# Patient Record
Sex: Male | Born: 1955 | Race: White | Hispanic: No | Marital: Married | Smoking: Never smoker
Health system: Southern US, Community
[De-identification: ages and names within clinical notes are randomized; demographics above are authoritative.]

## PROBLEM LIST (undated history)

## (undated) DIAGNOSIS — R7303 Prediabetes: Secondary | ICD-10-CM

## (undated) DIAGNOSIS — R0600 Dyspnea, unspecified: Secondary | ICD-10-CM

## (undated) DIAGNOSIS — N4340 Spermatocele of epididymis, unspecified: Secondary | ICD-10-CM

## (undated) DIAGNOSIS — N400 Enlarged prostate without lower urinary tract symptoms: Secondary | ICD-10-CM

## (undated) DIAGNOSIS — E785 Hyperlipidemia, unspecified: Secondary | ICD-10-CM

## (undated) DIAGNOSIS — N329 Bladder disorder, unspecified: Secondary | ICD-10-CM

## (undated) DIAGNOSIS — I10 Essential (primary) hypertension: Secondary | ICD-10-CM

## (undated) DIAGNOSIS — D649 Anemia, unspecified: Secondary | ICD-10-CM

## (undated) DIAGNOSIS — R918 Other nonspecific abnormal finding of lung field: Secondary | ICD-10-CM

## (undated) DIAGNOSIS — K219 Gastro-esophageal reflux disease without esophagitis: Secondary | ICD-10-CM

## (undated) DIAGNOSIS — E039 Hypothyroidism, unspecified: Secondary | ICD-10-CM

## (undated) DIAGNOSIS — M1990 Unspecified osteoarthritis, unspecified site: Secondary | ICD-10-CM

## (undated) HISTORY — DX: Hypothyroidism, unspecified: E03.9

## (undated) HISTORY — DX: Spermatocele of epididymis, unspecified: N43.40

## (undated) HISTORY — DX: Benign prostatic hyperplasia without lower urinary tract symptoms: N40.0

## (undated) HISTORY — DX: Hyperlipidemia, unspecified: E78.5

## (undated) HISTORY — DX: Gastro-esophageal reflux disease without esophagitis: K21.9

## (undated) HISTORY — PX: CATARACT EXTRACTION: SUR2

---

## 1981-09-01 DIAGNOSIS — E039 Hypothyroidism, unspecified: Secondary | ICD-10-CM

## 1981-09-01 HISTORY — DX: Hypothyroidism, unspecified: E03.9

## 1987-08-13 ENCOUNTER — Encounter: Payer: Self-pay | Admitting: Family Medicine

## 1987-08-13 LAB — CONVERTED CEMR LAB: Blood Glucose, Fasting: 97 mg/dL

## 1988-06-24 ENCOUNTER — Encounter: Payer: Self-pay | Admitting: Family Medicine

## 1988-06-24 LAB — CONVERTED CEMR LAB
Blood Glucose, Fasting: 105 mg/dL
RBC count: 5.57 10*6/uL
WBC, blood: 6.2 10*3/uL

## 1989-05-14 ENCOUNTER — Encounter: Payer: Self-pay | Admitting: Family Medicine

## 1989-05-14 LAB — CONVERTED CEMR LAB
Blood Glucose, Fasting: 102 mg/dL
RBC count: 5.42 10*6/uL
TSH: 1.42 microintl units/mL
WBC, blood: 5.3 10*3/uL

## 1989-08-01 DIAGNOSIS — N400 Enlarged prostate without lower urinary tract symptoms: Secondary | ICD-10-CM

## 1989-08-01 HISTORY — DX: Benign prostatic hyperplasia without lower urinary tract symptoms: N40.0

## 1989-08-01 HISTORY — PX: CYSTOSCOPY: SUR368

## 1990-08-30 ENCOUNTER — Encounter: Payer: Self-pay | Admitting: Family Medicine

## 1990-08-30 LAB — CONVERTED CEMR LAB
RBC count: 5.56 10*6/uL
WBC, blood: 5.1 10*3/uL

## 1990-08-31 ENCOUNTER — Encounter: Payer: Self-pay | Admitting: Family Medicine

## 1990-08-31 LAB — CONVERTED CEMR LAB
Blood Glucose, Fasting: 88 mg/dL
TSH: 0.68 microintl units/mL

## 1991-10-31 LAB — HM SIGMOIDOSCOPY: HM Sigmoidoscopy: NORMAL

## 1993-08-08 ENCOUNTER — Encounter: Payer: Self-pay | Admitting: Family Medicine

## 1993-08-08 LAB — CONVERTED CEMR LAB
Blood Glucose, Fasting: 101 mg/dL
RBC count: 5.67 10*6/uL
TSH: 0.1 microintl units/mL
WBC, blood: 5 10*3/uL

## 1995-04-20 ENCOUNTER — Encounter: Payer: Self-pay | Admitting: Family Medicine

## 1995-04-20 LAB — CONVERTED CEMR LAB: Blood Glucose, Fasting: 105 mg/dL

## 1995-04-22 ENCOUNTER — Encounter: Payer: Self-pay | Admitting: Family Medicine

## 1995-04-22 LAB — CONVERTED CEMR LAB: PSA: 1 ng/mL

## 1997-08-01 DIAGNOSIS — E785 Hyperlipidemia, unspecified: Secondary | ICD-10-CM

## 1997-08-01 HISTORY — DX: Hyperlipidemia, unspecified: E78.5

## 1997-08-31 ENCOUNTER — Encounter: Payer: Self-pay | Admitting: Family Medicine

## 1997-08-31 LAB — CONVERTED CEMR LAB: TSH: 2.79 microintl units/mL

## 1998-11-27 ENCOUNTER — Encounter: Payer: Self-pay | Admitting: Family Medicine

## 1998-11-27 LAB — CONVERTED CEMR LAB
Blood Glucose, Fasting: 98 mg/dL
TSH: 1.15 microintl units/mL

## 1998-11-29 DIAGNOSIS — K219 Gastro-esophageal reflux disease without esophagitis: Secondary | ICD-10-CM

## 1998-11-29 HISTORY — DX: Gastro-esophageal reflux disease without esophagitis: K21.9

## 1999-10-31 ENCOUNTER — Other Ambulatory Visit: Admission: RE | Admit: 1999-10-31 | Discharge: 1999-10-31 | Payer: Self-pay | Admitting: Gastroenterology

## 1999-10-31 ENCOUNTER — Encounter (INDEPENDENT_AMBULATORY_CARE_PROVIDER_SITE_OTHER): Payer: Self-pay | Admitting: Specialist

## 1999-10-31 HISTORY — PX: ESOPHAGOGASTRODUODENOSCOPY: SHX1529

## 2000-01-08 ENCOUNTER — Encounter: Payer: Self-pay | Admitting: Family Medicine

## 2000-01-08 LAB — CONVERTED CEMR LAB
Blood Glucose, Fasting: 101 mg/dL
TSH: 0.19 microintl units/mL

## 2000-06-04 ENCOUNTER — Encounter: Payer: Self-pay | Admitting: Family Medicine

## 2000-06-04 LAB — CONVERTED CEMR LAB: TSH: 0.77 microintl units/mL

## 2000-08-01 HISTORY — PX: OTHER SURGICAL HISTORY: SHX169

## 2000-08-20 ENCOUNTER — Encounter: Payer: Self-pay | Admitting: Family Medicine

## 2000-08-20 LAB — CONVERTED CEMR LAB
RBC count: 5.36 10*6/uL
WBC, blood: 6.8 10*3/uL

## 2000-08-21 ENCOUNTER — Encounter: Payer: Self-pay | Admitting: Neurosurgery

## 2000-08-21 ENCOUNTER — Ambulatory Visit (HOSPITAL_COMMUNITY): Admission: RE | Admit: 2000-08-21 | Discharge: 2000-08-21 | Payer: Self-pay | Admitting: Neurosurgery

## 2001-05-06 ENCOUNTER — Encounter: Payer: Self-pay | Admitting: Family Medicine

## 2001-05-06 LAB — CONVERTED CEMR LAB
Blood Glucose, Fasting: 98 mg/dL
TSH: 0.7 microintl units/mL

## 2001-09-01 HISTORY — PX: OTHER SURGICAL HISTORY: SHX169

## 2002-05-17 ENCOUNTER — Encounter: Payer: Self-pay | Admitting: Family Medicine

## 2002-05-17 LAB — CONVERTED CEMR LAB
Blood Glucose, Fasting: 99 mg/dL
TSH: 1.18 microintl units/mL

## 2003-03-02 HISTORY — PX: CATARACT EXTRACTION: SUR2

## 2003-04-12 HISTORY — PX: LASIK: SHX215

## 2003-07-04 ENCOUNTER — Encounter: Payer: Self-pay | Admitting: Family Medicine

## 2003-07-04 LAB — CONVERTED CEMR LAB
Blood Glucose, Fasting: 116 mg/dL
TSH: 1.16 microintl units/mL

## 2004-07-02 ENCOUNTER — Ambulatory Visit: Payer: Self-pay | Admitting: Family Medicine

## 2004-07-22 ENCOUNTER — Ambulatory Visit: Payer: Self-pay | Admitting: Family Medicine

## 2004-07-22 LAB — CONVERTED CEMR LAB
Blood Glucose, Fasting: 110 mg/dL
PSA: 0.94 ng/mL
RBC count: 5.58 10*6/uL
TSH: 3.28 microintl units/mL
WBC, blood: 5.4 10*3/uL

## 2004-08-05 ENCOUNTER — Ambulatory Visit: Payer: Self-pay | Admitting: Family Medicine

## 2004-08-14 ENCOUNTER — Ambulatory Visit: Payer: Self-pay | Admitting: Family Medicine

## 2004-10-30 ENCOUNTER — Ambulatory Visit: Payer: Self-pay | Admitting: Family Medicine

## 2005-03-05 ENCOUNTER — Ambulatory Visit: Payer: Self-pay | Admitting: Family Medicine

## 2005-03-07 ENCOUNTER — Ambulatory Visit: Payer: Self-pay | Admitting: Family Medicine

## 2005-07-04 ENCOUNTER — Ambulatory Visit: Payer: Self-pay | Admitting: Family Medicine

## 2005-11-07 ENCOUNTER — Ambulatory Visit: Payer: Self-pay | Admitting: Family Medicine

## 2005-11-18 ENCOUNTER — Ambulatory Visit: Payer: Self-pay | Admitting: Family Medicine

## 2005-11-21 ENCOUNTER — Ambulatory Visit: Payer: Self-pay | Admitting: Family Medicine

## 2006-07-21 ENCOUNTER — Ambulatory Visit: Payer: Self-pay | Admitting: Family Medicine

## 2006-07-21 LAB — CONVERTED CEMR LAB
Blood Glucose, Fasting: 110 mg/dL
PSA: 1.01 ng/mL
TSH: 1.47 microintl units/mL

## 2006-07-24 ENCOUNTER — Ambulatory Visit: Payer: Self-pay | Admitting: Family Medicine

## 2006-11-24 ENCOUNTER — Ambulatory Visit: Payer: Self-pay | Admitting: Family Medicine

## 2007-01-15 ENCOUNTER — Ambulatory Visit: Payer: Self-pay | Admitting: Family Medicine

## 2007-06-02 ENCOUNTER — Encounter: Payer: Self-pay | Admitting: Family Medicine

## 2007-06-24 ENCOUNTER — Ambulatory Visit: Payer: Self-pay | Admitting: Family Medicine

## 2007-09-08 ENCOUNTER — Encounter: Payer: Self-pay | Admitting: Family Medicine

## 2007-09-22 ENCOUNTER — Encounter: Payer: Self-pay | Admitting: Family Medicine

## 2007-09-22 DIAGNOSIS — R7309 Other abnormal glucose: Secondary | ICD-10-CM | POA: Insufficient documentation

## 2007-09-22 DIAGNOSIS — L408 Other psoriasis: Secondary | ICD-10-CM | POA: Insufficient documentation

## 2007-09-23 DIAGNOSIS — K219 Gastro-esophageal reflux disease without esophagitis: Secondary | ICD-10-CM | POA: Insufficient documentation

## 2007-09-23 DIAGNOSIS — E785 Hyperlipidemia, unspecified: Secondary | ICD-10-CM | POA: Insufficient documentation

## 2007-09-23 DIAGNOSIS — E039 Hypothyroidism, unspecified: Secondary | ICD-10-CM | POA: Insufficient documentation

## 2007-09-23 DIAGNOSIS — N400 Enlarged prostate without lower urinary tract symptoms: Secondary | ICD-10-CM | POA: Insufficient documentation

## 2007-12-08 ENCOUNTER — Ambulatory Visit: Payer: Self-pay | Admitting: Family Medicine

## 2007-12-16 ENCOUNTER — Ambulatory Visit: Payer: Self-pay | Admitting: Family Medicine

## 2007-12-22 ENCOUNTER — Ambulatory Visit: Payer: Self-pay | Admitting: Family Medicine

## 2007-12-29 ENCOUNTER — Ambulatory Visit: Payer: Self-pay | Admitting: Family Medicine

## 2008-01-03 ENCOUNTER — Ambulatory Visit: Payer: Self-pay | Admitting: Family Medicine

## 2008-01-03 LAB — CONVERTED CEMR LAB
ALT: 32 units/L (ref 0–53)
AST: 30 units/L (ref 0–37)
Albumin: 3.8 g/dL (ref 3.5–5.2)
Alkaline Phosphatase: 92 units/L (ref 39–117)
BUN: 15 mg/dL (ref 6–23)
Bilirubin, Direct: 0.1 mg/dL (ref 0.0–0.3)
CO2: 30 meq/L (ref 19–32)
Calcium: 9.6 mg/dL (ref 8.4–10.5)
Chloride: 106 meq/L (ref 96–112)
Cholesterol: 124 mg/dL (ref 0–200)
Creatinine, Ser: 1 mg/dL (ref 0.4–1.5)
Creatinine,U: 154.7 mg/dL
GFR calc Af Amer: 100 mL/min
GFR calc non Af Amer: 83 mL/min
Glucose, Bld: 112 mg/dL — ABNORMAL HIGH (ref 70–99)
HDL: 30.5 mg/dL — ABNORMAL LOW (ref >39.0)
LDL Cholesterol: 66 mg/dL (ref 0–99)
Microalb Creat Ratio: 1.3 mg/g (ref 0.0–30.0)
Microalb, Ur: 0.2 mg/dL (ref 0.0–1.9)
PSA: 1.63 ng/mL (ref 0.10–4.00)
Potassium: 4.1 meq/L (ref 3.5–5.1)
Sodium: 141 meq/L (ref 135–145)
T4, Total: 6 ug/dL (ref 5.0–12.5)
TSH: 1.89 microintl units/mL (ref 0.35–5.50)
Total Bilirubin: 0.8 mg/dL (ref 0.3–1.2)
Total CHOL/HDL Ratio: 4.1
Total Protein: 6.9 g/dL (ref 6.0–8.3)
Triglycerides: 136 mg/dL (ref 0–149)
VLDL: 27 mg/dL (ref 0–40)

## 2008-01-05 ENCOUNTER — Ambulatory Visit: Payer: Self-pay | Admitting: Family Medicine

## 2008-01-12 ENCOUNTER — Ambulatory Visit: Payer: Self-pay | Admitting: Family Medicine

## 2008-01-19 ENCOUNTER — Ambulatory Visit: Payer: Self-pay | Admitting: Family Medicine

## 2008-01-26 ENCOUNTER — Ambulatory Visit: Payer: Self-pay | Admitting: Family Medicine

## 2008-01-26 LAB — CONVERTED CEMR LAB
OCCULT 1: NEGATIVE
OCCULT 2: NEGATIVE
OCCULT 3: NEGATIVE

## 2008-01-26 LAB — FECAL OCCULT BLOOD, GUAIAC: Fecal Occult Blood: NEGATIVE

## 2008-02-02 ENCOUNTER — Ambulatory Visit: Payer: Self-pay | Admitting: Family Medicine

## 2008-02-16 ENCOUNTER — Ambulatory Visit: Payer: Self-pay | Admitting: Family Medicine

## 2008-02-22 ENCOUNTER — Telehealth: Payer: Self-pay | Admitting: Family Medicine

## 2008-02-23 ENCOUNTER — Ambulatory Visit: Payer: Self-pay | Admitting: Family Medicine

## 2008-03-01 ENCOUNTER — Ambulatory Visit: Payer: Self-pay | Admitting: Family Medicine

## 2008-03-09 ENCOUNTER — Ambulatory Visit: Payer: Self-pay | Admitting: Family Medicine

## 2008-03-22 ENCOUNTER — Ambulatory Visit: Payer: Self-pay | Admitting: Family Medicine

## 2008-03-29 ENCOUNTER — Ambulatory Visit: Payer: Self-pay | Admitting: Family Medicine

## 2008-04-20 ENCOUNTER — Ambulatory Visit: Payer: Self-pay | Admitting: Family Medicine

## 2008-05-03 ENCOUNTER — Ambulatory Visit: Payer: Self-pay | Admitting: Family Medicine

## 2008-05-11 ENCOUNTER — Ambulatory Visit: Payer: Self-pay | Admitting: Family Medicine

## 2008-05-17 ENCOUNTER — Ambulatory Visit: Payer: Self-pay | Admitting: Family Medicine

## 2008-05-24 ENCOUNTER — Ambulatory Visit: Payer: Self-pay | Admitting: Family Medicine

## 2008-05-31 ENCOUNTER — Ambulatory Visit: Payer: Self-pay | Admitting: Family Medicine

## 2008-06-08 ENCOUNTER — Ambulatory Visit: Payer: Self-pay | Admitting: Family Medicine

## 2008-06-14 ENCOUNTER — Ambulatory Visit: Payer: Self-pay | Admitting: Family Medicine

## 2008-06-22 ENCOUNTER — Ambulatory Visit: Payer: Self-pay | Admitting: Family Medicine

## 2008-06-28 ENCOUNTER — Ambulatory Visit: Payer: Self-pay | Admitting: Family Medicine

## 2008-07-06 ENCOUNTER — Ambulatory Visit: Payer: Self-pay | Admitting: Family Medicine

## 2008-07-12 ENCOUNTER — Ambulatory Visit: Payer: Self-pay | Admitting: Family Medicine

## 2008-08-02 ENCOUNTER — Ambulatory Visit: Payer: Self-pay | Admitting: Family Medicine

## 2008-08-09 ENCOUNTER — Ambulatory Visit: Payer: Self-pay | Admitting: Family Medicine

## 2008-08-16 ENCOUNTER — Ambulatory Visit: Payer: Self-pay | Admitting: Family Medicine

## 2008-08-23 ENCOUNTER — Ambulatory Visit: Payer: Self-pay | Admitting: Family Medicine

## 2008-09-13 ENCOUNTER — Ambulatory Visit: Payer: Self-pay | Admitting: Family Medicine

## 2008-09-20 ENCOUNTER — Ambulatory Visit: Payer: Self-pay | Admitting: Family Medicine

## 2008-09-27 ENCOUNTER — Ambulatory Visit: Payer: Self-pay | Admitting: Family Medicine

## 2008-10-04 ENCOUNTER — Ambulatory Visit: Payer: Self-pay | Admitting: Family Medicine

## 2008-10-06 ENCOUNTER — Ambulatory Visit: Payer: Self-pay | Admitting: Family Medicine

## 2008-10-11 ENCOUNTER — Ambulatory Visit: Payer: Self-pay | Admitting: Family Medicine

## 2008-10-18 ENCOUNTER — Ambulatory Visit: Payer: Self-pay | Admitting: Family Medicine

## 2008-10-25 ENCOUNTER — Ambulatory Visit: Payer: Self-pay | Admitting: Family Medicine

## 2008-11-08 ENCOUNTER — Ambulatory Visit: Payer: Self-pay | Admitting: Family Medicine

## 2008-11-15 ENCOUNTER — Ambulatory Visit: Payer: Self-pay | Admitting: Family Medicine

## 2008-11-22 ENCOUNTER — Ambulatory Visit: Payer: Self-pay | Admitting: Family Medicine

## 2008-11-30 ENCOUNTER — Ambulatory Visit: Payer: Self-pay | Admitting: Family Medicine

## 2008-12-06 ENCOUNTER — Ambulatory Visit: Payer: Self-pay | Admitting: Family Medicine

## 2008-12-13 ENCOUNTER — Ambulatory Visit: Payer: Self-pay | Admitting: Family Medicine

## 2008-12-20 ENCOUNTER — Ambulatory Visit: Payer: Self-pay | Admitting: Family Medicine

## 2008-12-27 ENCOUNTER — Ambulatory Visit: Payer: Self-pay | Admitting: Family Medicine

## 2009-01-10 ENCOUNTER — Ambulatory Visit: Payer: Self-pay | Admitting: Family Medicine

## 2009-01-11 ENCOUNTER — Encounter: Payer: Self-pay | Admitting: Family Medicine

## 2009-01-17 ENCOUNTER — Ambulatory Visit: Payer: Self-pay | Admitting: Family Medicine

## 2009-01-24 ENCOUNTER — Ambulatory Visit: Payer: Self-pay | Admitting: Family Medicine

## 2009-01-31 ENCOUNTER — Ambulatory Visit: Payer: Self-pay | Admitting: Family Medicine

## 2009-02-07 ENCOUNTER — Ambulatory Visit: Payer: Self-pay | Admitting: Family Medicine

## 2009-02-28 ENCOUNTER — Ambulatory Visit: Payer: Self-pay | Admitting: Family Medicine

## 2009-03-07 ENCOUNTER — Ambulatory Visit: Payer: Self-pay | Admitting: Family Medicine

## 2009-03-14 ENCOUNTER — Ambulatory Visit: Payer: Self-pay | Admitting: Family Medicine

## 2009-03-22 ENCOUNTER — Ambulatory Visit: Payer: Self-pay | Admitting: Family Medicine

## 2009-03-26 ENCOUNTER — Ambulatory Visit: Payer: Self-pay | Admitting: Family Medicine

## 2009-03-26 LAB — CONVERTED CEMR LAB
ALT: 24 units/L (ref 0–53)
AST: 28 units/L (ref 0–37)
Albumin: 3.9 g/dL (ref 3.5–5.2)
Alkaline Phosphatase: 84 units/L (ref 39–117)
BUN: 18 mg/dL (ref 6–23)
Basophils Absolute: 0 10*3/uL (ref 0.0–0.1)
Basophils Relative: 0.3 % (ref 0.0–3.0)
Bilirubin, Direct: 0.1 mg/dL (ref 0.0–0.3)
CO2: 29 meq/L (ref 19–32)
Calcium: 9.2 mg/dL (ref 8.4–10.5)
Chloride: 104 meq/L (ref 96–112)
Cholesterol: 130 mg/dL (ref 0–200)
Creatinine, Ser: 1.1 mg/dL (ref 0.4–1.5)
Eosinophils Absolute: 0.3 10*3/uL (ref 0.0–0.7)
Eosinophils Relative: 4.4 % (ref 0.0–5.0)
Free T4: 1 ng/dL (ref 0.6–1.6)
GFR calc non Af Amer: 73.75 mL/min (ref >60)
Glucose, Bld: 107 mg/dL — ABNORMAL HIGH (ref 70–99)
HCT: 45.3 % (ref 39.0–52.0)
HDL: 37.2 mg/dL — ABNORMAL LOW (ref >39.00)
Hemoglobin: 15.5 g/dL (ref 13.0–17.0)
LDL Cholesterol: 73 mg/dL (ref 0–99)
Lymphocytes Relative: 24.5 % (ref 12.0–46.0)
Lymphs Abs: 1.4 10*3/uL (ref 0.7–4.0)
MCHC: 34.2 g/dL (ref 30.0–36.0)
MCV: 88.7 fL (ref 78.0–100.0)
Monocytes Absolute: 0.4 10*3/uL (ref 0.1–1.0)
Monocytes Relative: 6.8 % (ref 3.0–12.0)
Neutro Abs: 3.7 10*3/uL (ref 1.4–7.7)
Neutrophils Relative %: 64 % (ref 43.0–77.0)
PSA: 1.03 ng/mL (ref 0.10–4.00)
Platelets: 163 10*3/uL (ref 150.0–400.0)
Potassium: 3.9 meq/L (ref 3.5–5.1)
RBC: 5.11 M/uL (ref 4.22–5.81)
RDW: 12.8 % (ref 11.5–14.6)
Sodium: 139 meq/L (ref 135–145)
TSH: 0.92 microintl units/mL (ref 0.35–5.50)
Total Bilirubin: 1.3 mg/dL — ABNORMAL HIGH (ref 0.3–1.2)
Total CHOL/HDL Ratio: 3
Total Protein: 7.1 g/dL (ref 6.0–8.3)
Triglycerides: 101 mg/dL (ref 0.0–149.0)
VLDL: 20.2 mg/dL (ref 0.0–40.0)
Vitamin B-12: 178 pg/mL — ABNORMAL LOW (ref 211–911)
WBC: 5.8 10*3/uL (ref 4.5–10.5)

## 2009-03-27 ENCOUNTER — Ambulatory Visit: Payer: Self-pay | Admitting: Family Medicine

## 2009-03-27 DIAGNOSIS — M545 Low back pain, unspecified: Secondary | ICD-10-CM | POA: Insufficient documentation

## 2009-03-27 LAB — CONVERTED CEMR LAB
Creatinine,U: 37.2 mg/dL
Microalb Creat Ratio: 2.7 mg/g (ref 0.0–30.0)
Microalb, Ur: 0.1 mg/dL (ref 0.0–1.9)
Vit D, 25-Hydroxy: 28 ng/mL — ABNORMAL LOW (ref 30–89)

## 2009-04-05 ENCOUNTER — Ambulatory Visit: Payer: Self-pay | Admitting: Family Medicine

## 2009-04-18 ENCOUNTER — Ambulatory Visit: Payer: Self-pay | Admitting: Family Medicine

## 2009-04-25 ENCOUNTER — Ambulatory Visit: Payer: Self-pay | Admitting: Family Medicine

## 2009-08-10 ENCOUNTER — Encounter: Payer: Self-pay | Admitting: Family Medicine

## 2009-08-10 HISTORY — PX: COLONOSCOPY: SHX174

## 2009-08-10 LAB — HM COLONOSCOPY

## 2009-09-19 ENCOUNTER — Encounter: Payer: Self-pay | Admitting: Family Medicine

## 2010-01-03 ENCOUNTER — Encounter: Payer: Self-pay | Admitting: Family Medicine

## 2010-01-14 ENCOUNTER — Ambulatory Visit: Payer: Self-pay | Admitting: Family Medicine

## 2010-02-05 ENCOUNTER — Telehealth: Payer: Self-pay | Admitting: Family Medicine

## 2010-02-07 ENCOUNTER — Encounter: Payer: Self-pay | Admitting: Family Medicine

## 2010-03-08 ENCOUNTER — Encounter: Payer: Self-pay | Admitting: Family Medicine

## 2010-04-04 ENCOUNTER — Encounter (INDEPENDENT_AMBULATORY_CARE_PROVIDER_SITE_OTHER): Payer: Self-pay | Admitting: *Deleted

## 2010-05-29 ENCOUNTER — Ambulatory Visit: Payer: Self-pay | Admitting: Internal Medicine

## 2010-06-14 ENCOUNTER — Ambulatory Visit: Payer: Self-pay | Admitting: Internal Medicine

## 2010-06-20 ENCOUNTER — Encounter: Payer: Self-pay | Admitting: Family Medicine

## 2010-06-21 ENCOUNTER — Encounter: Payer: Self-pay | Admitting: Family Medicine

## 2010-07-07 ENCOUNTER — Encounter: Payer: Self-pay | Admitting: Family Medicine

## 2010-07-21 DIAGNOSIS — R002 Palpitations: Secondary | ICD-10-CM | POA: Insufficient documentation

## 2010-07-22 ENCOUNTER — Ambulatory Visit: Payer: Self-pay | Admitting: Family Medicine

## 2010-07-23 LAB — CONVERTED CEMR LAB
ALT: 32 units/L (ref 0–53)
AST: 32 units/L (ref 0–37)
Albumin: 4.4 g/dL (ref 3.5–5.2)
Alkaline Phosphatase: 88 units/L (ref 39–117)
BUN: 17 mg/dL (ref 6–23)
Basophils Absolute: 0 10*3/uL (ref 0.0–0.1)
Basophils Relative: 0.6 % (ref 0.0–3.0)
Bilirubin, Direct: 0.2 mg/dL (ref 0.0–0.3)
CO2: 26 meq/L (ref 19–32)
Calcium: 9.6 mg/dL (ref 8.4–10.5)
Chloride: 104 meq/L (ref 96–112)
Cholesterol: 155 mg/dL (ref 0–200)
Creatinine, Ser: 1.2 mg/dL (ref 0.4–1.5)
Creatinine,U: 140.4 mg/dL
Eosinophils Absolute: 0.3 10*3/uL (ref 0.0–0.7)
Eosinophils Relative: 5.2 % — ABNORMAL HIGH (ref 0.0–5.0)
Free T4: 1.04 ng/dL (ref 0.60–1.60)
GFR calc non Af Amer: 69.73 mL/min (ref >60)
Glucose, Bld: 134 mg/dL — ABNORMAL HIGH (ref 70–99)
HCT: 47.9 % (ref 39.0–52.0)
HDL: 40.2 mg/dL (ref >39.00)
Hemoglobin: 16.9 g/dL (ref 13.0–17.0)
Hgb A1c MFr Bld: 5.7 % (ref 4.6–6.5)
LDL Cholesterol: 100 mg/dL — ABNORMAL HIGH (ref 0–99)
Lymphocytes Relative: 33.9 % (ref 12.0–46.0)
Lymphs Abs: 2.1 10*3/uL (ref 0.7–4.0)
MCHC: 35.2 g/dL (ref 30.0–36.0)
MCV: 88.4 fL (ref 78.0–100.0)
Magnesium: 1.9 mg/dL (ref 1.5–2.5)
Microalb Creat Ratio: 0.4 mg/g (ref 0.0–30.0)
Microalb, Ur: 0.6 mg/dL (ref 0.0–1.9)
Monocytes Absolute: 0.4 10*3/uL (ref 0.1–1.0)
Monocytes Relative: 6.7 % (ref 3.0–12.0)
Neutro Abs: 3.3 10*3/uL (ref 1.4–7.7)
Neutrophils Relative %: 53.6 % (ref 43.0–77.0)
PSA: 0.85 ng/mL (ref 0.10–4.00)
Platelets: 176 10*3/uL (ref 150.0–400.0)
Potassium: 4.4 meq/L (ref 3.5–5.1)
RBC: 5.42 M/uL (ref 4.22–5.81)
RDW: 13.3 % (ref 11.5–14.6)
Sodium: 141 meq/L (ref 135–145)
TSH: 0.63 microintl units/mL (ref 0.35–5.50)
Total Bilirubin: 1.4 mg/dL — ABNORMAL HIGH (ref 0.3–1.2)
Total CHOL/HDL Ratio: 4
Total Protein: 7.2 g/dL (ref 6.0–8.3)
Triglycerides: 74 mg/dL (ref 0.0–149.0)
VLDL: 14.8 mg/dL (ref 0.0–40.0)
Vit D, 25-Hydroxy: 33 ng/mL (ref 30–89)
WBC: 6.2 10*3/uL (ref 4.5–10.5)

## 2010-07-24 ENCOUNTER — Ambulatory Visit: Payer: Self-pay | Admitting: Family Medicine

## 2010-07-24 ENCOUNTER — Encounter: Payer: Self-pay | Admitting: Family Medicine

## 2010-09-23 ENCOUNTER — Encounter: Payer: Self-pay | Admitting: Family Medicine

## 2010-10-01 NOTE — Assessment & Plan Note (Signed)
Summary: ALLERGY SHOT/[facility]   Nurse Visit    Prior Medications: PROTONIX 40 MG TBEC (PANTOPRAZOLE SODIUM) 1 DAILY BY MOUTH LEVOXYL 88 MCG TABS (LEVOTHYROXINE SODIUM) 1 TABLET DAILY BY MOUTH VYTORIN 10-20 MG TABS (EZETIMIBE-SIMVASTATIN) 1 TABLET AT BEDTIME BY MOUTH ASTELIN 137 MCG/SPRAY  SOLN (AZELASTINE HCL)  OMNARIS 50 MCG/ACT  SUSP (CICLESONIDE)  SINGULAIR 10 MG  TABS (MONTELUKAST SODIUM) 1 DAILY BY MOUTH CLARINEX 5 MG  TABS (DESLORATADINE) 1 DAILY BY MOUTH LYRICA 75 MG  CAPS (PREGABALIN) one tab by mouth tid Current Allergies: ! PENICILLIN ! SULFA ! KEFLEX    Medication Administration  Injection # 1:    Medication: Allergy Injection (1)    Diagnosis: ALLERGIC RHINITIS, CHRONIC SEASONAL AND PERENNIAL (ICD-477.9)    Route: SQ    Site: R deltoid    Exp Date: 02/27/2009    Lot #: T-G-W//MOLD    Mfr: [name]    Patient tolerated injection without complications    Given by: Burna Mortimer Duby (August 02, 2008 12:51 PM)  Orders Added: 1)  Admin of Therapeutic Inj  intramuscular or subcutaneous [96372] 2)  Admin of patients own med IM/SQ Pansy.Snowball    ]  Medication Administration  Injection # 1:    Medication: Allergy Injection (1)    Diagnosis: ALLERGIC RHINITIS, CHRONIC SEASONAL AND PERENNIAL (ICD-477.9)    Route: SQ    Site: R deltoid    Exp Date: 02/27/2009    Lot #: T-G-W//MOLD    Mfr: [facility]    Patient tolerated injection without complications    Given by: Burna Mortimer Hubbard (August 02, 2008 12:51 PM)  Orders Added: 1)  Admin of Therapeutic Inj  intramuscular or subcutaneous [[id number]] 2)  Admin of patients own med IM/SQ [[id number]]

## 2010-10-01 NOTE — Assessment & Plan Note (Signed)
Summary: allergy shots/[facility]   Nurse Visit   Allergies: 1)  ! Penicillin 2)  ! Sulfa 3)  ! Keflex  Medication Administration  Injection # 1:    Medication: Allergy Injection (1)    Diagnosis: ALLERGIC RHINITIS, CHRONIC SEASONAL AND PERENNIAL (ICD-477.9)    Route: SQ    Site: R deltoid    Exp Date: 03/09/2010    Lot #: tgw//mold    Mfr: [place] allergy    Patient tolerated injection without complications    Given by: Mannie Ohlin LPN (April 05, 2009 3:02 PM)  Orders Added: 1)  Admin of Therapeutic Inj  intramuscular or subcutaneous [[id number]]   Medication Administration  Injection # 1:    Medication: Allergy Injection (1)    Diagnosis: ALLERGIC RHINITIS, CHRONIC SEASONAL AND PERENNIAL (ICD-477.9)    Route: SQ    Site: R deltoid    Exp Date: 03/09/2010    Lot #: tgw//mold    Mfr: [place] allergy    Patient tolerated injection without complications    Given by: Woodard Perrell LPN (April 05, 2009 3:02 PM)  Orders Added: 1)  Admin of Therapeutic Inj  intramuscular or subcutaneous [[id number]]

## 2010-10-01 NOTE — Assessment & Plan Note (Signed)
Summary: allergy shots/[facility]   Nurse Visit    Prior Medications: PROTONIX 40 MG TBEC (PANTOPRAZOLE SODIUM) 1 DAILY BY MOUTH LEVOXYL 88 MCG TABS (LEVOTHYROXINE SODIUM) 1 TABLET DAILY BY MOUTH VYTORIN 10-20 MG TABS (EZETIMIBE-SIMVASTATIN) 1 TABLET AT BEDTIME BY MOUTH ASTELIN 137 MCG/SPRAY  SOLN (AZELASTINE HCL)  OMNARIS 50 MCG/ACT  SUSP (CICLESONIDE)  SINGULAIR 10 MG  TABS (MONTELUKAST SODIUM) 1 DAILY BY MOUTH CLARINEX 5 MG  TABS (DESLORATADINE) 1 DAILY BY MOUTH LYRICA 75 MG  CAPS (PREGABALIN) one tab by mouth tid Current Allergies: ! PENICILLIN ! SULFA ! KEFLEX    Medication Administration  Injection # 1:    Medication: Allergy Injection (1)    Diagnosis: ALLERGIC RHINITIS, CHRONIC SEASONAL AND PERENNIAL (ICD-477.9)    Route: SQ    Site: R deltoid    Exp Date: 02/27/2009    Lot #: T-G-W/MOLD    Mfr: [place] ALLERGY    Comments: 0.3ML SUBQU RIGHT DELTOID MOLD AND O.3 ML OF T-G-W    Patient tolerated injection without complications    Given by: Burna Mortimer Righetti (June 28, 2008 11:02 AM)  Orders Added: 1)  Admin of Therapeutic Inj  intramuscular or subcutaneous [[id number]] 2)  Admin of patients own med IM/SQ Pansy.Snowball    ]  Medication Administration  Injection # 1:    Medication: Allergy Injection (1)    Diagnosis: ALLERGIC RHINITIS, CHRONIC SEASONAL AND PERENNIAL (ICD-477.9)    Route: SQ    Site: R deltoid    Exp Date: 02/27/2009    Lot #: T-G-W/MOLD    Mfr: [place] ALLERGY    Comments: 0.3ML SUBQU RIGHT DELTOID MOLD AND O.3 ML OF T-G-W    Patient tolerated injection without complications    Given by: Burna Mortimer Soliman (June 28, 2008 11:02 AM)  Orders Added: 1)  Admin of Therapeutic Inj  intramuscular or subcutaneous [96372] 2)  Admin of patients own med IM/SQ [[id number]]

## 2010-10-01 NOTE — Assessment & Plan Note (Signed)
Summary: NECK PAIN/[facility]   Vital Signs:  Patient profile:   55 year old male Weight:      185.25 pounds Temp:     98.4 degrees F oral Pulse rate:   86 / minute Pulse rhythm:   regular BP sitting:   118 / 80  (left arm) Cuff size:   large  Vitals Entered By: Selena Batten Dance CMA Duncan Dull) (May 29, 2010 9:11 AM) CC: Neck Pain x1 month   History of Present Illness: CC: neck pain  DOI: August 26th.  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  Worse when looking left and at night.  Looking right ok.  Pain starts in middle and runs down to left shoulder.  No shooting pain down arm, no paresthesias in arms/fingers.  Has tried alleve (2 tab in AM, 2 in PM for 1 month, pretty consistently).  If holding head straight, feels better.  h/o back surgery - had discectomy 10 years ago.  Still with residual numbness in R foot.  No smoking.  Work knows about injury, pt brings sheet in today.  Current Medications (verified): 1)  Protonix 40 Mg Tbec (Pantoprazole Sodium) .[name].. 1 Daily By Mouth 2)  Levoxyl 88 Mcg Tabs (Levothyroxine Sodium) .[name].. 1 Tablet Daily By Mouth 3)  Vytorin 10-20 Mg Tabs (Ezetimibe-Simvastatin) .[name].. 1 Tablet At Bedtime By Mouth 4)  Omnaris 50 Mcg/act  Susp (Ciclesonide) .[name].. 1 Spray Each Nostril As Needed 5)  Clarinex 5 Mg  Tabs (Desloratadine) .[name].. 1 Daily By Mouth As Needed 6)  B Complex-C  Caps (B Complex-C) .... One A Day 7)  Vitamin C 500 Mg Tabs (Ascorbic Acid) .... One A Day  Allergies: 1)  ! Penicillin 2)  ! Sulfa 3)  ! Keflex  Past History:  Past Medical History: Last updated: 03/14/2010 GERD:(11/29/1998) Hyperlipidemia:(08/1997) Hypothyroidism:(1983) Benign prostatic hypertrophy:(08/1989) Spermatocele per Dr. Annabell Howells with [facility]  Past Surgical History: Last updated: 08/10/2009 gallbladder ULTRASOUND NORMAL:(04/1987) CYSTOSCOPY ; NORMAL :(08/1989) FLEX SIG; NORMAL:(10/1991) EGD; DISTAL ESOPH.  STRICTURE ; SLIDING H.H.:(10/1999) LUMBAR MICRODISECTOMY :(DR. 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 Msk:  + max tenderness midline along C6-7, + L>R cervical paraspinous tenderness, no evident spasm/tightening of trapezius.  Negative spurling.  Negative empty can sign.  decreased ROM adduction at neck on L compared to right otherwise FROM at neck.  FROM at shoulders.  SITS intact strength Pulses:  2+ rad pulses Extremities:  no edema Neurologic:  sensory, motor intact BUE.  2+ DTRs biceps and brachioradialis bilaterally, 1+ triceps bilaterally Skin:  turgor normal, color normal, and no rashes.     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  Baseline xrays today.  treat conservatively for now with relaxant and anti inflammatory for next few weeks.  If no improvement, consider physical therapy.  red flags to return sooner discussed.  Orders: T-Cervicle Spine 2-3 Views [phone])  His updated medication list for this problem includes:    Naprosyn 500 Mg Tabs (Naproxen) .[name]... Take one  by mouth two times a day with food x 10 days then as needed    Flexeril 10 Mg Tabs (Cyclobenzaprine hcl) .[name]... Take one by mouth two times a day as needed muscle spasm sedation precautions  Complete Medication List: 1)  Protonix 40 Mg Tbec (Pantoprazole sodium) .[name].. 1 daily by mouth 2)  Levoxyl 88 Mcg Tabs (Levothyroxine sodium) .[name].. 1 tablet daily by mouth 3)  Vytorin  10-20 Mg Tabs (Ezetimibe-simvastatin) .[name].. 1 tablet at bedtime by mouth 4)  Omnaris 50 Mcg/act Susp (Ciclesonide) .[name].. 1 spray each nostril as needed 5)  Clarinex 5 Mg Tabs (Desloratadine) .[name].. 1 daily by mouth as needed 6)  B Complex-c Caps (B complex-c) .... One a day 7)  Vitamin C 500 Mg Tabs (Ascorbic acid) .... One a day 8)  Naprosyn 500 Mg Tabs (Naproxen) .... Take one by mouth two times a day with food x 10 days then as needed 9)  Flexeril 10 Mg Tabs (Cyclobenzaprine hcl) .... Take one by mouth two times a day as needed muscle spasm sedation precautions  Patient Instructions: 1)  Please return in 2 weeks for follow up. 2)  You either have a cervical strain or possibly disc herniation.   3)  treat conservatively for now with relaxant and anti inflammatory (sent to pharmacy) for next few weeks.   4)  If no improvement, we will send you for physical therapy.   5)  Please return sooner if any numbness in arms/hands or shooting pain down arms or worsening instead of improving. 6)  Good to meet you today, call clinic with questions. Prescriptions: FLEXERIL 10 MG TABS (CYCLOBENZAPRINE HCL) take one by mouth two times a day as needed muscle spasm sedation precautions  #30 x 0   Entered and Authorized by:   Eustaquio Boyden  MD   Signed by:   Eustaquio Boyden  MD on 05/29/2010   Method used:   Electronically to        [facility] (retail)       [address]       [place], [place]  [address]       Ph: [phone]       Fax: [phone]   RxID:   [id number] NAPROSYN 500 MG TABS (NAPROXEN) take one by mouth two times a day with food x 10 days then as needed  #30 x 0   Entered and Authorized by:   Eustaquio Boyden  MD   Signed by:   Eustaquio Boyden  MD on 05/29/2010   Method used:   Electronically to        [facility] (retail)       [address]       [place], [place]  [address]       Ph: [phone]        Fax: [phone]   RxID:   [id number]   Current Allergies (reviewed today): ! PENICILLIN ! SULFA ! KEFLEX

## 2010-10-01 NOTE — Consult Note (Signed)
Summary: Dr.Sharma,Allergy,Note  Dr.Sharma,Allergy,Note   Imported By: Beau Fanny 09/23/2007 08:24:31  _____________________________________________________________________  External Attachment:    Type:   Image     Comment:   External Document

## 2010-10-01 NOTE — Assessment & Plan Note (Signed)
Summary: allergy shots/[facility]   Nurse Visit     Allergies: 1)  ! Penicillin 2)  ! Sulfa 3)  ! Keflex     Medication Administration  Injection # 1:    Medication: Allergy Injection (1)    Diagnosis: ALLERGIC RHINITIS, CHRONIC SEASONAL AND PERENNIAL (ICD-477.9)    Route: SQ    Site: L deltoid    Exp Date: 08/08/2009    Lot #: tgw/mold    Mfr: [place]    Patient tolerated injection without complications    Given by: Burna Mortimer Kaiser (January 31, 2009 2:41 PM)  Orders Added: 1)  Admin of Therapeutic Inj  intramuscular or subcutaneous [[id number]]      Medication Administration  Injection # 1:    Medication: Allergy Injection (1)    Diagnosis: ALLERGIC RHINITIS, CHRONIC SEASONAL AND PERENNIAL (ICD-477.9)    Route: SQ    Site: L deltoid    Exp Date: 08/08/2009    Lot #: tgw/mold    Mfr: [place]    Patient tolerated injection without complications    Given by: Burna Mortimer Pilot (January 31, 2009 2:41 PM)  Orders Added: 1)  Admin of Therapeutic Inj  intramuscular or subcutaneous [[id number]]

## 2010-10-01 NOTE — Assessment & Plan Note (Signed)
Summary: allergy shot/[facility]   Nurse Visit     Allergies: 1)  ! Penicillin 2)  ! Sulfa 3)  ! Keflex     Medication Administration  Injection # 1:    Medication: Allergy Injection (1)    Diagnosis: ALLERGIC RHINITIS, CHRONIC SEASONAL AND PERENNIAL (ICD-477.9)    Route: SQ    Site: L deltoid    Exp Date: 08/08/2009    Lot #: t-g-w/mold    Mfr: [place] allergy    Patient tolerated injection without complications    Given by: Burna Mortimer Redler (February 07, 2009 4:50 PM)  Orders Added: 1)  Admin of Therapeutic Inj  intramuscular or subcutaneous [[id number]]      Medication Administration  Injection # 1:    Medication: Allergy Injection (1)    Diagnosis: ALLERGIC RHINITIS, CHRONIC SEASONAL AND PERENNIAL (ICD-477.9)    Route: SQ    Site: L deltoid    Exp Date: 08/08/2009    Lot #: t-g-w/mold    Mfr: [place] allergy    Patient tolerated injection without complications    Given by: Burna Mortimer Kalter (February 07, 2009 4:50 PM)  Orders Added: 1)  Admin of Therapeutic Inj  intramuscular or subcutaneous [[id number]]

## 2010-10-01 NOTE — Progress Notes (Signed)
Summary: PHONE NOTE LYRICA  Phone Note Call from Patient   Caller: Patient Call For: dr. Hetty Ely Summary of Call: states you said something about increasing his lyrica  IT IS HELPING LOWER BACK PAIN // BUT NOT HIS LEG //SORRY TO BOTHER YOU USES  [facility] IN [phone]/  ITS WHAT EVER YOU THINK DR. Khalie Wince . Initial call taken by: Providence Crosby,  February 22, 2008 12:43 PM  Follow-up for Phone Call        Have him increase by going 75 AM, 50 next two doses for a few days, then 75 AM and midday, 50 PM, then 75  three times a day . Follow-up by: Shaune Leeks MD,  February 23, 2008 9:41 AM  Additional Follow-up for Phone Call Additional follow up Details #1::        med phoned to pharmacy. Additional Follow-up by: Cooper Render,  February 24, 2008 9:37 AM    New/Updated Medications: LYRICA 75 MG  CAPS (PREGABALIN) one tab by mouth tid   Prescriptions: LYRICA 75 MG  CAPS (PREGABALIN) one tab by mouth tid  #90 x 12   Entered and Authorized by:   Shaune Leeks MD   Signed by:   Shaune Leeks MD on 02/23/2008   Method used:   Electronically sent to ...       [facility]       [address]       [place], [place]  [address]       Ph: [phone]       Fax: [phone]   RxID:   [id number]

## 2010-10-01 NOTE — Assessment & Plan Note (Signed)
Summary: allergy shots/[facility]   Nurse Visit    Prior Medications: PROTONIX 40 MG TBEC (PANTOPRAZOLE SODIUM) 1 DAILY BY MOUTH LEVOXYL 88 MCG TABS (LEVOTHYROXINE SODIUM) 1 TABLET DAILY BY MOUTH VYTORIN 10-20 MG TABS (EZETIMIBE-SIMVASTATIN) 1 TABLET AT BEDTIME BY MOUTH ASTELIN 137 MCG/SPRAY  SOLN (AZELASTINE HCL)  OMNARIS 50 MCG/ACT  SUSP (CICLESONIDE)  SINGULAIR 10 MG  TABS (MONTELUKAST SODIUM) 1 DAILY BY MOUTH CLARINEX 5 MG  TABS (DESLORATADINE) 1 DAILY BY MOUTH LYRICA 50 MG  CAPS (PREGABALIN) one cap by mouth three times a day Current Allergies: ! PENICILLIN ! SULFA ! KEFLEX    Medication Administration  Injection # 1:    Medication: Allergy Injection (1)    Diagnosis: ALLERGIC RHINITIS, CHRONIC SEASONAL AND PERENNIAL (ICD-477.9)    Route: SQ    Site: L deltoid    Exp Date: 09/09/2008    Lot #: 359    Mfr: [place]    Comments: 0.74mlleft deltoid and mold 0.77ml right deltoid    Patient tolerated injection without complications    Given by: Burna Mortimer Popson (Jan 19, 2008 2:45 PM)  Orders Added: 1)  Admin of Therapeutic Inj  intramuscular or subcutaneous Lepidus.Putnam    ]  Medication Administration  Injection # 1:    Medication: Allergy Injection (1)    Diagnosis: ALLERGIC RHINITIS, CHRONIC SEASONAL AND PERENNIAL (ICD-477.9)    Route: SQ    Site: L deltoid    Exp Date: 09/09/2008    Lot #: 359    Mfr: [place]    Comments: 0.55mlleft deltoid and mold 0.71ml right deltoid    Patient tolerated injection without complications    Given by: Burna Mortimer Kittrell (Jan 19, 2008 2:45 PM)  Orders Added: 1)  Admin of Therapeutic Inj  intramuscular or subcutaneous [[id number]]

## 2010-10-01 NOTE — Assessment & Plan Note (Signed)
Summary: ALLERGY SHOTS/[facility]   Nurse Visit    Prior Medications: PROTONIX 40 MG TBEC (PANTOPRAZOLE SODIUM) 1 DAILY BY MOUTH LEVOXYL 88 MCG TABS (LEVOTHYROXINE SODIUM) 1 TABLET DAILY BY MOUTH VYTORIN 10-20 MG TABS (EZETIMIBE-SIMVASTATIN) 1 TABLET AT BEDTIME BY MOUTH ASTELIN 137 MCG/SPRAY  SOLN (AZELASTINE HCL)  OMNARIS 50 MCG/ACT  SUSP (CICLESONIDE)  SINGULAIR 10 MG  TABS (MONTELUKAST SODIUM) 1 DAILY BY MOUTH CLARINEX 5 MG  TABS (DESLORATADINE) 1 DAILY BY MOUTH LYRICA 75 MG  CAPS (PREGABALIN) one tab by mouth tid Current Allergies: ! PENICILLIN ! SULFA ! KEFLEX    Medication Administration  Injection # 1:    Medication: Allergy Injection (1)    Diagnosis: ALLERGIC RHINITIS, CHRONIC SEASONAL AND PERENNIAL (ICD-477.9)    Route: SQ    Site: R deltoid    Exp Date: 02/27/2009    Lot #: MOLD/ TGW    Mfr: [place] ALLERGY    Comments: 0.3ML OF MOLD IN RIGHT DELTOID// 0.3ML OFT-G-W IN RIGHT DELTOID    Patient tolerated injection without complications    Given by: Burna Mortimer Terrance (March 01, 2008 10:36 AM)  Orders Added: 1)  Admin of Therapeutic Inj  intramuscular or subcutaneous Lepidus.Putnam    ]  Medication Administration  Injection # 1:    Medication: Allergy Injection (1)    Diagnosis: ALLERGIC RHINITIS, CHRONIC SEASONAL AND PERENNIAL (ICD-477.9)    Route: SQ    Site: R deltoid    Exp Date: 02/27/2009    Lot #: MOLD/ TGW    Mfr: [place] ALLERGY    Comments: 0.3ML OF MOLD IN RIGHT DELTOID// 0.3ML OFT-G-W IN RIGHT DELTOID    Patient tolerated injection without complications    Given by: Burna Mortimer Lovins (March 01, 2008 10:36 AM)  Orders Added: 1)  Admin of Therapeutic Inj  intramuscular or subcutaneous [[id number]]

## 2010-10-01 NOTE — Assessment & Plan Note (Signed)
Summary: ALLERGY SHOT/[facility]   Nurse Visit    Prior Medications: PROTONIX 40 MG TBEC (PANTOPRAZOLE SODIUM) 1 DAILY BY MOUTH LEVOXYL 88 MCG TABS (LEVOTHYROXINE SODIUM) 1 TABLET DAILY BY MOUTH VYTORIN 10-20 MG TABS (EZETIMIBE-SIMVASTATIN) 1 TABLET AT BEDTIME BY MOUTH ASTELIN 137 MCG/SPRAY  SOLN (AZELASTINE HCL)  OMNARIS 50 MCG/ACT  SUSP (CICLESONIDE)  SINGULAIR 10 MG  TABS (MONTELUKAST SODIUM) 1 DAILY BY MOUTH CLARINEX 5 MG  TABS (DESLORATADINE) 1 DAILY BY MOUTH LYRICA 75 MG  CAPS (PREGABALIN) one tab by mouth tid Current Allergies: ! PENICILLIN ! SULFA ! KEFLEX    Medication Administration  Injection # 1:    Medication: Allergy Injection (1)    Diagnosis: ALLERGIC RHINITIS, CHRONIC SEASONAL AND PERENNIAL (ICD-477.9)    Route: SQ    Site: R deltoid    Exp Date: 02/27/2009    Lot #: T-G-W// MOLD    Mfr: [place] ALLERGY    Comments: 0.3ML MOLD IN RIGHT ARM// NO REACTION 0.3MLS T-G-W- IN LEFT ARM WITH NO REACTION    Patient tolerated injection without complications    Given by: Burna Mortimer Baldini (March 22, 2008 12:06 PM)  Orders Added: 1)  Admin of Therapeutic Inj  intramuscular or subcutaneous Lepidus.Putnam    ]  Medication Administration  Injection # 1:    Medication: Allergy Injection (1)    Diagnosis: ALLERGIC RHINITIS, CHRONIC SEASONAL AND PERENNIAL (ICD-477.9)    Route: SQ    Site: R deltoid    Exp Date: 02/27/2009    Lot #: T-G-W// MOLD    Mfr: [place] ALLERGY    Comments: 0.3ML MOLD IN RIGHT ARM// NO REACTION 0.3MLS T-G-W- IN LEFT ARM WITH NO REACTION    Patient tolerated injection without complications    Given by: Burna Mortimer Mackowiak (March 22, 2008 12:06 PM)  Orders Added: 1)  Admin of Therapeutic Inj  intramuscular or subcutaneous [[id number]]

## 2010-10-01 NOTE — Assessment & Plan Note (Signed)
Summary: allergie shots/[facility]   Nurse Visit     Allergies: 1)  ! Penicillin 2)  ! Sulfa 3)  ! Keflex     Medication Administration  Injection # 1:    Medication: Allergy Injection (1)    Diagnosis: ALLERGIC RHINITIS, CHRONIC SEASONAL AND PERENNIAL (ICD-477.9)    Route: SQ    Site: R deltoid    Exp Date: 08/18/2009    Lot #: TGW/MOLD    Mfr: [place]    Patient tolerated injection without complications    Given by: Burna Mortimer Schnorr (Jan 18, 2009 12:56 PM)  Orders Added: 1)  Admin of Therapeutic Inj  intramuscular or subcutaneous [[id number]]      Medication Administration  Injection # 1:    Medication: Allergy Injection (1)    Diagnosis: ALLERGIC RHINITIS, CHRONIC SEASONAL AND PERENNIAL (ICD-477.9)    Route: SQ    Site: R deltoid    Exp Date: 08/18/2009    Lot #: TGW/MOLD    Mfr: [place]    Patient tolerated injection without complications    Given by: Burna Mortimer Trabucco (Jan 18, 2009 12:56 PM)  Orders Added: 1)  Admin of Therapeutic Inj  intramuscular or subcutaneous [[id number]]

## 2010-10-01 NOTE — Assessment & Plan Note (Signed)
Summary: ALLERGIE SHOTS/[facility]  Nurse Visit    Prior Medications: PROTONIX 40 MG TBEC (PANTOPRAZOLE SODIUM) 1 qd LEVOXYL 88 MCG TABS (LEVOTHYROXINE SODIUM) 1 qd VYTORIN 10-20 MG TABS (EZETIMIBE-SIMVASTATIN) 1 q hs ASTELIN 137 MCG/SPRAY  SOLN (AZELASTINE HCL)  OMNARIS 50 MCG/ACT  SUSP (CICLESONIDE)  SINGULAIR 10 MG  TABS (MONTELUKAST SODIUM)  CLARINEX 5 MG  TABS (DESLORATADINE)  Current Allergies: ! PENICILLIN ! SULFA ! KEFLEX    Medication Administration  Injection # 1:    Medication: Allergy Injection (1)    Diagnosis: ALLERGIC RHINITIS, CHRONIC SEASONAL AND PERENNIAL (ICD-477.9)    Route: SQ    Site: R deltoid    Exp Date: 09/09/2008    Lot #: MOLD/ T-G-W    Mfr: [place]    Comments: 0.3ML SUBQU IN EACH ARM    Patient tolerated injection without complications    Given by: Burna Mortimer Petillo (December 30, 2007 4:36 PM)  Orders Added: 1)  Allergy Injection (1) [[id number]] 2)  Admin of Therapeutic Inj  intramuscular or subcutaneous Lepidus.Putnam    ]  Medication Administration  Injection # 1:    Medication: Allergy Injection (1)    Diagnosis: ALLERGIC RHINITIS, CHRONIC SEASONAL AND PERENNIAL (ICD-477.9)    Route: SQ    Site: R deltoid    Exp Date: 09/09/2008    Lot #: MOLD/ T-G-W    Mfr: [place]    Comments: 0.3ML SUBQU IN EACH ARM    Patient tolerated injection without complications    Given by: Burna Mortimer Symes (December 30, 2007 4:36 PM)  Orders Added: 1)  Allergy Injection (1) [95115] 2)  Admin of Therapeutic Inj  intramuscular or subcutaneous [[id number]]

## 2010-10-01 NOTE — Assessment & Plan Note (Signed)
Summary: ALLERGY SHOTS/[facility]   Nurse Visit    Prior Medications: PROTONIX 40 MG TBEC (PANTOPRAZOLE SODIUM) 1 DAILY BY MOUTH LEVOXYL 88 MCG TABS (LEVOTHYROXINE SODIUM) 1 TABLET DAILY BY MOUTH VYTORIN 10-20 MG TABS (EZETIMIBE-SIMVASTATIN) 1 TABLET AT BEDTIME BY MOUTH ASTELIN 137 MCG/SPRAY  SOLN (AZELASTINE HCL)  OMNARIS 50 MCG/ACT  SUSP (CICLESONIDE)  SINGULAIR 10 MG  TABS (MONTELUKAST SODIUM) 1 DAILY BY MOUTH CLARINEX 5 MG  TABS (DESLORATADINE) 1 DAILY BY MOUTH LYRICA 50 MG  CAPS (PREGABALIN) one cap by mouth three times a day Current Allergies: ! PENICILLIN ! SULFA ! KEFLEX    Medication Administration  Injection # 1:    Medication: Allergy Injection (1)    Diagnosis: ALLERGIC RHINITIS, CHRONIC SEASONAL AND PERENNIAL (ICD-477.9)    Route: SQ    Site: R deltoid    Exp Date: 01/18/2009    Lot #: mold/tgw    Mfr: [place] allergy    Comments: 0.3 ml of each vial subqu    Patient tolerated injection without complications    Given by: Burna Mortimer Creswell (February 22, 2008 3:59 PM)  Orders Added: 1)  Admin of Therapeutic Inj  intramuscular or subcutaneous Lepidus.Putnam    ]  Medication Administration  Injection # 1:    Medication: Allergy Injection (1)    Diagnosis: ALLERGIC RHINITIS, CHRONIC SEASONAL AND PERENNIAL (ICD-477.9)    Route: SQ    Site: R deltoid    Exp Date: 01/18/2009    Lot #: mold/tgw    Mfr: [place] allergy    Comments: 0.3 ml of each vial subqu    Patient tolerated injection without complications    Given by: Burna Mortimer Sun (February 22, 2008 3:59 PM)  Orders Added: 1)  Admin of Therapeutic Inj  intramuscular or subcutaneous [[id number]]

## 2010-10-01 NOTE — Assessment & Plan Note (Signed)
Summary: allergy shot/[facility]   Nurse Visit   Allergies: 1)  ! Penicillin 2)  ! Sulfa 3)  ! Keflex  Medication Administration  Injection # 1:    Medication: Allergy Injection (1)    Diagnosis: ALLERGIC RHINITIS, CHRONIC SEASONAL AND PERENNIAL (ICD-477.9)    Route: SQ    Site: L deltoid    Exp Date: 08/08/2009    Lot #: t-g-w// mold    Mfr: [place]    Patient tolerated injection without complications    Given by: Jerryl Holzhauer LPN (March 22, 2009 2:15 PM)  Orders Added: 1)  Admin of Therapeutic Inj  intramuscular or subcutaneous [[id number]]   Medication Administration  Injection # 1:    Medication: Allergy Injection (1)    Diagnosis: ALLERGIC RHINITIS, CHRONIC SEASONAL AND PERENNIAL (ICD-477.9)    Route: SQ    Site: L deltoid    Exp Date: 08/08/2009    Lot #: t-g-w// mold    Mfr: [place]    Patient tolerated injection without complications    Given by: Daryel Kenneth LPN (March 22, 2009 2:15 PM)  Orders Added: 1)  Admin of Therapeutic Inj  intramuscular or subcutaneous [[id number]]

## 2010-10-01 NOTE — Progress Notes (Signed)
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 Initial call taken by: Lowella Petties CMA,  February 05, 2010 11:49 AM

## 2010-10-01 NOTE — Consult Note (Signed)
Summary: Dr. Mcarthur Rossetti Office Note  Dr. Mcarthur Rossetti Office Note   Imported By: Beau Fanny 06/15/2007 11:11:29  _____________________________________________________________________  External Attachment:    Type:   Image     Comment:   External Document

## 2010-10-01 NOTE — Assessment & Plan Note (Signed)
Summary: ALLERGY SHOTS/[facility]   Nurse Visit    Prior Medications: PROTONIX 40 MG TBEC (PANTOPRAZOLE SODIUM) 1 DAILY BY MOUTH LEVOXYL 88 MCG TABS (LEVOTHYROXINE SODIUM) 1 TABLET DAILY BY MOUTH VYTORIN 10-20 MG TABS (EZETIMIBE-SIMVASTATIN) 1 TABLET AT BEDTIME BY MOUTH OMNARIS 50 MCG/ACT  SUSP (CICLESONIDE)  CLARINEX 5 MG  TABS (DESLORATADINE) 1 DAILY BY MOUTH HYCODAN () 1 tsp at bedtime and repeat in 4-6hrs if needed for cough Current Allergies: ! PENICILLIN ! SULFA ! KEFLEX    Medication Administration  Injection # 1:    Medication: Allergy Injection (1)    Diagnosis: ALLERGIC RHINITIS, CHRONIC SEASONAL AND PERENNIAL (ICD-477.9)    Route: SQ    Site: R deltoid    Exp Date: 08/08/2009    Lot #: mold/tgw    Mfr: [place] allergy    Patient tolerated injection without complications    Given by: Burna Mortimer Johnsey (October 12, 2008 9:19 AM)  Orders Added: 1)  Admin of Therapeutic Inj  intramuscular or subcutaneous Lepidus.Putnam    ]  Medication Administration  Injection # 1:    Medication: Allergy Injection (1)    Diagnosis: ALLERGIC RHINITIS, CHRONIC SEASONAL AND PERENNIAL (ICD-477.9)    Route: SQ    Site: R deltoid    Exp Date: 08/08/2009    Lot #: mold/tgw    Mfr: [place] allergy    Patient tolerated injection without complications    Given by: Burna Mortimer Runkle (October 12, 2008 9:19 AM)  Orders Added: 1)  Admin of Therapeutic Inj  intramuscular or subcutaneous [[id number]]

## 2010-10-01 NOTE — Assessment & Plan Note (Signed)
Summary: allergy shot/[facility]   Nurse Visit     Allergies: 1)  ! Penicillin 2)  ! Sulfa 3)  ! Keflex     Medication Administration  Injection # 1:    Medication: Allergy Injection (1)    Diagnosis: ALLERGIC RHINITIS, CHRONIC SEASONAL AND PERENNIAL (ICD-477.9)    Route: SQ    Site: L deltoid    Exp Date: 08/08/2009    Lot #: MOLD/WOOD    Mfr: [place]    Patient tolerated injection without complications    Given by: Burna Mortimer Hanke (November 30, 2008 9:45 AM)  Orders Added: 1)  Admin of Therapeutic Inj  intramuscular or subcutaneous Lepidus.Putnam    ]  Medication Administration  Injection # 1:    Medication: Allergy Injection (1)    Diagnosis: ALLERGIC RHINITIS, CHRONIC SEASONAL AND PERENNIAL (ICD-477.9)    Route: SQ    Site: L deltoid    Exp Date: 08/08/2009    Lot #: MOLD/WOOD    Mfr: [place]    Patient tolerated injection without complications    Given by: Burna Mortimer Oesterling (November 30, 2008 9:45 AM)  Orders Added: 1)  Admin of Therapeutic Inj  intramuscular or subcutaneous [[id number]]

## 2010-10-01 NOTE — Assessment & Plan Note (Signed)
Summary: ALLERGY INJECTIONS/[facility]   Nurse Visit    Prior Medications: PROTONIX 40 MG TBEC (PANTOPRAZOLE SODIUM) 1 DAILY BY MOUTH LEVOXYL 88 MCG TABS (LEVOTHYROXINE SODIUM) 1 TABLET DAILY BY MOUTH VYTORIN 10-20 MG TABS (EZETIMIBE-SIMVASTATIN) 1 TABLET AT BEDTIME BY MOUTH ASTELIN 137 MCG/SPRAY  SOLN (AZELASTINE HCL)  OMNARIS 50 MCG/ACT  SUSP (CICLESONIDE)  SINGULAIR 10 MG  TABS (MONTELUKAST SODIUM) 1 DAILY BY MOUTH CLARINEX 5 MG  TABS (DESLORATADINE) 1 DAILY BY MOUTH LYRICA 50 MG  CAPS (PREGABALIN) one cap by mouth three times a day Current Allergies: ! PENICILLIN ! SULFA ! KEFLEX    Medication Administration  Injection # 1:    Medication: Allergy Injection (1)    Diagnosis: ALLERGIC RHINITIS, CHRONIC SEASONAL AND PERENNIAL (ICD-477.9)    Route: SQ    Site: R deltoid    Exp Date: 09/09/2008    Lot #: mold/tgw    Mfr: [place]    Comments: mold in right arm 0.14ml // 0.60mls of t/g/w/ in left arm    Patient tolerated injection without complications    Given by: Burna Mortimer Mccubbins (Jan 12, 2008 4:07 PM)  Orders Added: 1)  Allergy Injection (1) [95115] 2)  Admin of Therapeutic Inj  intramuscular or subcutaneous Lepidus.Putnam    ]  Medication Administration  Injection # 1:    Medication: Allergy Injection (1)    Diagnosis: ALLERGIC RHINITIS, CHRONIC SEASONAL AND PERENNIAL (ICD-477.9)    Route: SQ    Site: R deltoid    Exp Date: 09/09/2008    Lot #: mold/tgw    Mfr: [place]    Comments: mold in right arm 0.77ml // 0.72mls of t/g/w/ in left arm    Patient tolerated injection without complications    Given by: Burna Mortimer Legler (Jan 12, 2008 4:07 PM)  Orders Added: 1)  Allergy Injection (1) [95115] 2)  Admin of Therapeutic Inj  intramuscular or subcutaneous [[id number]]

## 2010-10-01 NOTE — Assessment & Plan Note (Signed)
Summary: allergy shot/[facility]   Nurse Visit    Prior Medications: PROTONIX 40 MG TBEC (PANTOPRAZOLE SODIUM) 1 DAILY BY MOUTH LEVOXYL 88 MCG TABS (LEVOTHYROXINE SODIUM) 1 TABLET DAILY BY MOUTH VYTORIN 10-20 MG TABS (EZETIMIBE-SIMVASTATIN) 1 TABLET AT BEDTIME BY MOUTH ASTELIN 137 MCG/SPRAY  SOLN (AZELASTINE HCL)  OMNARIS 50 MCG/ACT  SUSP (CICLESONIDE)  SINGULAIR 10 MG  TABS (MONTELUKAST SODIUM) 1 DAILY BY MOUTH CLARINEX 5 MG  TABS (DESLORATADINE) 1 DAILY BY MOUTH LYRICA 75 MG  CAPS (PREGABALIN) one tab by mouth tid Current Allergies: ! PENICILLIN ! SULFA ! KEFLEX    Medication Administration  Injection # 1:    Medication: Allergy Injection (1)    Diagnosis: ALLERGIC RHINITIS, CHRONIC SEASONAL AND PERENNIAL (ICD-477.9)    Route: SQ    Site: L deltoid    Exp Date: 08/08/2009    Lot #: t/g/w//mold    Mfr: [place] allergy    Patient tolerated injection without complications    Given by: Burna Mortimer Huether (September 21, 2008 7:51 AM)  Orders Added: 1)  Admin of Therapeutic Inj  intramuscular or subcutaneous Lepidus.Putnam    ]  Medication Administration  Injection # 1:    Medication: Allergy Injection (1)    Diagnosis: ALLERGIC RHINITIS, CHRONIC SEASONAL AND PERENNIAL (ICD-477.9)    Route: SQ    Site: L deltoid    Exp Date: 08/08/2009    Lot #: t/g/w//mold    Mfr: [place] allergy    Patient tolerated injection without complications    Given by: Burna Mortimer Ohlson (September 21, 2008 7:51 AM)  Orders Added: 1)  Admin of Therapeutic Inj  intramuscular or subcutaneous [[id number]]

## 2010-10-01 NOTE — Assessment & Plan Note (Signed)
Summary: allergy shots/[facility]   Nurse Visit     Allergies: 1)  ! Penicillin 2)  ! Sulfa 3)  ! Keflex     Medication Administration  Injection # 1:    Medication: Allergy Injection (1)    Diagnosis: ALLERGIC RHINITIS, CHRONIC SEASONAL AND PERENNIAL (ICD-477.9)    Route: SQ    Site: L deltoid    Exp Date: 08/08/2009    Lot #: tgw/mold    Mfr: [place]    Patient tolerated injection without complications    Given by: Burna Mortimer Bogert (Jan 24, 2009 11:50 AM)  Orders Added: 1)  Admin of Therapeutic Inj  intramuscular or subcutaneous [[id number]]      Medication Administration  Injection # 1:    Medication: Allergy Injection (1)    Diagnosis: ALLERGIC RHINITIS, CHRONIC SEASONAL AND PERENNIAL (ICD-477.9)    Route: SQ    Site: L deltoid    Exp Date: 08/08/2009    Lot #: tgw/mold    Mfr:     Patient tolerated injection without complications    Given by: Burna Mortimer Lopez (Jan 24, 2009 11:50 AM)  Orders Added: 1)  Admin of Therapeutic Inj  intramuscular or subcutaneous [[id number]]

## 2010-10-01 NOTE — Assessment & Plan Note (Signed)
Summary: allergy injections/[facility]   Nurse Visit    Prior Medications: PROTONIX 40 MG TBEC (PANTOPRAZOLE SODIUM) 1 DAILY BY MOUTH LEVOXYL 88 MCG TABS (LEVOTHYROXINE SODIUM) 1 TABLET DAILY BY MOUTH VYTORIN 10-20 MG TABS (EZETIMIBE-SIMVASTATIN) 1 TABLET AT BEDTIME BY MOUTH ASTELIN 137 MCG/SPRAY  SOLN (AZELASTINE HCL)  OMNARIS 50 MCG/ACT  SUSP (CICLESONIDE)  SINGULAIR 10 MG  TABS (MONTELUKAST SODIUM) 1 DAILY BY MOUTH CLARINEX 5 MG  TABS (DESLORATADINE) 1 DAILY BY MOUTH LYRICA 50 MG  CAPS (PREGABALIN) one cap by mouth three times a day Current Allergies: ! PENICILLIN ! SULFA ! KEFLEX    Medication Administration  Injection # 1:    Medication: Allergy Injection (1)    Diagnosis: ALLERGIC RHINITIS, CHRONIC SEASONAL AND PERENNIAL (ICD-477.9)    Route: SQ    Site: L deltoid    Exp Date: 09/09/2008    Lot #: mold/t-g-w-    Mfr: [place] allergy    Comments: 0.81mold right arm// 0.3 t-g-w left armno redness are whelps    Patient tolerated injection without complications    Given by: Burna Mortimer Wassink (February 03, 2008 8:32 AM)  Orders Added: 1)  Admin of Therapeutic Inj  intramuscular or subcutaneous Lepidus.Putnam    ]  Medication Administration  Injection # 1:    Medication: Allergy Injection (1)    Diagnosis: ALLERGIC RHINITIS, CHRONIC SEASONAL AND PERENNIAL (ICD-477.9)    Route: SQ    Site: L deltoid    Exp Date: 09/09/2008    Lot #: mold/t-g-w-    Mfr: [place] allergy    Comments: 0.33mold right arm// 0.3 t-g-w left armno redness are whelps    Patient tolerated injection without complications    Given by: Burna Mortimer Rigsbee (February 03, 2008 8:32 AM)  Orders Added: 1)  Admin of Therapeutic Inj  intramuscular or subcutaneous [[id number]]

## 2010-10-01 NOTE — Assessment & Plan Note (Signed)
Summary: uri symptoms/[facility]   Vital Signs:  Patient Profile:   55 Years Old Male Height:     69 inches (175.26 cm) Weight:      187 pounds Temp:     99.6 degrees F oral Pulse rate:   76 / minute Pulse rhythm:   regular BP sitting:   110 / 70  (left arm) Cuff size:   regular  Vitals Entered By: Providence Crosby (October 06, 2008 11:41 AM)                 Chief Complaint:  URI SYMPTOMS.  History of Present Illness: Here for URI--onset x 1wk with nasal congestion, developed with more congestion during the night--ST, low  grade temp today. some cough, N?V/D --taking nothing --no work today    Prior Medications Reviewed Using: Patient Recall  Current Allergies (reviewed today): ! PENICILLIN ! SULFA ! KEFLEX     Review of Systems      See HPI   Physical Exam  General:     alert, well-developed, well-nourished, and well-hydrated.  NAD Ears:     R ear normal and L ear normal.   Nose:     no airflow obstruction, mucosal erythema, and mucosal edema.  frontal sinuses tender only Mouth:     no exudates and pharyngeal erythema.   Lungs:     moist cough, no crackles and no wheezes.   Skin:     turgor normal, color normal, and no rashes.   Cervical Nodes:     no anterior cervical adenopathy and no posterior cervical adenopathy.   Psych:     normally interactive and good eye contact.      Impression & Recommendations:  Problem # 1:  URI (ICD-465.9) Assessment: New continue comfort care measures: increase po fluids, rest, tylenol or IBP as needed will start clarinex once daily to every other day as needed congestion will use omnaris daily for 4-5d to reduce nasal congestion Rx for hycodone given --will fill if needs for cough  see back if worsens  His updated medication list for this problem includes:    Clarinex 5 Mg Tabs (Desloratadine) .[name]... 1 daily by mouth   Complete Medication List: 1)  Protonix 40 Mg Tbec (Pantoprazole sodium) .[name].. 1 daily by mouth 2)   Levoxyl 88 Mcg Tabs (Levothyroxine sodium) .[name].. 1 tablet daily by mouth 3)  Vytorin 10-20 Mg Tabs (Ezetimibe-simvastatin) .[name].. 1 tablet at bedtime by mouth 4)  Omnaris 50 Mcg/act Susp (Ciclesonide) 5)  Clarinex 5 Mg Tabs (Desloratadine) .[name].. 1 daily by mouth 6)  Hycodan  .[name].. 1 tsp at bedtime and repeat in 4-6hrs if needed for cough    Prescriptions: HYCODAN 1 tsp at bedtime and repeat in 4-6hrs if needed for cough  #166ml x 0   Entered and Authorized by:   Gildardo Griffes FNP   Signed by:   Gildardo Griffes FNP on 10/06/2008   Method used:   Print then Give to Patient   RxID:   [id number]

## 2010-10-01 NOTE — Assessment & Plan Note (Signed)
Summary: allergy injections/[facility]   Nurse Visit   Allergies: 1)  ! Penicillin 2)  ! Sulfa 3)  ! Keflex  Medication Administration  Injection # 1:    Medication: Allergy Injection (1)    Diagnosis: ALLERGIC RHINITIS, CHRONIC SEASONAL AND PERENNIAL (ICD-477.9)    Route: SQ    Site: R deltoid    Exp Date: 08/08/2009    Lot #: tgw/mold    Mfr: [place]    Comments: slight reddness to right arm //mold    Patient tolerated injection without complications    Given by: Orvill Coulthard LPN (March 14, 2009 3:38 PM)  Orders Added: 1)  Admin of Therapeutic Inj  intramuscular or subcutaneous [[id number]]   Medication Administration  Injection # 1:    Medication: Allergy Injection (1)    Diagnosis: ALLERGIC RHINITIS, CHRONIC SEASONAL AND PERENNIAL (ICD-477.9)    Route: SQ    Site: R deltoid    Exp Date: 08/08/2009    Lot #: tgw/mold    Mfr: [place]    Comments: slight reddness to right arm //mold    Patient tolerated injection without complications    Given by: Braydin Aloi LPN (March 14, 2009 3:38 PM)  Orders Added: 1)  Admin of Therapeutic Inj  intramuscular or subcutaneous [[id number]]

## 2010-10-01 NOTE — Assessment & Plan Note (Signed)
Summary: allergy injection/[facility]   Nurse Visit    Prior Medications: PROTONIX 40 MG TBEC (PANTOPRAZOLE SODIUM) 1 DAILY BY MOUTH LEVOXYL 88 MCG TABS (LEVOTHYROXINE SODIUM) 1 TABLET DAILY BY MOUTH VYTORIN 10-20 MG TABS (EZETIMIBE-SIMVASTATIN) 1 TABLET AT BEDTIME BY MOUTH ASTELIN 137 MCG/SPRAY  SOLN (AZELASTINE HCL)  OMNARIS 50 MCG/ACT  SUSP (CICLESONIDE)  SINGULAIR 10 MG  TABS (MONTELUKAST SODIUM) 1 DAILY BY MOUTH CLARINEX 5 MG  TABS (DESLORATADINE) 1 DAILY BY MOUTH LYRICA 75 MG  CAPS (PREGABALIN) one tab by mouth tid Current Allergies: ! PENICILLIN ! SULFA ! KEFLEX    Medication Administration  Injection # 1:    Medication: Allergy Injection (1)    Diagnosis: ALLERGIC RHINITIS, CHRONIC SEASONAL AND PERENNIAL (ICD-477.9)    Route: SQ    Site: L deltoid    Exp Date: 08/08/2009    Lot #: t-g-w//mold    Mfr: [place] allergy    Patient tolerated injection without complications    Given by: Burna Mortimer Wemhoff (September 13, 2008 9:24 AM)  Orders Added: 1)  Admin of Therapeutic Inj  intramuscular or subcutaneous Lepidus.Putnam    ]  Medication Administration  Injection # 1:    Medication: Allergy Injection (1)    Diagnosis: ALLERGIC RHINITIS, CHRONIC SEASONAL AND PERENNIAL (ICD-477.9)    Route: SQ    Site: L deltoid    Exp Date: 08/08/2009    Lot #: t-g-w//mold    Mfr: [place] allergy    Patient tolerated injection without complications    Given by: Burna Mortimer Mallek (September 13, 2008 9:24 AM)  Orders Added: 1)  Admin of Therapeutic Inj  intramuscular or subcutaneous [[id number]]

## 2010-10-01 NOTE — Assessment & Plan Note (Signed)
Summary: allergy shot/[facility]   Nurse Visit    Prior Medications: PROTONIX 40 MG TBEC (PANTOPRAZOLE SODIUM) 1 DAILY BY MOUTH LEVOXYL 88 MCG TABS (LEVOTHYROXINE SODIUM) 1 TABLET DAILY BY MOUTH VYTORIN 10-20 MG TABS (EZETIMIBE-SIMVASTATIN) 1 TABLET AT BEDTIME BY MOUTH OMNARIS 50 MCG/ACT  SUSP (CICLESONIDE)  CLARINEX 5 MG  TABS (DESLORATADINE) 1 DAILY BY MOUTH HYCODAN () 1 tsp at bedtime and repeat in 4-6hrs if needed for cough Current Allergies: ! PENICILLIN ! SULFA ! KEFLEX    Medication Administration  Injection # 1:    Medication: Allergy Injection (1)    Diagnosis: ALLERGIC RHINITIS, CHRONIC SEASONAL AND PERENNIAL (ICD-477.9)    Route: SQ    Site: R deltoid    Exp Date: 06/28/2009    Lot #: mold and tgw    Mfr: [place] allergy    Patient tolerated injection without complications    Given by: Burna Mortimer Malachowski (October 26, 2008 9:44 AM)  Orders Added: 1)  Admin of Therapeutic Inj  intramuscular or subcutaneous Lepidus.Putnam    ]  Medication Administration  Injection # 1:    Medication: Allergy Injection (1)    Diagnosis: ALLERGIC RHINITIS, CHRONIC SEASONAL AND PERENNIAL (ICD-477.9)    Route: SQ    Site: R deltoid    Exp Date: 06/28/2009    Lot #: mold and tgw    Mfr: [place] allergy    Patient tolerated injection without complications    Given by: Burna Mortimer Degroote (October 26, 2008 9:44 AM)  Orders Added: 1)  Admin of Therapeutic Inj  intramuscular or subcutaneous [[id number]]

## 2010-10-01 NOTE — Assessment & Plan Note (Signed)
Summary: flu shot/[facility]   Nurse Visit    Prior Medications: PROTONIX 40 MG TBEC (PANTOPRAZOLE SODIUM) 1 qd LEVOXYL 88 MCG TABS (LEVOTHYROXINE SODIUM) 1 qd VYTORIN 10-20 MG TABS (EZETIMIBE-SIMVASTATIN) 1 q hs ASTELIN 137 MCG/SPRAY  SOLN (AZELASTINE HCL)  OMNARIS 50 MCG/ACT  SUSP (CICLESONIDE)  SINGULAIR 10 MG  TABS (MONTELUKAST SODIUM)  CLARINEX 5 MG  TABS (DESLORATADINE)  Current Allergies: ! PENICILLIN ! SULFA ! KEFLEX    Orders Added: 1)  Flu Vaccine 45yrs + [[id number]] 2)  Admin 1st Vaccine Mishka.Peer    ]  Influenza Vaccine    Vaccine Type: Fluvax 3+    Site: left deltoid    Mfr: [employer] Pasteur    Dose: 0.5 ml    Route: IM    Given by: Providence Crosby    Exp. Date: 02/29/2008    Lot #: [id number]    VIS given: 02/28/05 version given June 24, 2007.  Flu Vaccine Consent Questions    Do you have a history of severe allergic reactions to this vaccine? no    Any prior history of allergic reactions to egg and/or gelatin? no    Do you have a sensitivity to the preservative Thimersol? no    Do you have a past history of Guillan-Barre Syndrome? no    Do you currently have an acute febrile illness? no    Have you ever had a severe reaction to latex? no    Vaccine information given and explained to patient? yes

## 2010-10-01 NOTE — Assessment & Plan Note (Signed)
Summary: allergy shot/[facility]  Nurse Visit    Prior Medications: PROTONIX 40 MG TBEC (PANTOPRAZOLE SODIUM) 1 qd LEVOXYL 88 MCG TABS (LEVOTHYROXINE SODIUM) 1 qd VYTORIN 10-20 MG TABS (EZETIMIBE-SIMVASTATIN) 1 q hs ASTELIN 137 MCG/SPRAY  SOLN (AZELASTINE HCL)  OMNARIS 50 MCG/ACT  SUSP (CICLESONIDE)  SINGULAIR 10 MG  TABS (MONTELUKAST SODIUM)  CLARINEX 5 MG  TABS (DESLORATADINE)  Current Allergies: ! PENICILLIN ! SULFA ! KEFLEX    Medication Administration  Injection # 1:    Medication: Allergy Injection (1)    Diagnosis: ALLERGIC RHINITIS, CHRONIC SEASONAL AND PERENNIAL (ICD-477.9)    Route: SQ    Site: L deltoid    Exp Date: 09/09/2008    Lot #: mold/t-g-w    Mfr: [place]    Comments: 0.3 ml each arm with no reactions    Patient tolerated injection without complications    Given by: Burna Mortimer Furnish (December 16, 2007 3:51 PM)  Orders Added: 1)  Allergy Injection (1) [95115] 2)  Admin of Therapeutic Inj  intramuscular or subcutaneous Lepidus.Putnam    ]    Medication Administration  Injection # 1:    Medication: Allergy Injection (1)    Diagnosis: ALLERGIC RHINITIS, CHRONIC SEASONAL AND PERENNIAL (ICD-477.9)    Route: SQ    Site: L deltoid    Exp Date: 09/09/2008    Lot #: mold/t-g-w    Mfr: [place]    Comments: 0.3 ml each arm with no reactions    Patient tolerated injection without complications    Given by: Burna Mortimer Pyeatt (December 16, 2007 3:51 PM)  Orders Added: 1)  Allergy Injection (1) [95115] 2)  Admin of Therapeutic Inj  intramuscular or subcutaneous [[id number]]

## 2010-10-01 NOTE — Consult Note (Signed)
Summary: [facility]/Consultation Report/Dr. Jerral [facility]/Consultation Report/Dr. [place] Callas   Imported By: Mickle Asper 06/11/2007 15:50:06  _____________________________________________________________________  External Attachment:    Type:   Image     Comment:   External Document  Appended Document: [facility]/Consultation Report/Dr. Samule Dry Medications Added ASTELIN 137 MCG/SPRAY  SOLN (AZELASTINE HCL)  OMNARIS 50 MCG/ACT  SUSP (CICLESONIDE)  SINGULAIR 10 MG  TABS (MONTELUKAST SODIUM)  CLARINEX 5 MG  TABS (DESLORATADINE)             Current Allergies: ! PENICILLIN ! SULFA ! KEFLEX        Complete Medication List: 1)  Protonix 40 Mg Tbec (Pantoprazole sodium) .[name].. 1 qd 2)  Levoxyl 88 Mcg Tabs (Levothyroxine sodium) .[name].. 1 qd 3)  Vytorin 10-20 Mg Tabs (Ezetimibe-simvastatin) .[name].. 1 q hs 4)  Astelin 137 Mcg/spray Soln (Azelastine hcl) 5)  Omnaris 50 Mcg/act Susp (Ciclesonide) 6)  Singulair 10 Mg Tabs (Montelukast sodium) 7)  Clarinex 5 Mg Tabs (Desloratadine)     ]

## 2010-10-01 NOTE — Assessment & Plan Note (Signed)
Summary: allergy shot/[facility]   Nurse Visit    Prior Medications: PROTONIX 40 MG TBEC (PANTOPRAZOLE SODIUM) 1 DAILY BY MOUTH LEVOXYL 88 MCG TABS (LEVOTHYROXINE SODIUM) 1 TABLET DAILY BY MOUTH VYTORIN 10-20 MG TABS (EZETIMIBE-SIMVASTATIN) 1 TABLET AT BEDTIME BY MOUTH ASTELIN 137 MCG/SPRAY  SOLN (AZELASTINE HCL)  OMNARIS 50 MCG/ACT  SUSP (CICLESONIDE)  SINGULAIR 10 MG  TABS (MONTELUKAST SODIUM) 1 DAILY BY MOUTH CLARINEX 5 MG  TABS (DESLORATADINE) 1 DAILY BY MOUTH LYRICA 75 MG  CAPS (PREGABALIN) one tab by mouth tid Current Allergies: ! PENICILLIN ! SULFA ! KEFLEX    Medication Administration  Injection # 1:    Medication: Allergy Injection (1)    Diagnosis: ALLERGIC RHINITIS, CHRONIC SEASONAL AND PERENNIAL (ICD-477.9)    Route: SQ    Site: L deltoid    Exp Date: 02/27/2009    Lot #: t-g-w//mold    Mfr: [place] allergy    Comments: 0.42ml of mold and 0.3 of t-g-w given without andy sign of reaction    Patient tolerated injection without complications    Given by: Burna Mortimer Hegstrom (June 22, 2008 10:36 AM)  Orders Added: 1)  Admin of patients own med IM/SQ [place]    ]  Medication Administration  Injection # 1:    Medication: Allergy Injection (1)    Diagnosis: ALLERGIC RHINITIS, CHRONIC SEASONAL AND PERENNIAL (ICD-477.9)    Route: SQ    Site: L deltoid    Exp Date: 02/27/2009    Lot #: t-g-w//mold    Mfr: [place] allergy    Comments: 0.4ml of mold and 0.3 of t-g-w given without andy sign of reaction    Patient tolerated injection without complications    Given by: Burna Mortimer Kulak (June 22, 2008 10:36 AM)  Orders Added: 1)  Admin of patients own med IM/SQ [[id number]]

## 2010-10-01 NOTE — Procedures (Signed)
Summary: Colonoscopy by Dr. Mariana Kaufman  Colonoscopy by Dr. Mariana Kaufman   Imported By: Beau Fanny 08/10/2009 13:59:12  _____________________________________________________________________  External Attachment:    Type:   Image     Comment:   External Document  Appended Document: Colonoscopy by Dr. Mariana Kaufman     Clinical Lists Changes  Observations: Added new observation of COLONNXTDUE: 08/2014 (08/10/2009 14:54) Added new observation of COLONOSCOPY: Adenomatous Polyp (08/10/2009 14:54) Added new observation of CHIEF CMPLNT: Preventive Care (08/10/2009 14:54) Added new observation of PAST SURG HX: gallbladder ULTRASOUND NORMAL:(04/1987) CYSTOSCOPY ; NORMAL :(08/1989) FLEX SIG; NORMAL:(10/1991) EGD; DISTAL ESOPH. STRICTURE ; SLIDING H.H.:(10/1999) LUMBAR MICRODISECTOMY :(DR. KRITZER):(08/2000) CATARACT LEFT :(03/2003) LASIK RIGHT EYE:(04/12/2003) Colonoscopy Polyp B9 (Dr Jason Fila) 08/10/2009     5 years (08/10/2009 14:54)       Past Surgical History:    gallbladder ULTRASOUND NORMAL:(04/1987)    CYSTOSCOPY ; NORMAL :(08/1989)    FLEX SIG; NORMAL:(10/1991)    EGD; DISTAL ESOPH. STRICTURE ; SLIDING H.H.:(10/1999)    LUMBAR MICRODISECTOMY :(DR. KRITZER):(08/2000)    CATARACT LEFT :(03/2003) LASIK RIGHT EYE:(04/12/2003)    Colonoscopy Polyp B9 (Dr Jason Fila) 08/10/2009     5 years    Colorectal Screening:  Colonoscopy Results:    Date of Exam: 08/10/2009    Results: Adenomatous Polyp  Next Colonoscopy Due:    08/10/2014  PSA Screening:    PSA: 1.03  (03/26/2009)  Immunization & Chemoprophylaxis:    Tetanus vaccine: Td  (07/24/2006)    Influenza vaccine: Fluvax 3+  (05/31/2008)

## 2010-10-01 NOTE — Letter (Signed)
Summary: [facility]  [facility]   Imported By: Lanelle Bal 03/14/2010 11:51:33  _____________________________________________________________________  External Attachment:    Type:   Image     Comment:   External Document  Appended Document: [facility]     Clinical Lists Changes  Observations: Added new observation of PAST MED HX: GERD:(11/29/1998) Hyperlipidemia:(08/1997) Hypothyroidism:(1983) Benign prostatic hypertrophy:(08/1989) Spermatocele per Dr. Annabell Howells with [facility] (03/14/2010 14:50)        Past History:  Past Medical History: GERD:(11/29/1998) Hyperlipidemia:(08/1997) Hypothyroidism:(1983) Benign prostatic hypertrophy:(08/1989) Spermatocele per Dr. Annabell Howells with [facility]

## 2010-10-01 NOTE — Assessment & Plan Note (Signed)
Summary: allergy shot/whc   Nurse Visit    Prior Medications: PROTONIX 40 MG TBEC (PANTOPRAZOLE SODIUM) 1 DAILY BY MOUTH LEVOXYL 88 MCG TABS (LEVOTHYROXINE SODIUM) 1 TABLET DAILY BY MOUTH VYTORIN 10-20 MG TABS (EZETIMIBE-SIMVASTATIN) 1 TABLET AT BEDTIME BY MOUTH ASTELIN 137 MCG/SPRAY  SOLN (AZELASTINE HCL)  OMNARIS 50 MCG/ACT  SUSP (CICLESONIDE)  SINGULAIR 10 MG  TABS (MONTELUKAST SODIUM) 1 DAILY BY MOUTH CLARINEX 5 MG  TABS (DESLORATADINE) 1 DAILY BY MOUTH LYRICA 75 MG  CAPS (PREGABALIN) one tab by mouth tid Current Allergies: ! PENICILLIN ! SULFA ! KEFLEX    Medication Administration  Injection # 1:    Medication: Allergy Injection (1)    Diagnosis: ALLERGIC RHINITIS, CHRONIC SEASONAL AND PERENNIAL (ICD-477.9)    Route: SQ    Site: R deltoid    Exp Date: 02/27/2009    Lot #: t-g-w//mold    Mfr: [place]    Patient tolerated injection without complications    Given by: Burna Mortimer Joslyn (August 23, 2008 12:16 PM)  Orders Added: 1)  Admin of Therapeutic Inj  intramuscular or subcutaneous Lepidus.Putnam    ]  Medication Administration  Injection # 1:    Medication: Allergy Injection (1)    Diagnosis: ALLERGIC RHINITIS, CHRONIC SEASONAL AND PERENNIAL (ICD-477.9)    Route: SQ    Site: R deltoid    Exp Date: 02/27/2009    Lot #: t-g-w//mold    Mfr: [place]    Patient tolerated injection without complications    Given by: Burna Mortimer Grandmaison (August 23, 2008 12:16 PM)  Orders Added: 1)  Admin of Therapeutic Inj  intramuscular or subcutaneous [[id number]]

## 2010-10-01 NOTE — Assessment & Plan Note (Signed)
Summary: allergy shot/whc   Nurse Visit    Prior Medications: PROTONIX 40 MG TBEC (PANTOPRAZOLE SODIUM) 1 DAILY BY MOUTH LEVOXYL 88 MCG TABS (LEVOTHYROXINE SODIUM) 1 TABLET DAILY BY MOUTH VYTORIN 10-20 MG TABS (EZETIMIBE-SIMVASTATIN) 1 TABLET AT BEDTIME BY MOUTH ASTELIN 137 MCG/SPRAY  SOLN (AZELASTINE HCL)  OMNARIS 50 MCG/ACT  SUSP (CICLESONIDE)  SINGULAIR 10 MG  TABS (MONTELUKAST SODIUM) 1 DAILY BY MOUTH CLARINEX 5 MG  TABS (DESLORATADINE) 1 DAILY BY MOUTH LYRICA 75 MG  CAPS (PREGABALIN) one tab by mouth tid Current Allergies: ! PENICILLIN ! SULFA ! KEFLEX    Medication Administration  Injection # 1:    Medication: Allergy Injection (1)    Diagnosis: ALLERGIC RHINITIS, CHRONIC SEASONAL AND PERENNIAL (ICD-477.9)    Route: SQ    Site: L deltoid    Exp Date: 02/27/2009    Lot #: T-G-W//MOLD    Mfr: [place] ALLERGY    Patient tolerated injection without complications    Given by: Burna Mortimer Benecke (July 13, 2008 8:34 AM)  Orders Added: 1)  Admin of Therapeutic Inj  intramuscular or subcutaneous [[id number]] 2)  Admin of patients own med IM/SQ Pansy.[email or web address]    ]  Medication Administration  Injection # 1:    Medication: Allergy Injection (1)    Diagnosis: ALLERGIC RHINITIS, CHRONIC SEASONAL AND PERENNIAL (ICD-477.9)    Route: SQ    Site: L deltoid    Exp Date: 02/27/2009    Lot #: T-G-W//MOLD    Mfr: [place] ALLERGY    Patient tolerated injection without complications    Given by: Burna Mortimer Jeune (July 13, 2008 8:34 AM)  Orders Added: 1)  Admin of Therapeutic Inj  intramuscular or subcutaneous [[id number]] 2)  Admin of patients own med IM/SQ [phone]

## 2010-10-01 NOTE — Miscellaneous (Signed)
Summary: Flu vaccine   Clinical Lists Changes  Observations: Added new observation of FLU VAX: Historical (06/06/2010 11:19)      Influenza Immunization History:    Influenza # 1:  Historical (06/06/2010) Received form from [place], [place], [place]

## 2010-10-01 NOTE — Letter (Signed)
Summary: Nadara Eaton letter  [facility]  [address] [place], [place] [address]   Phone: [phone]  Fax: [phone]       04/04/2010 MRN: [id number]  TERYN GUST [address] [place], [place]  [address]  Dear Mr. KIRWAN,  [facility] [place], and [facility] announce the retirement of Arta Silence, M.D., from full-time practice at the [facility] office effective February 28, 2010 and his plans of returning part-time.  It is important to Dr. Hetty Ely and to our practice that you understand that [facility] has seven physicians in our office for your health care needs.  We will continue to offer the same exceptional care that you have today.    Dr. Hetty Ely has spoken to many of you about his plans for retirement and returning part-time in the fall.   We will continue to work with you through the transition to schedule appointments for you in the office and meet the high standards that [place] is committed to.   Again, it is with great pleasure that we share the news that Dr. Hetty Ely will return to [facility] at [facility] in October of 2011 with a reduced schedule.    If you have any questions, or would like to request an appointment with one of our physicians, please call us at [phone] and press the option for Scheduling an appointment.  We take pleasure in providing you with excellent patient care and look forward to seeing you at your next office visit.  Our [facility] Physicians are:  Tillman Abide, M.D. Laurita Quint, M.D. Roxy Manns, M.D. Kerby Nora, M.D. Hannah Beat, M.D. Ruthe Mannan, M.D. We proudly welcomed Raechel Ache, M.D. and Eustaquio Boyden, M.D. to the practice in July/August 2011.  Sincerely,  [facility]

## 2010-10-01 NOTE — Assessment & Plan Note (Signed)
Summary: NODULE LEFT TESTICLE/[facility]   Vital Signs:  Patient profile:   55 year old male Weight:      180.75 pounds BMI:     26.79 Temp:     98.6 degrees F oral Pulse rate:   84 / minute Pulse rhythm:   regular BP sitting:   140 / 90  (left arm) Cuff size:   regular  Vitals Entered By: Sydell Axon LPN (Jan 14, 2010 11:21 AM) CC: Nodule left testicle   History of Present Illness: Pt here for lump on scrotum found last Wed nite. It was sore which got him looking. He has had no fever, no probs with urination, no probs with ejaculation. Has not had ejaculation since finding it. He has done nothing to it and has never had this before.  Problems Prior to Update: 1)  Special Screening Malig Neoplasms Other Sites  (ICD-V76.49) 2)  Peripheral Neuropathy, Right Lat Foot  (ICD-355.8) 3)  Dry Skin, Face  (ICD-757.39) 4)  Health Maintenance Exam  (ICD-V70.0) 5)  Low Back Pain, Chronic  (ICD-724.2) 6)  Hyperglycemia  (ICD-790.29) 7)  Psoriasis  (ICD-696.1) 8)  Benign Prostatic Hypertrophy  (ICD-600.00) 9)  Hypothyroidism  (ICD-244.9) 10)  Hyperlipidemia  (ICD-272.4) 11)  Gerd  (ICD-530.81) 12)  Allergic Rhinitis, Chronic Seasonal and Perennial  (ICD-477.9)  Medications Prior to Update: 1)  Protonix 40 Mg Tbec (Pantoprazole Sodium) .[name].. 1 Daily By Mouth 2)  Levoxyl 88 Mcg Tabs (Levothyroxine Sodium) .[name].. 1 Tablet Daily By Mouth 3)  Vytorin 10-20 Mg Tabs (Ezetimibe-Simvastatin) .[name].. 1 Tablet At Bedtime By Mouth 4)  Omnaris 50 Mcg/act  Susp (Ciclesonide) .[name].. 1 Spray Each Nostril Prn 5)  Clarinex 5 Mg  Tabs (Desloratadine) .[name].. 1 Daily By Mouth As Needed 6)  Noni Juice 3000 Mg/22ml Liqd (Noni (Morinda Citrifolia)) .... 30 Mls Daily By Mouth  Allergies: 1)  ! Penicillin 2)  ! Sulfa 3)  ! Keflex  Physical Exam  General:  Well-developed,well-nourished,in no acute distress; alert,appropriate and cooperative throughout examination Genitalia:  Testes bilaterally descended without nodularity.  R w/o  tenderness or mass.  Left with tenderness of the eipdidymus, tasticle mobile no other masses felt. No scrotal masses or lesions felt . No penis lesions or urethral discharge.   Impression & Recommendations:  Problem # 1:  EPIDIDYMITIS, LEFT (ICD-604.90) Assessment New Trial of Cipro 500 two times a day for three weeks.  Complete Medication List: 1)  Protonix 40 Mg Tbec (Pantoprazole sodium) .[name].. 1 daily by mouth 2)  Levoxyl 88 Mcg Tabs (Levothyroxine sodium) .[name].. 1 tablet daily by mouth 3)  Vytorin 10-20 Mg Tabs (Ezetimibe-simvastatin) .[name].. 1 tablet at bedtime by mouth 4)  Omnaris 50 Mcg/act Susp (Ciclesonide) .[name].. 1 spray each nostril as needed 5)  Clarinex 5 Mg Tabs (Desloratadine) .[name].. 1 daily by mouth as needed 6)  Noni Juice 3000 Mg/62ml Liqd (Noni (morinda citrifolia)) .... 30 mls daily by mouth 7)  Ciprofloxacin Hcl 500 Mg Tabs (Ciprofloxacin hcl) .... One tab by mouth two times a day  Patient Instructions: 1)  Call if sxs worsen or don't resolve by the end of the Abs. Prescriptions: CIPROFLOXACIN HCL 500 MG TABS (CIPROFLOXACIN HCL) one tab by mouth two times a day  #42 x 0   Entered and Authorized by:   Shaune Leeks MD   Signed by:   Shaune Leeks MD on 01/14/2010   Method used:   Electronically to        [facility] (retail)  [address]       [place], [place]  [address]       Ph: [phone]       Fax: [phone]   RxID:   [id number]   Current Allergies (reviewed today): ! PENICILLIN ! SULFA ! KEFLEX

## 2010-10-01 NOTE — Assessment & Plan Note (Signed)
Summary: allergy shots// whc   Nurse Visit    Prior Medications: PROTONIX 40 MG TBEC (PANTOPRAZOLE SODIUM) 1 DAILY BY MOUTH LEVOXYL 88 MCG TABS (LEVOTHYROXINE SODIUM) 1 TABLET DAILY BY MOUTH VYTORIN 10-20 MG TABS (EZETIMIBE-SIMVASTATIN) 1 TABLET AT BEDTIME BY MOUTH ASTELIN 137 MCG/SPRAY  SOLN (AZELASTINE HCL)  OMNARIS 50 MCG/ACT  SUSP (CICLESONIDE)  SINGULAIR 10 MG  TABS (MONTELUKAST SODIUM) 1 DAILY BY MOUTH CLARINEX 5 MG  TABS (DESLORATADINE) 1 DAILY BY MOUTH LYRICA 75 MG  CAPS (PREGABALIN) one tab by mouth tid Current Allergies: ! PENICILLIN ! SULFA ! KEFLEX    Medication Administration  Injection # 1:    Medication: Allergy Injection (1)    Diagnosis: ALLERGIC RHINITIS, CHRONIC SEASONAL AND PERENNIAL (ICD-477.9)    Route: SQ    Site: R deltoid    Exp Date: 01/30/2009    Lot #: T-G-W/MOLD    Mfr: [place] ALLERGY    Comments: 0.3 MLS SUBQU IN RIGHT DELTOID MOLD 0.3 MLS SUBQU IN LEFT DELTOID T-G-W    Patient tolerated injection without complications    Given by: Burna Mortimer Teters (April 04, 2008 4:54 PM)  Orders Added: 1)  Admin of Therapeutic Inj  intramuscular or subcutaneous Lepidus.Putnam    ]  Medication Administration  Injection # 1:    Medication: Allergy Injection (1)    Diagnosis: ALLERGIC RHINITIS, CHRONIC SEASONAL AND PERENNIAL (ICD-477.9)    Route: SQ    Site: R deltoid    Exp Date: 01/30/2009    Lot #: T-G-W/MOLD    Mfr:  ALLERGY    Comments: 0.3 MLS SUBQU IN RIGHT DELTOID MOLD 0.3 MLS SUBQU IN LEFT DELTOID T-G-W    Patient tolerated injection without complications    Given by: Burna Mortimer Devore (April 04, 2008 4:54 PM)  Orders Added: 1)  Admin of Therapeutic Inj  intramuscular or subcutaneous [[id number]]

## 2010-10-01 NOTE — Assessment & Plan Note (Signed)
Summary: ALLERGY SHOT/[facility]   Nurse Visit     Allergies: 1)  ! Penicillin 2)  ! Sulfa 3)  ! Keflex     Medication Administration  Injection # 1:    Medication: Allergy Injection (1)    Diagnosis: ALLERGIC RHINITIS, CHRONIC SEASONAL AND PERENNIAL (ICD-477.9)    Route: SQ    Site: R deltoid    Exp Date: 08/08/2009    Lot #: t-g-w//mold    Mfr: [place]    Patient tolerated injection without complications    Given by: Burna Mortimer Vantil (December 13, 2008 11:12 AM)  Orders Added: 1)  Admin of Therapeutic Inj  intramuscular or subcutaneous Lepidus.Putnam    ]  Medication Administration  Injection # 1:    Medication: Allergy Injection (1)    Diagnosis: ALLERGIC RHINITIS, CHRONIC SEASONAL AND PERENNIAL (ICD-477.9)    Route: SQ    Site: R deltoid    Exp Date: 08/08/2009    Lot #: t-g-w//mold    Mfr: [place]    Patient tolerated injection without complications    Given by: Burna Mortimer Vert (December 13, 2008 11:12 AM)  Orders Added: 1)  Admin of Therapeutic Inj  intramuscular or subcutaneous [[id number]]

## 2010-10-01 NOTE — Assessment & Plan Note (Signed)
Summary: allergy shot      Current Allergies: ! PENICILLIN ! SULFA ! KEFLEX        Complete Medication List: 1)  Protonix 40 Mg Tbec (Pantoprazole sodium) .[name].. 1 daily by mouth 2)  Levoxyl 88 Mcg Tabs (Levothyroxine sodium) .[name].. 1 tablet daily by mouth 3)  Vytorin 10-20 Mg Tabs (Ezetimibe-simvastatin) .[name].. 1 tablet at bedtime by mouth 4)  Omnaris 50 Mcg/act Susp (Ciclesonide) 5)  Clarinex 5 Mg Tabs (Desloratadine) .[name].. 1 daily by mouth 6)  Hycodan  .[name].. 1 tsp at bedtime and repeat in 4-6hrs if needed for cough      Medication Administration  Injection # 1:    Medication: Allergy Injection (1)    Diagnosis: ALLERGIC RHINITIS, CHRONIC SEASONAL AND PERENNIAL (ICD-477.9)    Route: SQ    Site: R deltoid    Exp Date: 08/08/2009    Lot #: mold/tgw    Mfr: [place] [facility]    Patient tolerated injection without complications    Given by: Burna Mortimer Savell (October 18, 2008 3:52 PM)  Orders Added: 1)  Admin of Therapeutic Inj  intramuscular or subcutaneous [[id number]]

## 2010-10-01 NOTE — Letter (Signed)
Summary: [facility]ERGY / FOLLOW UP / DR. RANJAN SHARMA  [facility] / FOLLOW UP / DR. RANJAN SHARMA   Imported By: Carin Primrose 02/09/2009 15:27:44  _____________________________________________________________________  External Attachment:    Type:   Image     Comment:   External Document

## 2010-10-01 NOTE — Assessment & Plan Note (Signed)
Summary: allergy shot/whc   Nurse Visit     Allergies: 1)  ! Penicillin 2)  ! Sulfa 3)  ! Keflex     Medication Administration  Injection # 1:    Medication: Allergy Injection (1)    Diagnosis: ALLERGIC RHINITIS, CHRONIC SEASONAL AND PERENNIAL (ICD-477.9)    Route: SQ    Site: L deltoid    Exp Date: 08/08/2009    Lot #: tgw//mold    Mfr: [place]    Comments: 0.52ml's of each    Patient tolerated injection without complications    Given by: Burna Mortimer Urenda (Jan 10, 2009 9:05 AM)  Orders Added: 1)  Admin of Therapeutic Inj  intramuscular or subcutaneous [[id number]]      Medication Administration  Injection # 1:    Medication: Allergy Injection (1)    Diagnosis: ALLERGIC RHINITIS, CHRONIC SEASONAL AND PERENNIAL (ICD-477.9)    Route: SQ    Site: L deltoid    Exp Date: 08/08/2009    Lot #: tgw//mold    Mfr: [place]    Comments: 0.25ml's of each    Patient tolerated injection without complications    Given by: Burna Mortimer Lacerda (Jan 10, 2009 9:05 AM)  Orders Added: 1)  Admin of Therapeutic Inj  intramuscular or subcutaneous [[id number]]

## 2010-10-01 NOTE — Assessment & Plan Note (Signed)
Summary: allergy injection/whc   Nurse Visit    Prior Medications: PROTONIX 40 MG TBEC (PANTOPRAZOLE SODIUM) 1 DAILY BY MOUTH LEVOXYL 88 MCG TABS (LEVOTHYROXINE SODIUM) 1 TABLET DAILY BY MOUTH VYTORIN 10-20 MG TABS (EZETIMIBE-SIMVASTATIN) 1 TABLET AT BEDTIME BY MOUTH ASTELIN 137 MCG/SPRAY  SOLN (AZELASTINE HCL)  OMNARIS 50 MCG/ACT  SUSP (CICLESONIDE)  SINGULAIR 10 MG  TABS (MONTELUKAST SODIUM) 1 DAILY BY MOUTH CLARINEX 5 MG  TABS (DESLORATADINE) 1 DAILY BY MOUTH LYRICA 75 MG  CAPS (PREGABALIN) one tab by mouth tid Current Allergies: ! PENICILLIN ! SULFA ! KEFLEX    Medication Administration  Injection # 1:    Medication: Allergy Injection (1)    Diagnosis: ALLERGIC RHINITIS, CHRONIC SEASONAL AND PERENNIAL (ICD-477.9)    Route: SQ    Site: L deltoid    Exp Date: 02/27/2009    Lot #: t-g-w// mold    Mfr: [place] allergy    Comments: 0.1ml subqu mold in right arm// t-g-w 0.44ml in right deltoid    Patient tolerated injection without complications    Given by: Burna Mortimer Berti (May 17, 2008 3:14 PM)  Orders Added: 1)  Admin of Therapeutic Inj  intramuscular or subcutaneous Lepidus.Putnam    ]  Medication Administration  Injection # 1:    Medication: Allergy Injection (1)    Diagnosis: ALLERGIC RHINITIS, CHRONIC SEASONAL AND PERENNIAL (ICD-477.9)    Route: SQ    Site: L deltoid    Exp Date: 02/27/2009    Lot #: t-g-w// mold    Mfr: [place] allergy    Comments: 0.57ml subqu mold in right arm// t-g-w 0.21ml in right deltoid    Patient tolerated injection without complications    Given by: Burna Mortimer Koo (May 17, 2008 3:14 PM)  Orders Added: 1)  Admin of Therapeutic Inj  intramuscular or subcutaneous [[id number]]

## 2010-10-01 NOTE — Assessment & Plan Note (Signed)
Summary: allergy shots/whc   Nurse Visit     Allergies: 1)  ! Penicillin 2)  ! Sulfa 3)  ! Keflex     Medication Administration  Injection # 1:    Medication: Allergy Injection (1)    Diagnosis: ALLERGIC RHINITIS, CHRONIC SEASONAL AND PERENNIAL (ICD-477.9)    Route: SQ    Site: R deltoid    Exp Date: 08/08/2009    Lot #: MOLD//TGW    Mfr: [place]    Patient tolerated injection without complications    Given by: Burna Mortimer Cassata (December 20, 2008 10:45 AM)  Orders Added: 1)  Admin of Therapeutic Inj  intramuscular or subcutaneous [[id number]]      Medication Administration  Injection # 1:    Medication: Allergy Injection (1)    Diagnosis: ALLERGIC RHINITIS, CHRONIC SEASONAL AND PERENNIAL (ICD-477.9)    Route: SQ    Site: R deltoid    Exp Date: 08/08/2009    Lot #: MOLD//TGW    Mfr:     Patient tolerated injection without complications    Given by: Burna Mortimer Mayo (December 20, 2008 10:45 AM)  Orders Added: 1)  Admin of Therapeutic Inj  intramuscular or subcutaneous [[id number]]

## 2010-10-01 NOTE — Assessment & Plan Note (Signed)
Summary: cough/whc  Medications Added PROTONIX 40 MG TBEC (PANTOPRAZOLE SODIUM) 1 qd LEVOXYL 88 MCG TABS (LEVOTHYROXINE SODIUM) 1 qd VYTORIN 10-20 MG TABS (EZETIMIBE-SIMVASTATIN) 1 q hs      Allergies Added: ! PENICILLIN ! SULFA ! KEFLEX  Vital Signs:  Patient Profile:   55 Years Old Male Weight:      188 pounds Temp:     98.1 degrees F oral Pulse rate:   72 / minute BP sitting:   120 / 90  (left arm)  Vitals Entered By: Providence Crosby (Jan 15, 2007 8:35 AM)               Chief Complaint:  Cough.  Acute Visit History: URI HPI   In today for evaluation of symptoms consistent with URI. Onset of symptoms: wed (2days ago)  History of (Y,N) Cough: y Runny nose: n URI symptoms:  Irritability: n Fever: ny Sinus Pressure: y Ears Blocked: n Teeth Ache: y Frontal Headache: y  PMH Sinusitis or Recurrent OM: y PMH Prior Sinus or Ear Surgery: n  Recent antibiotic usage (last 30 days) (Y/N): 1 mo ago PMH or Diabetes or Immunocompromised (Y/N): n Risk factors for Sinusitis: n Smoker/Passive smoke exposure (Y/N): n      Current Allergies (reviewed today): ! PENICILLIN ! SULFA ! KEFLEX      Physical Exam  General:     Well-developed,well-nourished,in no acute distress; alert,appropriate and cooperative throughout examination Head:     Normocephalic and atraumatic without obvious abnormalities. No apparent alopecia or balding. Ears:     External ear exam shows no significant lesions or deformities.  Otoscopic examination reveals clear canals, tympanic membranes are intact bilaterally without bulging, retraction, inflammation or discharge. Hearing is grossly normal bilaterally. Nose:     External nasal examination shows no deformiy.. Nasal mucosa are pink and moist without lesions or exudates.Mild inflammation withthick tan discharge. Mouth:     Oral mucosa and oropharynx without lesions or exudates.  Teeth in good repair. Mild PND Neck:     No deformities,  masses, or tenderness noted. Chest Wall:     No deformities, masses, tenderness or gynecomastia noted. Lungs:     Normal respiratory effort, chest expands symmetrically. Lungs are clear to auscultation, no crackles or wheezes. Heart:     Normal rate and regular rhythm. S1 and S2 normal without gallop, murmur, click, rub or other extra sounds.    Impression & Recommendations:  Problem # 1:  BRONCHITIS NOS (ICD-490) Assessment: New Avelox 400mg  once daily x 1 week Guaifenesin, Pills or liquid, 600 mg by mouth AM and NOON and Tylenol ES (500mg ) 2 tabs by mouth four times a day                                            OR Alleve (220mg ) two tabs by mouth at breakfast and supper PUSH FLUIDS        ALL FOR THE NEXT FOUR OR FIVE DAYS  RTC if not back to normal in one week.  Medications Added to Medication List This Visit: 1)  Protonix 40 Mg Tbec (Pantoprazole sodium) .[name].. 1 qd 2)  Levoxyl 88 Mcg Tabs (Levothyroxine sodium) .[name].. 1 qd 3)  Vytorin 10-20 Mg Tabs (Ezetimibe-simvastatin) .[name].. 1 q hs   Patient Instructions: 1)  Avelox 400mg  once daily x 1 week Given) 2)  Guaifenesin, Pills or liquid,  600 mg by mouth AM and NOON and 3)  Tylenol ES (500mg ) 2 tabs by mouth four times a day 4)                                             OR 5)  Alleve (220mg ) two tabs by mouth at breakfast and supper 6)  PUSH FLUIDS        ALL FOR THE NEXT FOUR OR FIVE DAYS  7)  Please schedule a follow-up appointment as needed.

## 2010-10-01 NOTE — Assessment & Plan Note (Signed)
Summary: allergy shots/whc   Nurse Visit    Prior Medications: PROTONIX 40 MG TBEC (PANTOPRAZOLE SODIUM) 1 DAILY BY MOUTH LEVOXYL 88 MCG TABS (LEVOTHYROXINE SODIUM) 1 TABLET DAILY BY MOUTH VYTORIN 10-20 MG TABS (EZETIMIBE-SIMVASTATIN) 1 TABLET AT BEDTIME BY MOUTH ASTELIN 137 MCG/SPRAY  SOLN (AZELASTINE HCL)  OMNARIS 50 MCG/ACT  SUSP (CICLESONIDE)  SINGULAIR 10 MG  TABS (MONTELUKAST SODIUM) 1 DAILY BY MOUTH CLARINEX 5 MG  TABS (DESLORATADINE) 1 DAILY BY MOUTH LYRICA 75 MG  CAPS (PREGABALIN) one tab by mouth tid Current Allergies: ! PENICILLIN ! SULFA ! KEFLEX    Medication Administration  Injection # 1:    Medication: Allergy Injection (1)    Diagnosis: ALLERGIC RHINITIS, CHRONIC SEASONAL AND PERENNIAL (ICD-477.9)    Route: SQ    Site: R deltoid    Exp Date: 02/27/2009    Lot #: T-G-W-//MOLD    Mfr: [place] ALLERGY    Patient tolerated injection without complications    Given by: Burna Mortimer Duet (July 13, 2008 8:36 AM)  Orders Added: 1)  Admin of patients own med IM/SQ [96372M] 2)  Admin of Therapeutic Inj  intramuscular or subcutaneous Lepidus.Putnam    ]  Medication Administration  Injection # 1:    Medication: Allergy Injection (1)    Diagnosis: ALLERGIC RHINITIS, CHRONIC SEASONAL AND PERENNIAL (ICD-477.9)    Route: SQ    Site: R deltoid    Exp Date: 02/27/2009    Lot #: T-G-W-//MOLD    Mfr: [place] ALLERGY    Patient tolerated injection without complications    Given by: Burna Mortimer Chenier (July 13, 2008 8:36 AM)  Orders Added: 1)  Admin of patients own med IM/SQ [96372M] 2)  Admin of Therapeutic Inj  intramuscular or subcutaneous [[id number]]

## 2010-10-01 NOTE — Miscellaneous (Signed)
Summary: flu shot   Clinical Lists Changes      Received flu shot form from [facility] with information  Appended Document: flu shot     Clinical Lists Changes  Observations: Added new observation of FLU VAX: Fluvax Non-MCR (06/19/2009 15:21)       Immunization History:  Influenza Immunization History:    Influenza:  fluvax non-mcr (06/19/2009)

## 2010-10-01 NOTE — Assessment & Plan Note (Signed)
Summary: allergy injection      Current Allergies: ! PENICILLIN ! SULFA ! KEFLEX        Complete Medication List: 1)  Protonix 40 Mg Tbec (Pantoprazole sodium) .[name].. 1 daily by mouth 2)  Levoxyl 88 Mcg Tabs (Levothyroxine sodium) .[name].. 1 tablet daily by mouth 3)  Vytorin 10-20 Mg Tabs (Ezetimibe-simvastatin) .[name].. 1 tablet at bedtime by mouth 4)  Astelin 137 Mcg/spray Soln (Azelastine hcl) 5)  Omnaris 50 Mcg/act Susp (Ciclesonide) 6)  Singulair 10 Mg Tabs (Montelukast sodium) .[name].. 1 daily by mouth 7)  Clarinex 5 Mg Tabs (Desloratadine) .[name].. 1 daily by mouth 8)  Lyrica 75 Mg Caps (Pregabalin) .... One tab by mouth tid    ]  Medication Administration  Injection # 1:    Medication: Allergy Injection (1)    Diagnosis: ALLERGIC RHINITIS, CHRONIC SEASONAL AND PERENNIAL (ICD-477.9)    Route: SQ    Site: L deltoid    Exp Date: 02/27/2009    Lot #: t-g-w//mold    Mfr:  [place] allergy    Comments: no reddness    Patient tolerated injection without complications    Given by: Burna Mortimer Vanhorne (August 09, 2008 11:02 AM)  Orders Added: 1)  Admin of Therapeutic Inj  intramuscular or subcutaneous [96372] 2)  Admin of patients own med IM/SQ [[id number]]

## 2010-10-01 NOTE — Assessment & Plan Note (Signed)
Summary: allergy shot/whc   Nurse Visit    Prior Medications: PROTONIX 40 MG TBEC (PANTOPRAZOLE SODIUM) 1 DAILY BY MOUTH LEVOXYL 88 MCG TABS (LEVOTHYROXINE SODIUM) 1 TABLET DAILY BY MOUTH VYTORIN 10-20 MG TABS (EZETIMIBE-SIMVASTATIN) 1 TABLET AT BEDTIME BY MOUTH ASTELIN 137 MCG/SPRAY  SOLN (AZELASTINE HCL)  OMNARIS 50 MCG/ACT  SUSP (CICLESONIDE)  SINGULAIR 10 MG  TABS (MONTELUKAST SODIUM) 1 DAILY BY MOUTH CLARINEX 5 MG  TABS (DESLORATADINE) 1 DAILY BY MOUTH LYRICA 75 MG  CAPS (PREGABALIN) one tab by mouth tid Current Allergies: ! PENICILLIN ! SULFA ! KEFLEX    Medication Administration  Injection # 1:    Medication: Allergy Injection (1)    Diagnosis: ALLERGIC RHINITIS, CHRONIC SEASONAL AND PERENNIAL (ICD-477.9)    Route: SQ    Site: R deltoid    Exp Date: 08/08/2009    Lot #: t-g-w//wood    Mfr: [place] allergy    Patient tolerated injection without complications    Given by: Burna Mortimer Koepke (October 04, 2008 11:44 AM)  Orders Added: 1)  Admin of Therapeutic Inj  intramuscular or subcutaneous Lepidus.Putnam    ]  Medication Administration  Injection # 1:    Medication: Allergy Injection (1)    Diagnosis: ALLERGIC RHINITIS, CHRONIC SEASONAL AND PERENNIAL (ICD-477.9)    Route: SQ    Site: R deltoid    Exp Date: 08/08/2009    Lot #: t-g-w//wood    Mfr: [place] allergy    Patient tolerated injection without complications    Given by: Burna Mortimer Magwood (October 04, 2008 11:44 AM)  Orders Added: 1)  Admin of Therapeutic Inj  intramuscular or subcutaneous [[id number]]

## 2010-10-01 NOTE — Assessment & Plan Note (Signed)
Summary: cpx not new patient/whc   Vital Signs:  Patient profile:   55 year old male Height:      69 inches Weight:      182 pounds BMI:     26.97 Temp:     98.2 degrees F oral Pulse rate:   76 / minute Pulse rhythm:   regular BP sitting:   130 / 80  (left arm) Cuff size:   regular  Vitals Entered By: Providence Crosby LPN (March 27, 2009 10:57 AM) CC: check up ? colonoscopy per patient   History of Present Illness: Pt here for Comp Exam, feels well with no complaints. He is 53 and would like a screening colonoscopy. His granfather had colon cancer in his 67s. He has no complaints except aches and pains from work.  Preventive Screening-Counseling & Management  Alcohol-Tobacco     Alcohol drinks/day: 0     Smoking Status: never     Passive Smoke Exposure: no  Caffeine-Diet-Exercise     Caffeine use/day: 1     Does Patient Exercise: no  Problems Prior to Update: 1)  Uri  (ICD-465.9) 2)  Special Screening [place] Neoplasms Other Sites  (ICD-V76.49) 3)  Peripheral Neuropathy, Right Lat Foot  (ICD-355.8) 4)  Dry Skin, Face  (ICD-757.39) 5)  Health Maintenance Exam  (ICD-V70.0) 6)  Low Back Pain, Acute  (ICD-724.2) 7)  Hyperglycemia  (ICD-790.29) 8)  Psoriasis  (ICD-696.1) 9)  Benign Prostatic Hypertrophy  (ICD-600.00) 10)  Hypothyroidism  (ICD-244.9) 11)  Hyperlipidemia  (ICD-272.4) 12)  Gerd  (ICD-530.81) 13)  Allergic Rhinitis, Chronic Seasonal and Perennial  (ICD-477.9) 14)  Bronchitis Nos  (ICD-490)  Medications Prior to Update: 1)  Protonix 40 Mg Tbec (Pantoprazole Sodium) .[name].. 1 Daily By Mouth 2)  Levoxyl 88 Mcg Tabs (Levothyroxine Sodium) .[name].. 1 Tablet Daily By Mouth 3)  Vytorin 10-20 Mg Tabs (Ezetimibe-Simvastatin) .[name].. 1 Tablet At Bedtime By Mouth 4)  Omnaris 50 Mcg/act  Susp (Ciclesonide) 5)  Clarinex 5 Mg  Tabs (Desloratadine) .[name].. 1 Daily By Mouth 6)  Hycodan .[name].. 1 Tsp At Bedtime and Repeat in 4-6hrs If Needed For Cough  Allergies: 1)  ! Penicillin 2)  !  Sulfa 3)  ! Keflex  Past History:  Past Medical History: Last updated: 09/22/2007 GERD:(11/29/1998) Hyperlipidemia:(08/1997) Hypothyroidism:(1983) Benign prostatic hypertrophy:(08/1989)  Past Surgical History: Last updated: 09/22/2007 gallbladder ULTRASOUND NORMAL:(04/1987) CYSTOSCOPY ; NORMAL :(08/1989) FLEX SIG; NORMAL:(10/1991) EGD; DISTAL ESOPH. STRICTURE ; SLIDING H.H.:(10/1999) LUMBAR MICRODISECTOMY :(DR. KRITZER):(08/2000) CATARACT LEFT :(03/2003) LASIK RIGHT EYE:(04/12/2003)  Family History: Last updated: 03/27/2009 Father:ALIVE 83  CABG X 4 12/2001  Mother: ALIVE 22 , HTN ELEVATED  CHOLESTEROL // REPEAT COLONOSCOPY'S (FH) DIABETIC AT AGE 36 Siblings:  1 BROTHER CABG X1; HTN CV: +BROTHER CABG ALIVE; PUNCLE DECEASED MI  M UNCLE --CAD; MUNCLE DECEASED MI HBP: +MOTHER ; +BROTHER DM: +MOTHER AT AGE 36// +M UNCLE GOUT/ARTHRITIS: M UNCLE PROSTATE CANCER: NEGATIVE BREAST/OVARIAN/UTERINE CANCER: NEGATIVE COLON CANCER: MGF DECEASED FROM COLON CANCER DEPRESSION: NEGATIVE ETOH/DRUG ABUSE: NEGATIVE OTHER + STROKE PGF  Social History: Last updated: 09/22/2007 Marital Status: Married// LIVES WITH WIFE Children: 1 DAUGHTER Occupation: Curator // [employer]  Risk Factors: Alcohol Use: 0 (03/27/2009) Caffeine Use: 1 (03/27/2009) Exercise: no (03/27/2009)  Risk Factors: Smoking Status: never (03/27/2009) Passive Smoke Exposure: no (03/27/2009)  Family History: Father:ALIVE 83  CABG X 4 12/2001  Mother: ALIVE 3 , HTN ELEVATED  CHOLESTEROL // REPEAT COLONOSCOPY'S (FH) DIABETIC AT AGE 36 Siblings:  1 BROTHER CABG X1; HTN CV: +BROTHER  CABG ALIVE; PUNCLE DECEASED MI  M UNCLE --CAD; MUNCLE DECEASED MI HBP: +MOTHER ; +BROTHER DM: +MOTHER AT AGE 3// +M UNCLE GOUT/ARTHRITIS: M UNCLE PROSTATE CANCER: NEGATIVE BREAST/OVARIAN/UTERINE CANCER: NEGATIVE COLON CANCER: MGF DECEASED FROM COLON CANCER DEPRESSION: NEGATIVE ETOH/DRUG ABUSE: NEGATIVE OTHER + STROKE  PGF  Review of Systems General:  Denies chills, fatigue, fever, loss of appetite, malaise, sleep disorder, sweats, weakness, and weight loss. Eyes:  Denies blurring, discharge, double vision, eye irritation, eye pain, halos, itching, light sensitivity, red eye, vision loss-1 eye, and vision loss-both eyes. ENT:  Complains of ringing in ears; denies decreased hearing, difficulty swallowing, ear discharge, earache, hoarseness, nasal congestion, nosebleeds, postnasal drainage, sinus pressure, and sore throat. CV:  Denies bluish discoloration of lips or nails, chest pain or discomfort, difficulty breathing at night, difficulty breathing while lying down, fainting, fatigue, leg cramps with exertion, lightheadness, near fainting, palpitations, shortness of breath with exertion, swelling of feet, swelling of hands, and weight gain. Resp:  Denies chest discomfort, chest pain with inspiration, cough, coughing up blood, excessive snoring, hypersomnolence, morning headaches, pleuritic, shortness of breath, sputum productive, and wheezing. GI:  Denies abdominal pain, bloody stools, change in bowel habits, constipation, dark tarry stools, diarrhea, excessive appetite, gas, hemorrhoids, indigestion, loss of appetite, nausea, vomiting, vomiting blood, and yellowish skin color. GU:  Complains of nocturia; denies decreased libido, discharge, dysuria, erectile dysfunction, genital sores, hematuria, incontinence, urinary frequency, and urinary hesitancy; once on average.. MS:  Complains of low back pain; chronic. Derm:  Complains of itching; denies changes in color of skin, changes in nail beds, excessive perspiration, flushing, hair loss, insect bite(s), lesion(s), poor wound healing, and rash; Psoriasis and area on left cheek.. Neuro:  Complains of numbness and tingling; right foot.[name]  Physical Exam  General:  Well-developed,well-nourished,in no acute distress; alert,appropriate and cooperative throughout  examination Head:  Normocephalic and atraumatic without obvious abnormalities. No apparent alopecia or balding. Eyes:  Conjunctiva clear bilaterally.  Ears:  External ear exam shows no significant lesions or deformities.  Otoscopic examination reveals clear canals, tympanic membranes are intact bilaterally without bulging, retraction, inflammation or discharge. Hearing is grossly normal bilaterally. Nose:  External nasal examination shows no deformity or inflammation. Nasal mucosa are pink and moist without lesions or exudates. Mouth:  Oral mucosa and oropharynx without lesions or exudates.  Teeth in good repair. Neck:  No deformities, masses, or tenderness noted. Chest Wall:  No deformities, masses, tenderness or gynecomastia noted. Breasts:  No masses or gynecomastia noted Lungs:  Normal respiratory effort, chest expands symmetrically. Lungs are clear to auscultation, no crackles or wheezes. Heart:  Normal rate and regular rhythm. S1 and S2 normal without gallop, murmur, click, rub or other extra sounds. Abdomen:  Bowel sounds positive,abdomen soft and non-tender without masses, organomegaly or hernias noted. Rectal:  No external abnormalities noted. Normal sphincter tone. No rectal masses or tenderness. Genitalia:  Testes bilaterally descended without nodularity, tenderness or masses. No scrotal masses or lesions. No penis lesions or urethral discharge. Prostate:  Prostate gland firm and smooth, no enlargement, nodularity, tenderness, mass, asymmetry or induration. 20gms. Msk:  No deformity or scoliosis noted of thoracic or lumbar spine.   Pulses:  R and L carotid,radial,femoral,dorsalis pedis and posterior tibial pulses are full and equal bilaterally Extremities:  No clubbing, cyanosis, edema, or deformity noted with normal full range of motion of all joints.   Neurologic:  No cranial nerve deficits noted. Station and gait are normal. Plantar reflexes are down-going bilaterally. DTRs are  symmetrical  throughout. Sensory, motor and coordinative functions appear intact, except decreased sens on lat side of right foot. Skin:  turgor normal, color normal, and no rashes.   Cervical Nodes:  no anterior cervical adenopathy and no posterior cervical adenopathy.   Inguinal Nodes:  No significant adenopathy Psych:  normally interactive and good eye contact.     Impression & Recommendations:  Problem # 1:  HEALTH MAINTENANCE EXAM (ICD-V70.0) Assessment Comment Only  Orders: Gastroenterology Referral (GI)  Problem # 2:  PERIPHERAL NEUROPATHY, RIGHT LAT FOOT (ICD-355.8) Assessment: Unchanged Stable from the back. Back pain manageable.  Problem # 3:  DRY SKIN, FACE (ICD-757.39) Assessment: Unchanged Stable.  Problem # 4:  LOW BACK PAIN, CHRONIC (ICD-724.2) Assessment: Unchanged  Stable. occas exacerbated by work at the [employer].  Problem # 5:  HYPERGLYCEMIA (ICD-790.29) Assessment: Unchanged  Discussed avoiding sweets and carbs.  Labs Reviewed: Creat: 1.1 (03/26/2009)     Problem # 6:  PSORIASIS (ICD-696.1) Assessment: Unchanged Well controlled.  Problem # 7:  BENIGN PROSTATIC HYPERTROPHY (ICD-600.00) Assessment: Unchanged Stable with nocturia once a night.  Problem # 8:  HYPOTHYROIDISM (ICD-244.9) Assessment: Unchanged  Euthyroid on current dose. His updated medication list for this problem includes:    Levoxyl 88 Mcg Tabs (Levothyroxine sodium) .[name]... 1 tablet daily by mouth  Labs Reviewed: TSH: 0.92 (03/26/2009)   Free T4: 1.0 (03/26/2009)    Chol: 130 (03/26/2009)   HDL: 37.20 (03/26/2009)   LDL: 73 (03/26/2009)   TG: 101.0 (03/26/2009)  Problem # 9:  HYPERLIPIDEMIA (ICD-272.4) Assessment: Unchanged ADEQUATE ON CURR DOSE OF MEDS His updated medication list for this problem includes:    Vytorin 10-20 Mg Tabs (Ezetimibe-simvastatin) .[name]... 1 tablet at bedtime by mouth  Labs Reviewed: SGOT: 28 (03/26/2009)   SGPT: 24 (03/26/2009)   HDL:37.20  (03/26/2009), 30.5 (01/03/2008)  LDL:73 (03/26/2009), 66 (01/03/2008)  Chol:130 (03/26/2009), 124 (01/03/2008)  Trig:101.0 (03/26/2009), 136 (01/03/2008)  Problem # 10:  GERD (ICD-530.81) Assessment: Unchanged He feels improved and stabilized by his Noni juice intake. Continue. His updated medication list for this problem includes:    Protonix 40 Mg Tbec (Pantoprazole sodium) .[name]... 1 daily by mouth  Problem # 11:  ALLERGIC RHINITIS, CHRONIC SEASONAL AND PERENNIAL (ICD-477.9) Assessment: Improved Improved with allergy trmt. Tolerating Omnaris well. His updated medication list this problem includes:    Omnaris 50 Mcg/act Susp (Ciclesonide) .[name]... 1 spray each nostril prn    Clarinex 5 Mg Tabs (Desloratadine) .[name]... 1 daily by mouth as needed  Orders: Admin of Therapeutic Inj  intramuscular or subcutaneous (16109)  Complete Medication List: 1)  Protonix 40 Mg Tbec (Pantoprazole sodium) .[name].. 1 daily by mouth 2)  Levoxyl 88 Mcg Tabs (Levothyroxine sodium) .[name].. 1 tablet daily by mouth 3)  Vytorin 10-20 Mg Tabs (Ezetimibe-simvastatin) .[name].. 1 tablet at bedtime by mouth 4)  Omnaris 50 Mcg/act Susp (Ciclesonide) .[name].. 1 spray each nostril prn 5)  Clarinex 5 Mg Tabs (Desloratadine) .[name].. 1 daily by mouth as needed 6)  Noni Juice 3000 Mg/44ml Liqd (Noni (morinda citrifolia)) .... 30 mls daily by mouth  Patient Instructions: 1)  Refer to GI for colonoscopy.   Medication Administration  Injection # 1:    Medication: Allergy Injection (1)    Diagnosis: ALLERGIC RHINITIS, CHRONIC SEASONAL AND PERENNIAL (ICD-477.9)    Route: SQ    Site: R deltoid    Exp Date: 03/09/2010    Lot #: mold/ t-g-w    Mfr: [place]    Comments: 0.1 ml in each arm with  no signs of reaction    Given by: Cale Bethard LPN (March 27, 2009 11:01 AM)  Orders Added: 1)  Admin of Therapeutic Inj  intramuscular or subcutaneous [96372] 2)  Gastroenterology Referral [GI] 3)  Est. Patient 40-64 years [phone]

## 2010-10-01 NOTE — Assessment & Plan Note (Signed)
Summary: allergy shots/whc   Nurse Visit   Allergies: 1)  ! Penicillin 2)  ! Sulfa 3)  ! Keflex  Medication Administration  Injection # 1:    Medication: Allergy Injection (1)    Diagnosis: ALLERGIC RHINITIS, CHRONIC SEASONAL AND PERENNIAL (ICD-477.9)    Route: SQ    Site: L deltoid    Exp Date: 03/09/2010    Lot #: mold/ t.g.w.    Mfr: [place] allergy    Patient tolerated injection without complications    Given by: Devaughn Savant LPN (April 24, 2009 4:17 PM)  Orders Added: 1)  Admin of Therapeutic Inj  intramuscular or subcutaneous [[id number]]     Medication Administration  Injection # 1:    Medication: Allergy Injection (1)    Diagnosis: ALLERGIC RHINITIS, CHRONIC SEASONAL AND PERENNIAL (ICD-477.9)    Route: SQ    Site: L deltoid    Exp Date: 03/09/2010    Lot #: mold/ t.g.w.    Mfr: [place] allergy    Patient tolerated injection without complications    Given by: Kipton Skillen LPN (April 24, 2009 4:17 PM)  Orders Added: 1)  Admin of Therapeutic Inj  intramuscular or subcutaneous [[id number]]

## 2010-10-01 NOTE — Assessment & Plan Note (Signed)
Summary: SHOT/CLE   Nurse Visit    Prior Medications: PROTONIX 40 MG TBEC (PANTOPRAZOLE SODIUM) 1 DAILY BY MOUTH LEVOXYL 88 MCG TABS (LEVOTHYROXINE SODIUM) 1 TABLET DAILY BY MOUTH VYTORIN 10-20 MG TABS (EZETIMIBE-SIMVASTATIN) 1 TABLET AT BEDTIME BY MOUTH ASTELIN 137 MCG/SPRAY  SOLN (AZELASTINE HCL)  OMNARIS 50 MCG/ACT  SUSP (CICLESONIDE)  SINGULAIR 10 MG  TABS (MONTELUKAST SODIUM) 1 DAILY BY MOUTH CLARINEX 5 MG  TABS (DESLORATADINE) 1 DAILY BY MOUTH LYRICA 75 MG  CAPS (PREGABALIN) one tab by mouth tid Current Allergies: ! PENICILLIN ! SULFA ! KEFLEX    Medication Administration  Injection # 1:    Medication: Allergy Injection (1)    Diagnosis: ALLERGIC RHINITIS, CHRONIC SEASONAL AND PERENNIAL (ICD-477.9)    Route: SQ    Site: L deltoid    Exp Date: 02/27/2009    Lot #: T-G-W// MOLD    Mfr: [place] ALLERGY    Comments: 0.3 ML OF MOLD AND 0.3 ML OF T-G-W    Patient tolerated injection without complications    Given by: Burna Mortimer Griffin (May 31, 2008 3:07 PM)  Orders Added: 1)  Flu Vaccine 79yrs + [[id number]] 2)  Admin 1st Vaccine [[id number]] 3)  Admin of Therapeutic Inj  intramuscular or subcutaneous Lepidus.Putnam    ]  Influenza Vaccine    Vaccine Type: Fluvax 3+    Site: left deltoid    Mfr: GlaxoSmithKline    Dose: 0.5 ml    Route:     Given by: Providence Crosby    Exp. Date: 02/28/2009    Lot #: [id number]    VIS given: 03/25/07 version given May 31, 2008.  Flu Vaccine Consent Questions    Do you have a history of severe allergic reactions to this vaccine? no    Any prior history of allergic reactions to egg and/or gelatin? no    Do you have a sensitivity to the preservative Thimersol? no    Do you have a past history of Guillan-Barre Syndrome? no    Do you currently have an acute febrile illness? no    Have you ever had a severe reaction to latex? no    Vaccine information given and explained to patient? yes     Medication Administration  Injection # 1:    Medication: Allergy Injection (1)    Diagnosis: ALLERGIC RHINITIS, CHRONIC SEASONAL AND PERENNIAL (ICD-477.9)    Route: SQ    Site: L deltoid    Exp Date: 02/27/2009    Lot #: T-G-W// MOLD    Mfr: [place] ALLERGY    Comments: 0.3 ML OF MOLD AND 0.3 ML OF T-G-W    Patient tolerated injection without complications    Given by: Burna Mortimer Hightower (May 31, 2008 3:07 PM)  Orders Added: 1)  Flu Vaccine 46yrs + [[id number]] 2)  Admin 1st Vaccine [[id number]] 3)  Admin of Therapeutic Inj  intramuscular or subcutaneous [[id number]]

## 2010-10-01 NOTE — Assessment & Plan Note (Signed)
Summary: allergy shots/whc   Nurse Visit     Allergies: 1)  ! Penicillin 2)  ! Sulfa 3)  ! Keflex     Medication Administration  Injection # 1:    Medication: Allergy Injection (1)    Diagnosis: ALLERGIC RHINITIS, CHRONIC SEASONAL AND PERENNIAL (ICD-477.9)    Route: SQ    Site: L deltoid    Exp Date: 08/08/2009    Lot #: tgw/mold    Mfr: [place] allergy    Patient tolerated injection without complications    Given by: Keigo Whalley LPN (March 07, 1609 11:05 AM)  Orders Added: 1)  Admin of Therapeutic Inj  intramuscular or subcutaneous [[id number]]      Medication Administration  Injection # 1:    Medication: Allergy Injection (1)    Diagnosis: ALLERGIC RHINITIS, CHRONIC SEASONAL AND PERENNIAL (ICD-477.9)    Route: SQ    Site: L deltoid    Exp Date: 08/08/2009    Lot #: tgw/mold    Mfr: [place] allergy    Patient tolerated injection without complications    Given by: Niraj Kudrna LPN (March 08, 9603 11:05 AM)  Orders Added: 1)  Admin of Therapeutic Inj  intramuscular or subcutaneous [[id number]]

## 2010-10-01 NOTE — Letter (Signed)
Summary: Dr.Ranjan Sharma,[facility],Note  Dr.Ranjan Sharma,[facility],Note   Imported By: Beau Fanny 01/18/2010 15:33:47  _____________________________________________________________________  External Attachment:    Type:   Image     Comment:   External Document

## 2010-10-01 NOTE — Assessment & Plan Note (Signed)
Summary: allergy shot/whc   Nurse Visit     Allergies: 1)  ! Penicillin 2)  ! Sulfa 3)  ! Keflex     Medication Administration  Injection # 1:    Medication: Allergy Injection (1)    Diagnosis: ALLERGIC RHINITIS, CHRONIC SEASONAL AND PERENNIAL (ICD-477.9)    Route: SQ    Site: L deltoid    Exp Date: 08/08/2009    Lot #: tgw//mold    Mfr: [place]    Patient tolerated injection without complications    Given by: Burna Mortimer Stayer (November 15, 2008 11:02 AM)  Orders Added: 1)  Admin of Therapeutic Inj  intramuscular or subcutaneous Lepidus.Putnam    ]  Medication Administration  Injection # 1:    Medication: Allergy Injection (1)    Diagnosis: ALLERGIC RHINITIS, CHRONIC SEASONAL AND PERENNIAL (ICD-477.9)    Route: SQ    Site: L deltoid    Exp Date: 08/08/2009    Lot #: tgw//mold    Mfr: [place]    Patient tolerated injection without complications    Given by: Burna Mortimer Alkhatib (November 15, 2008 11:02 AM)  Orders Added: 1)  Admin of Therapeutic Inj  intramuscular or subcutaneous [[id number]]

## 2010-10-01 NOTE — Assessment & Plan Note (Signed)
Summary: allergy shot/whc   Nurse Visit   Allergies: 1)  ! Penicillin 2)  ! Sulfa 3)  ! Keflex  Medication Administration  Injection # 1:    Medication: Allergy Injection (1)    Diagnosis: ALLERGIC RHINITIS, CHRONIC SEASONAL AND PERENNIAL (ICD-477.9)    Route: SQ    Site: L deltoid    Exp Date: 03/09/2010    Lot #: tgw/mold    Mfr: [place]    Patient tolerated injection without complications    Given by: Beckham Capistran LPN (April 25, 2009 9:41 AM)  Orders Added: 1)  Admin of Therapeutic Inj  intramuscular or subcutaneous [[id number]]   Medication Administration  Injection # 1:    Medication: Allergy Injection (1)    Diagnosis: ALLERGIC RHINITIS, CHRONIC SEASONAL AND PERENNIAL (ICD-477.9)    Route: SQ    Site: L deltoid    Exp Date: 03/09/2010    Lot #: tgw/mold    Mfr: [place]    Patient tolerated injection without complications    Given by: Dayron Odland LPN (April 25, 2009 9:41 AM)  Orders Added: 1)  Admin of Therapeutic Inj  intramuscular or subcutaneous [[id number]]

## 2010-10-01 NOTE — Assessment & Plan Note (Signed)
Summary: allergy shots/whc   Nurse Visit    Prior Medications: PROTONIX 40 MG TBEC (PANTOPRAZOLE SODIUM) 1 DAILY BY MOUTH LEVOXYL 88 MCG TABS (LEVOTHYROXINE SODIUM) 1 TABLET DAILY BY MOUTH VYTORIN 10-20 MG TABS (EZETIMIBE-SIMVASTATIN) 1 TABLET AT BEDTIME BY MOUTH ASTELIN 137 MCG/SPRAY  SOLN (AZELASTINE HCL)  OMNARIS 50 MCG/ACT  SUSP (CICLESONIDE)  SINGULAIR 10 MG  TABS (MONTELUKAST SODIUM) 1 DAILY BY MOUTH CLARINEX 5 MG  TABS (DESLORATADINE) 1 DAILY BY MOUTH LYRICA 50 MG  CAPS (PREGABALIN) one cap by mouth three times a day Current Allergies: ! PENICILLIN ! SULFA ! KEFLEX    Medication Administration  Injection # 1:    Medication: Allergy Injection (1)    Diagnosis: ALLERGIC RHINITIS, CHRONIC SEASONAL AND PERENNIAL (ICD-477.9)    Route: SQ    Site: R deltoid    Exp Date: 09/19/2008    Lot #: t-g-w/mold    Mfr: [place]    Comments: .3 subqu     Patient tolerated injection without complications    Given by: Burna Mortimer Delaine (Jan 26, 2008 2:12 PM)  Orders Added: 1)  Allergy Injection (1) [95115] 2)  Admin of Therapeutic Inj  intramuscular or subcutaneous Lepidus.Putnam    ]  Medication Administration  Injection # 1:    Medication: Allergy Injection (1)    Diagnosis: ALLERGIC RHINITIS, CHRONIC SEASONAL AND PERENNIAL (ICD-477.9)    Route: SQ    Site: R deltoid    Exp Date: 09/19/2008    Lot #: t-g-w/mold    Mfr: [place]    Comments: .3 subqu     Patient tolerated injection without complications    Given by: Burna Mortimer Hippe (Jan 26, 2008 2:12 PM)  Orders Added: 1)  Allergy Injection (1) [95115] 2)  Admin of Therapeutic Inj  intramuscular or subcutaneous [[id number]]

## 2010-10-01 NOTE — Assessment & Plan Note (Signed)
Summary: allergy shot/whc   Nurse Visit    Prior Medications: PROTONIX 40 MG TBEC (PANTOPRAZOLE SODIUM) 1 DAILY BY MOUTH LEVOXYL 88 MCG TABS (LEVOTHYROXINE SODIUM) 1 TABLET DAILY BY MOUTH VYTORIN 10-20 MG TABS (EZETIMIBE-SIMVASTATIN) 1 TABLET AT BEDTIME BY MOUTH ASTELIN 137 MCG/SPRAY  SOLN (AZELASTINE HCL)  OMNARIS 50 MCG/ACT  SUSP (CICLESONIDE)  SINGULAIR 10 MG  TABS (MONTELUKAST SODIUM) 1 DAILY BY MOUTH CLARINEX 5 MG  TABS (DESLORATADINE) 1 DAILY BY MOUTH LYRICA 75 MG  CAPS (PREGABALIN) one tab by mouth tid Current Allergies: ! PENICILLIN ! SULFA ! KEFLEX    Medication Administration  Injection # 1:    Medication: Allergy Injection (1)    Diagnosis: ALLERGIC RHINITIS, CHRONIC SEASONAL AND PERENNIAL (ICD-477.9)    Route: SQ    Site: R deltoid    Exp Date: 02/27/2009    Lot #: t-g-w// mold    Mfr: [place] allergy    Comments: 0.3 ml of mold and 0.3 ml of t.g.w. given    Patient tolerated injection without complications    Given by: Burna Mortimer Salah (June 08, 2008 9:53 AM)  Orders Added: 1)  Admin of Therapeutic Inj  intramuscular or subcutaneous Lepidus.Putnam    ]  Medication Administration  Injection # 1:    Medication: Allergy Injection (1)    Diagnosis: ALLERGIC RHINITIS, CHRONIC SEASONAL AND PERENNIAL (ICD-477.9)    Route: SQ    Site: R deltoid    Exp Date: 02/27/2009    Lot #: t-g-w// mold    Mfr:  allergy    Comments: 0.3 ml of mold and 0.3 ml of t.g.w. given    Patient tolerated injection without complications    Given by: Burna Mortimer Padovano (June 08, 2008 9:53 AM)  Orders Added: 1)  Admin of Therapeutic Inj  intramuscular or subcutaneous [16109]

## 2010-10-01 NOTE — Assessment & Plan Note (Signed)
Summary: allergy shot/whc   Nurse Visit    Prior Medications: PROTONIX 40 MG TBEC (PANTOPRAZOLE SODIUM) 1 DAILY BY MOUTH LEVOXYL 88 MCG TABS (LEVOTHYROXINE SODIUM) 1 TABLET DAILY BY MOUTH VYTORIN 10-20 MG TABS (EZETIMIBE-SIMVASTATIN) 1 TABLET AT BEDTIME BY MOUTH ASTELIN 137 MCG/SPRAY  SOLN (AZELASTINE HCL)  OMNARIS 50 MCG/ACT  SUSP (CICLESONIDE)  SINGULAIR 10 MG  TABS (MONTELUKAST SODIUM) 1 DAILY BY MOUTH CLARINEX 5 MG  TABS (DESLORATADINE) 1 DAILY BY MOUTH LYRICA 75 MG  CAPS (PREGABALIN) one tab by mouth tid Current Allergies: ! PENICILLIN ! SULFA ! KEFLEX    Medication Administration  Injection # 1:    Medication: Allergy Injection (1)    Diagnosis: ALLERGIC RHINITIS, CHRONIC SEASONAL AND PERENNIAL (ICD-477.9)    Route: SQ    Site: R deltoid    Exp Date: 02/27/2009    Lot #: t-g-w/ mold    Mfr: [place] allergy    Comments: 0.43ml subqu in right deltoid of mold and t-g-w    Patient tolerated injection without complications    Given by: Burna Mortimer Folden (June 14, 2008 10:05 AM)  Orders Added: 1)  Admin of Therapeutic Inj  intramuscular or subcutaneous [96372] 2)  Admin of patients own med IM/SQ Pansy.Snowball    ]  Medication Administration  Injection # 1:    Medication: Allergy Injection (1)    Diagnosis: ALLERGIC RHINITIS, CHRONIC SEASONAL AND PERENNIAL (ICD-477.9)    Route: SQ    Site: R deltoid    Exp Date: 02/27/2009    Lot #: t-g-w/ mold    Mfr: [place] allergy    Comments: 0.24ml subqu in right deltoid of mold and t-g-w    Patient tolerated injection without complications    Given by: Burna Mortimer Sammons (June 14, 2008 10:05 AM)  Orders Added: 1)  Admin of Therapeutic Inj  intramuscular or subcutaneous [96372] 2)  Admin of patients own med IM/SQ [16109U]

## 2010-10-01 NOTE — Assessment & Plan Note (Signed)
Summary: ALLERGY SHOTS/[facility]   Nurse Visit     Allergies: 1)  ! Penicillin 2)  ! Sulfa 3)  ! Keflex     Medication Administration  Injection # 1:    Medication: Allergy Injection (1)    Diagnosis: ALLERGIC RHINITIS, CHRONIC SEASONAL AND PERENNIAL (ICD-477.9)    Route: SQ    Site: R deltoid    Exp Date: 08/08/2009    Lot #: T-G-W// MOLD    Mfr: [place]    Patient tolerated injection without complications    Given by: Burna Mortimer Eves (December 27, 2008 4:15 PM)  Orders Added: 1)  Admin of Therapeutic Inj  intramuscular or subcutaneous [[id number]]      Medication Administration  Injection # 1:    Medication: Allergy Injection (1)    Diagnosis: ALLERGIC RHINITIS, CHRONIC SEASONAL AND PERENNIAL (ICD-477.9)    Route: SQ    Site: R deltoid    Exp Date: 08/08/2009    Lot #: T-G-W// MOLD    Mfr: [place]    Patient tolerated injection without complications    Given by: Burna Mortimer Ton (December 27, 2008 4:15 PM)  Orders Added: 1)  Admin of Therapeutic Inj  intramuscular or subcutaneous [[id number]]

## 2010-10-01 NOTE — Assessment & Plan Note (Signed)
Summary: allergy shot/whc   Nurse Visit    Prior Medications: PROTONIX 40 MG TBEC (PANTOPRAZOLE SODIUM) 1 DAILY BY MOUTH LEVOXYL 88 MCG TABS (LEVOTHYROXINE SODIUM) 1 TABLET DAILY BY MOUTH VYTORIN 10-20 MG TABS (EZETIMIBE-SIMVASTATIN) 1 TABLET AT BEDTIME BY MOUTH ASTELIN 137 MCG/SPRAY  SOLN (AZELASTINE HCL)  OMNARIS 50 MCG/ACT  SUSP (CICLESONIDE)  SINGULAIR 10 MG  TABS (MONTELUKAST SODIUM) 1 DAILY BY MOUTH CLARINEX 5 MG  TABS (DESLORATADINE) 1 DAILY BY MOUTH LYRICA 75 MG  CAPS (PREGABALIN) one tab by mouth tid Current Allergies: ! PENICILLIN ! SULFA ! KEFLEX    Medication Administration  Injection # 1:    Medication: Allergy Injection (1)    Diagnosis: ALLERGIC RHINITIS, CHRONIC SEASONAL AND PERENNIAL (ICD-477.9)    Route: SQ    Site: R deltoid    Exp Date: 02/27/2009    Lot #: t-g-w//mold    Mfr: [place] allergy    Comments: 0.44mold // 0.3 t.g.w slight reddness at side of t-g-w    Patient tolerated injection without complications    Given by: Burna Mortimer Nishikawa (April 20, 2008 3:10 PM)  Orders Added: 1)  Admin of Therapeutic Inj  intramuscular or subcutaneous Lepidus.Putnam    ]  Medication Administration  Injection # 1:    Medication: Allergy Injection (1)    Diagnosis: ALLERGIC RHINITIS, CHRONIC SEASONAL AND PERENNIAL (ICD-477.9)    Route: SQ    Site: R deltoid    Exp Date: 02/27/2009    Lot #: t-g-w//mold    Mfr: [place] allergy    Comments: 0.14mold // 0.3 t.g.w slight reddness at side of t-g-w    Patient tolerated injection without complications    Given by: Burna Mortimer Lutterman (April 20, 2008 3:10 PM)  Orders Added: 1)  Admin of Therapeutic Inj  intramuscular or subcutaneous [16109]

## 2010-10-01 NOTE — Miscellaneous (Signed)
Summary: [facility]/Dr. [place] Callas   Imported By: Eleonore Chiquito 03/20/2008 15:51:26  _____________________________________________________________________  External Attachment:    Type:   Image     Comment:   External Document

## 2010-10-01 NOTE — Assessment & Plan Note (Signed)
Summary: allergy shot/whc   Nurse Visit     Allergies: 1)  ! Penicillin 2)  ! Sulfa 3)  ! Keflex     Medication Administration  Injection # 1:    Medication: Allergy Injection (1)    Diagnosis: ALLERGIC RHINITIS, CHRONIC SEASONAL AND PERENNIAL (ICD-477.9)    Route: SQ    Site: R deltoid    Exp Date: 08/08/2009    Lot #: t-g-w//mold    Mfr: [place]    Patient tolerated injection without complications    Given by: Burna Mortimer Mathies (December 06, 2008 3:50 PM)  Orders Added: 1)  Admin of Therapeutic Inj  intramuscular or subcutaneous Lepidus.Putnam    ]  Medication Administration  Injection # 1:    Medication: Allergy Injection (1)    Diagnosis: ALLERGIC RHINITIS, CHRONIC SEASONAL AND PERENNIAL (ICD-477.9)    Route: SQ    Site: R deltoid    Exp Date: 08/08/2009    Lot #: t-g-w//mold    Mfr: [place]    Patient tolerated injection without complications    Given by: Burna Mortimer Coltrin (December 06, 2008 3:50 PM)  Orders Added: 1)  Admin of Therapeutic Inj  intramuscular or subcutaneous [[id number]]

## 2010-10-01 NOTE — Assessment & Plan Note (Signed)
Summary: allergy shot/whc   Nurse Visit    Prior Medications: PROTONIX 40 MG TBEC (PANTOPRAZOLE SODIUM) 1 DAILY BY MOUTH LEVOXYL 88 MCG TABS (LEVOTHYROXINE SODIUM) 1 TABLET DAILY BY MOUTH VYTORIN 10-20 MG TABS (EZETIMIBE-SIMVASTATIN) 1 TABLET AT BEDTIME BY MOUTH OMNARIS 50 MCG/ACT  SUSP (CICLESONIDE)  CLARINEX 5 MG  TABS (DESLORATADINE) 1 DAILY BY MOUTH HYCODAN () 1 tsp at bedtime and repeat in 4-6hrs if needed for cough Current Allergies: ! PENICILLIN ! SULFA ! KEFLEX    Medication Administration  Injection # 1:    Medication: Allergy Injection (1)    Diagnosis: ALLERGIC RHINITIS, CHRONIC SEASONAL AND PERENNIAL (ICD-477.9)    Route: ID    Site: L deltoid    Exp Date: 08/08/2009    Lot #: mold/tgw    Mfr: [place]    Patient tolerated injection without complications    Given by: Burna Mortimer Cristina (November 08, 2008 2:28 PM)  Orders Added: 1)  Admin of Therapeutic Inj  intramuscular or subcutaneous Lepidus.Putnam    ]  Medication Administration  Injection # 1:    Medication: Allergy Injection (1)    Diagnosis: ALLERGIC RHINITIS, CHRONIC SEASONAL AND PERENNIAL (ICD-477.9)    Route: ID    Site: L deltoid    Exp Date: 08/08/2009    Lot #: mold/tgw    Mfr: [place]    Patient tolerated injection without complications    Given by: Burna Mortimer Lybbert (November 08, 2008 2:28 PM)  Orders Added: 1)  Admin of Therapeutic Inj  intramuscular or subcutaneous [[id number]]

## 2010-10-01 NOTE — Assessment & Plan Note (Signed)
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 Last visit thought to have either cervical neck strain or possible mild herniated disc.  Treated conservatively with ice, rest, NSAID and flexeril.  Presents today for f/u, feeling 100% better.  Thinks could go back to work.   No shooting pain down arm, no paresthesias in arms/fingers.  no weakness.  has been trying to take it easy at work.  Allergies: 1)  ! Penicillin 2)  ! Sulfa 3)  ! Keflex PMH-FH-SH reviewed for relevance  Physical Exam  General:  Well-developed,well-nourished,in no acute distress; alert,appropriate and cooperative throughout examination Msk:  no midline cervical tenderness or paraspinous mm tenderness.  FROM at neck.  negative spurling test.   Impression & Recommendations:  Problem # 1:  CERVICALGIA (ICD-723.1) likely cervical neck strain.  refill naprosyn/flexeril in case needed.  advised to return if pain returns o/w f/u as needed.  states doesn't think will need note for work.  continue handout exercises  His updated medication list for this problem includes:    Naprosyn 500 Mg Tabs (Naproxen) .[name]... Take one by mouth two times a day with food x 10 days then as needed    Flexeril 10 Mg Tabs (Cyclobenzaprine hcl) .[name]... Take one by mouth two times a day as needed muscle spasm sedation precautions  Complete Medication List: 1)  Protonix 40 Mg Tbec (Pantoprazole sodium) .[name].. 1 daily by mouth 2)  Levoxyl 88 Mcg Tabs (Levothyroxine sodium) .[name].. 1 tablet  daily by mouth 3)  Vytorin 10-20 Mg Tabs (Ezetimibe-simvastatin) .[name].. 1 tablet at bedtime by mouth 4)  Omnaris 50 Mcg/act Susp (Ciclesonide) .[name].. 1 spray each nostril as needed 5)  Clarinex 5 Mg Tabs (Desloratadine) .[name].. 1 daily by mouth as needed 6)  B Complex-c Caps (B complex-c) .... One a day 7)  Vitamin C 500 Mg Tabs (Ascorbic acid) .... One a day 8)  Naprosyn 500 Mg Tabs (Naproxen) .... Take one by mouth two times a day with food x 10 days then as needed 9)  Flexeril 10 Mg Tabs (Cyclobenzaprine hcl) .... Take one by mouth two times a day as needed muscle spasm sedation precautions  Patient Instructions: 1)  I'm glad you're feeling better.  You likely had a cervical strain. 2)  Let us know if starts bothering you again. 3)  Good to see you again.  Call clinic with questions. Prescriptions: FLEXERIL 10 MG TABS (CYCLOBENZAPRINE HCL) take one by mouth two times a day as needed muscle spasm sedation precautions  #30 x 0   Entered and Authorized by:   Eustaquio Boyden  MD   Signed by:   Eustaquio Boyden  MD on 06/14/2010   Method used:   Print then Give to Patient   RxID:   [id number] NAPROSYN 500 MG TABS (NAPROXEN) take one by mouth two times a day with food x 10 days then as  needed  #30 x 0   Entered and Authorized by:   Eustaquio Boyden  MD   Signed by:   Eustaquio Boyden  MD on 06/14/2010   Method used:   Print then Give to Patient   RxID:   [id number] FLEXERIL 10 MG TABS (CYCLOBENZAPRINE HCL) take one by mouth two times a day as needed muscle spasm sedation precautions  #30 x 0   Entered and Authorized by:   Eustaquio Boyden  MD   Signed by:   Eustaquio Boyden  MD on 06/14/2010   Method used:   Electronically to        [facility] (retail)       [address]       [place], [place]  [address]       Ph: [phone]       Fax: [phone]   RxID:   [id number] NAPROSYN 500 MG TABS (NAPROXEN) take one by mouth two times a day  with food x 10 days then as needed  #30 x 0   Entered and Authorized by:   Eustaquio Boyden  MD   Signed by:   Eustaquio Boyden  MD on 06/14/2010   Method used:   Electronically to        [facility] (retail)       [address]       [place], [place]  [address]       Ph: [phone]       Fax: [phone]   RxID:   [id number]   Current Allergies (reviewed today): ! PENICILLIN ! SULFA ! KEFLEX

## 2010-10-01 NOTE — Assessment & Plan Note (Signed)
Summary: ALLERGY SHOTS/[facility]   Nurse Visit    Prior Medications: PROTONIX 40 MG TBEC (PANTOPRAZOLE SODIUM) 1 DAILY BY MOUTH LEVOXYL 88 MCG TABS (LEVOTHYROXINE SODIUM) 1 TABLET DAILY BY MOUTH VYTORIN 10-20 MG TABS (EZETIMIBE-SIMVASTATIN) 1 TABLET AT BEDTIME BY MOUTH ASTELIN 137 MCG/SPRAY  SOLN (AZELASTINE HCL)  OMNARIS 50 MCG/ACT  SUSP (CICLESONIDE)  SINGULAIR 10 MG  TABS (MONTELUKAST SODIUM) 1 DAILY BY MOUTH CLARINEX 5 MG  TABS (DESLORATADINE) 1 DAILY BY MOUTH LYRICA 75 MG  CAPS (PREGABALIN) one tab by mouth tid Current Allergies: ! PENICILLIN ! SULFA ! KEFLEX    Medication Administration  Injection # 1:    Medication: Allergy Injection (1)    Diagnosis: ALLERGIC RHINITIS, CHRONIC SEASONAL AND PERENNIAL (ICD-477.9)    Route: SQ    Site: L deltoid    Exp Date: 02/27/2009    Lot #: mold/ tgw    Mfr: [place] allergy    Comments: 0.3 sub qu in right arm mold// 0.3 t/g/w in left arm with slight reddness at site    Patient tolerated injection without complications    Given by: Burna Mortimer Radler (May 04, 2008 3:49 PM)  Orders Added: 1)  Allergy Injection (1) [95115] 2)  Admin of Therapeutic Inj  intramuscular or subcutaneous Lepidus.Putnam    ]    Medication Administration  Injection # 1:    Medication: Allergy Injection (1)    Diagnosis: ALLERGIC RHINITIS, CHRONIC SEASONAL AND PERENNIAL (ICD-477.9)    Route: SQ    Site: L deltoid    Exp Date: 02/27/2009    Lot #: mold/ tgw    Mfr: [place] allergy    Comments: 0.3 sub qu in right arm mold// 0.3 t/g/w in left arm with slight reddness at site    Patient tolerated injection without complications    Given by: Burna Mortimer Hurn (May 04, 2008 3:49 PM)  Orders Added: 1)  Allergy Injection (1) [95115] 2)  Admin of Therapeutic Inj  intramuscular or subcutaneous [[id number]]

## 2010-10-01 NOTE — Assessment & Plan Note (Signed)
Summary: allergy shot/[facility]   Nurse Visit     Allergies: 1)  ! Penicillin 2)  ! Sulfa 3)  ! Keflex     Medication Administration  Injection # 1:    Medication: Allergy Injection (1)    Diagnosis: ALLERGIC RHINITIS, CHRONIC SEASONAL AND PERENNIAL (ICD-477.9)    Route: SQ    Site: R deltoid    Exp Date: 08/08/2009    Lot #: mold and wood    Mfr: [place]    Patient tolerated injection without complications    Given by: Burna Mortimer Briski (November 22, 2008 4:14 PM)  Orders Added: 1)  Admin of Therapeutic Inj  intramuscular or subcutaneous Lepidus.Putnam    ]  Medication Administration  Injection # 1:    Medication: Allergy Injection (1)    Diagnosis: ALLERGIC RHINITIS, CHRONIC SEASONAL AND PERENNIAL (ICD-477.9)    Route: SQ    Site: R deltoid    Exp Date: 08/08/2009    Lot #: mold and wood    Mfr: [place]    Patient tolerated injection without complications    Given by: Burna Mortimer Bhandari (November 22, 2008 4:14 PM)  Orders Added: 1)  Admin of Therapeutic Inj  intramuscular or subcutaneous [[id number]]

## 2010-10-01 NOTE — Assessment & Plan Note (Signed)
Summary: ALLERGY SHOT/[facility]   Nurse Visit    Prior Medications: PROTONIX 40 MG TBEC (PANTOPRAZOLE SODIUM) 1 DAILY BY MOUTH LEVOXYL 88 MCG TABS (LEVOTHYROXINE SODIUM) 1 TABLET DAILY BY MOUTH VYTORIN 10-20 MG TABS (EZETIMIBE-SIMVASTATIN) 1 TABLET AT BEDTIME BY MOUTH ASTELIN 137 MCG/SPRAY  SOLN (AZELASTINE HCL)  OMNARIS 50 MCG/ACT  SUSP (CICLESONIDE)  SINGULAIR 10 MG  TABS (MONTELUKAST SODIUM) 1 DAILY BY MOUTH CLARINEX 5 MG  TABS (DESLORATADINE) 1 DAILY BY MOUTH LYRICA 75 MG  CAPS (PREGABALIN) one tab by mouth tid Current Allergies: ! PENICILLIN ! SULFA ! KEFLEX    Medication Administration  Injection # 1:    Medication: Allergy Injection (1)    Diagnosis: ALLERGIC RHINITIS, CHRONIC SEASONAL AND PERENNIAL (ICD-477.9)    Route: SQ    Site: L deltoid    Exp Date: 02/27/2009    Lot #: t-g-w/mold    Mfr: [place] allergy    Patient tolerated injection without complications    Given by: Burna Mortimer Padmore (August 16, 2008 9:49 AM)  Orders Added: 1)  Admin of Therapeutic Inj  intramuscular or subcutaneous [[id number]] 2)  Admin of patients own med IM/SQ Pansy.Snowball    ]  Medication Administration  Injection # 1:    Medication: Allergy Injection (1)    Diagnosis: ALLERGIC RHINITIS, CHRONIC SEASONAL AND PERENNIAL (ICD-477.9)    Route: SQ    Site: L deltoid    Exp Date: 02/27/2009    Lot #: t-g-w/mold    Mfr: [place] allergy    Patient tolerated injection without complications    Given by: Burna Mortimer Waldrop (August 16, 2008 9:49 AM)  Orders Added: 1)  Admin of Therapeutic Inj  intramuscular or subcutaneous [[id number]] 2)  Admin of patients own med IM/SQ [phone]

## 2010-10-01 NOTE — Assessment & Plan Note (Signed)
Summary: check up/whc   Vital Signs:  Patient Profile:   55 Years Old Male Height:     69 inches (175.26 cm) Weight:      185 pounds Temp:     98.3 degrees F oral Pulse rate:   76 / minute Pulse rhythm:   regular BP sitting:   120 / 84  (left arm) Cuff size:   regular  Vitals Entered By: Providence Crosby (Jan 05, 2008 10:53 AM)                 Chief Complaint:  CHECK UP// HEMOCCULT CARDS TO PATIENT.  History of Present Illness: Here for Comp Exam, having some dry skin of left maxillary area. Has chronic pain in side and bottom of right foot.  Wondering about new medicine to help.    Prior Medications Reviewed Using: Patient Recall  Current Allergies (reviewed today): ! PENICILLIN ! SULFA ! KEFLEX    Risk Factors:  Passive smoke exposure:  no Drug use:  no HIV high-risk behavior:  no Caffeine use:  1 drinks per day Alcohol use:  no Exercise:  no Seatbelt use:  100 %   Review of Systems  Eyes      Denies blurring, discharge, double vision, eye irritation, eye pain, halos, itching, light sensitivity, red eye, vision loss-1 eye, and vision loss-both eyes.  ENT      Complains of ringing in ears.      Denies decreased hearing, difficulty swallowing, ear discharge, earache, hoarseness, nasal congestion, nosebleeds, postnasal drainage, sinus pressure, and sore throat.  CV      Denies bluish discoloration of lips or nails, chest pain or discomfort, difficulty breathing at night, difficulty breathing while lying down, fainting, fatigue, leg cramps with exertion, lightheadness, near fainting, palpitations, shortness of breath with exertion, swelling of feet, swelling of hands, and weight gain.  Resp      Denies chest discomfort, chest pain with inspiration, cough, coughing up blood, excessive snoring, hypersomnolence, morning headaches, pleuritic, shortness of breath, sputum productive, and wheezing.  GI      Denies abdominal pain, bloody stools, change in bowel  habits, constipation, dark tarry stools, diarrhea, excessive appetite, gas, hemorrhoids, indigestion, loss of appetite, nausea, vomiting, vomiting blood, and yellowish skin color.  GU      Complains of nocturia and urinary frequency.      Denies decreased libido, discharge, dysuria, erectile dysfunction, genital sores, hematuria, incontinence, and urinary hesitancy.      once-twice  MS      Complains of low back pain and stiffness.      Denies joint pain, joint redness, joint swelling, loss of strength, mid back pain, muscle aches, muscle , cramps, muscle weakness, and thoracic pain.  Derm      Denies changes in color of skin, changes in nail beds, dryness, excessive perspiration, flushing, hair loss, insect bite(s), itching, lesion(s), poor wound healing, and rash.      small area of dryness on left lower malar area.  Neuro      Denies brief paralysis, difficulty with concentration, disturbances in coordination, falling down, headaches, inability to speak, memory loss, numbness, poor balance, seizures, sensation of room spinning, tingling, tremors, visual disturbances, and weakness.   Physical Exam  General:     Well-developed,well-nourished,in no acute distress; alert,appropriate and cooperative throughout examination Head:     Normocephalic and atraumatic without obvious abnormalities. No apparent alopecia or balding. Eyes:     Conjunctiva clear bilaterally.  Ears:  External ear exam shows no significant lesions or deformities.  Otoscopic examination reveals clear canals, tympanic membranes are intact bilaterally without bulging, retraction, inflammation or discharge. Hearing is grossly normal bilaterally. Nose:     External nasal examination shows no deformity or inflammation. Nasal mucosa are pink and moist without lesions or exudates. Mouth:     Oral mucosa and oropharynx without lesions or exudates.  Teeth in good repair. Neck:     No deformities, masses, or tenderness  noted. Chest Wall:     No deformities, masses, tenderness or gynecomastia noted. Breasts:     No masses or gynecomastia noted Lungs:     Normal respiratory effort, chest expands symmetrically. Lungs are clear to auscultation, no crackles or wheezes. Heart:     Normal rate and regular rhythm. S1 and S2 normal without gallop, murmur, click, rub or other extra sounds. Abdomen:     Bowel sounds positive,abdomen soft and non-tender without masses, organomegaly or hernias noted. Rectal:     No external abnormalities noted. Normal sphincter tone. No rectal masses or tenderness. G neg. Genitalia:     Testes bilaterally descended without nodularity, tenderness or masses. No scrotal masses or lesions. No penis lesions or urethral discharge. Prostate:     Prostate gland firm and smooth, no enlargement, nodularity, tenderness, mass, asymmetry or induration. 20gms. Msk:     No deformity or scoliosis noted of thoracic or lumbar spine.   Pulses:     R and L carotid,radial,femoral,dorsalis pedis and posterior tibial pulses are full and equal bilaterally Extremities:     No clubbing, cyanosis, edema, or deformity noted with normal full range of motion of all joints.   Neurologic:     No cranial nerve deficits noted. Station and gait are normal. Plantar reflexes are down-going bilaterally. DTRs are symmetrical throughout. Sensory, motor and coordinative functions appear intact, except decreased sens on lat side of right foot. Skin:     Intact without suspicious lesions or rashes, sparse AKs. Cervical Nodes:     No lymphadenopathy noted Inguinal Nodes:     No significant adenopathy Psych:     Cognition and judgment appear intact. Alert and cooperative with normal attention span and concentration. No apparent delusions, illusions, hallucinations    Impression & Recommendations:  Problem # 1:  HEALTH MAINTENANCE EXAM (ICD-V70.0) Assessment: Comment Only  Problem # 2:  LOW BACK PAIN, ACUTE  (ICD-724.2) Assessment: Improved Reasonably controlled since surgery...has to be careful with movement at work.  Problem # 3:  HYPERGLYCEMIA (ICD-790.29) Assessment: Unchanged Continues but stable. Microalb nml. Labs Reviewed: Creat: 1.0 (01/03/2008)      Problem # 4:  PSORIASIS (ICD-696.1) Assessment: Unchanged Legs principally, stable.  Problem # 5:  BENIGN PROSTATIC HYPERTROPHY (ICD-600.00) Assessment: Unchanged Not needing meds yet.  Problem # 6:  HYPOTHYROIDISM (ICD-244.9) Assessment: Improved Euthyroid on current dose. His updated medication list for this problem includes:    Levoxyl 88 Mcg Tabs (Levothyroxine sodium) .[name]... 1 tablet daily by mouth  Labs Reviewed: TSH: 1.89 (01/03/2008)   Total T4: 6.0 (01/03/2008)    Chol: 124 (01/03/2008)   HDL: 30.5 (01/03/2008)   LDL: 66 (01/03/2008)   TG: 136 (01/03/2008)   Problem # 7:  HYPERLIPIDEMIA (ICD-272.4) Assessment: Improved Good nnos except HDL. His updated medication list for this problem includes:    Vytorin 10-20 Mg Tabs (Ezetimibe-simvastatin) .[name]... 1 tablet at bedtime by mouth  Labs Reviewed: Chol: 124 (01/03/2008)   HDL: 30.5 (01/03/2008)   LDL: 66 (01/03/2008)   TG:  136 (01/03/2008) SGOT: 30 (01/03/2008)   SGPT: 32 (01/03/2008)   Problem # 8:  GERD (ICD-530.81) Assessment: Unchanged Stable. His updated medication list for this problem includes:    Protonix 40 Mg Tbec (Pantoprazole sodium) .[name]... 1 daily by mouth  Diagnostics Reviewed:  Discussed lifestyle modifications, diet, antacids/medications, and preventive measures. Handout provided.   Problem # 9:  ALLERGIC RHINITIS, CHRONIC SEASONAL AND PERENNIAL (ICD-477.9) Assessment: Improved Seems adequately controlled on current regimen. His updated medication list for this problem includes:    Astelin 137 Mcg/spray Soln (Azelastine hcl)    Omnaris 50 Mcg/act Susp (Ciclesonide)    Clarinex 5 Mg Tabs (Desloratadine) .[name]... 1 daily by  mouth  Orders: Allergy Injection (1) (60454) Admin of Therapeutic Inj  intramuscular or subcutaneous (09811) Discussed use of allergy medications and environmental measures.   Problem # 10:  DRY SKIN, FACE (ICD-757.39) Assessment: New Use emmollients as needed (currently using Vaseline successfully) and Cortaid 10 when needed.  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  Complete Medication List: 1)  Protonix 40 Mg Tbec (Pantoprazole sodium) .[name].. 1 daily by mouth 2)  Levoxyl 88 Mcg Tabs (Levothyroxine sodium) .[name].. 1 tablet daily by mouth 3)  Vytorin 10-20 Mg Tabs (Ezetimibe-simvastatin) .[name].. 1 tablet at bedtime by mouth 4)  Astelin 137 Mcg/spray Soln (Azelastine hcl) 5)  Omnaris 50 Mcg/act Susp (Ciclesonide) 6)  Singulair 10 Mg Tabs (Montelukast sodium) .[name].. 1 daily by mouth 7)  Clarinex 5 Mg Tabs (Desloratadine) .[name].. 1 daily by mouth 8)  Lyrica 50 Mg Caps (Pregabalin) .... One cap by mouth three times a day   Patient Instructions: 1)  Call with response to Lyrica. May incr to 100mg . 2)  O/W, RTC as needed.   Prescriptions: LYRICA 50 MG  CAPS (PREGABALIN) one cap by mouth three times a day  #90 x 0   Entered and Authorized by:   Shaune Leeks MD   Signed by:   Shaune Leeks MD on 01/05/2008   Method used:   Electronically sent to ...       [facility]       [address]       [place], [place]  [address]       Ph: [phone]       Fax: [phone]   RxID:   [id number]  ]  Medication Administration  Injection # 1:    Medication: Allergy Injection (1)    Diagnosis: ALLERGIC RHINITIS, CHRONIC SEASONAL AND PERENNIAL (ICD-477.9)    Route: ID    Site: R deltoid    Exp Date: 09/09/2008    Lot #: [id number]    Mfr: [place]    Comments: 0.3ML SUB QU MOLD// TGW /0.3ML    Patient tolerated injection without complications    Given  by: Burna Mortimer Wickard (Jan 05, 2008 10:57 AM)  Orders Added: 1)  Allergy Injection (1) [95115] 2)  Admin of Therapeutic Inj  intramuscular or subcutaneous [96372] 3)  Est. Patient 40-64 years [phone]

## 2010-10-01 NOTE — Assessment & Plan Note (Signed)
Summary: allergy shots/whc   Nurse Visit    Prior Medications: PROTONIX 40 MG TBEC (PANTOPRAZOLE SODIUM) 1 DAILY BY MOUTH LEVOXYL 88 MCG TABS (LEVOTHYROXINE SODIUM) 1 TABLET DAILY BY MOUTH VYTORIN 10-20 MG TABS (EZETIMIBE-SIMVASTATIN) 1 TABLET AT BEDTIME BY MOUTH ASTELIN 137 MCG/SPRAY  SOLN (AZELASTINE HCL)  OMNARIS 50 MCG/ACT  SUSP (CICLESONIDE)  SINGULAIR 10 MG  TABS (MONTELUKAST SODIUM) 1 DAILY BY MOUTH CLARINEX 5 MG  TABS (DESLORATADINE) 1 DAILY BY MOUTH LYRICA 75 MG  CAPS (PREGABALIN) one tab by mouth tid Current Allergies: ! PENICILLIN ! SULFA ! KEFLEX    Medication Administration  Injection # 1:    Medication: Allergy Injection (1)    Diagnosis: ALLERGIC RHINITIS, CHRONIC SEASONAL AND PERENNIAL (ICD-477.9)    Route: SQ    Site: L deltoid    Exp Date: 02/27/2009    Lot #: t/g/w--mold    Mfr: [place] allergy    Comments: 0.3 ml each    Patient tolerated injection without complications    Given by: Burna Mortimer Duthie (February 24, 2008 7:55 AM)  Orders Added: 1)  Admin of Therapeutic Inj  intramuscular or subcutaneous [email or web address].[place]    ]  Medication Administration  Injection # 1:    Medication: Allergy Injection (1)    Diagnosis: ALLERGIC RHINITIS, CHRONIC SEASONAL AND PERENNIAL (ICD-477.9)    Route: SQ    Site: L deltoid    Exp Date: 02/27/2009    Lot #: t/g/w--mold    Mfr: [place] allergy    Comments: 0.3 ml each    Patient tolerated injection without complications    Given by: Burna Mortimer Benison (February 24, 2008 7:55 AM)  Orders Added: 1)  Admin of Therapeutic Inj  intramuscular or subcutaneous [16109]

## 2010-10-01 NOTE — Letter (Signed)
Summary: [facility] - started amitriptyline for spermatocele pain  [facility]   Imported By: Lanelle Bal 07/02/2010 14:43:37  _____________________________________________________________________  External Attachment:    Type:   Image     Comment:   External Document

## 2010-10-01 NOTE — Assessment & Plan Note (Signed)
Summary: allergy shots/whc   Nurse Visit    Prior Medications: PROTONIX 40 MG TBEC (PANTOPRAZOLE SODIUM) 1 DAILY BY MOUTH LEVOXYL 88 MCG TABS (LEVOTHYROXINE SODIUM) 1 TABLET DAILY BY MOUTH VYTORIN 10-20 MG TABS (EZETIMIBE-SIMVASTATIN) 1 TABLET AT BEDTIME BY MOUTH ASTELIN 137 MCG/SPRAY  SOLN (AZELASTINE HCL)  OMNARIS 50 MCG/ACT  SUSP (CICLESONIDE)  SINGULAIR 10 MG  TABS (MONTELUKAST SODIUM) 1 DAILY BY MOUTH CLARINEX 5 MG  TABS (DESLORATADINE) 1 DAILY BY MOUTH LYRICA 75 MG  CAPS (PREGABALIN) one tab by mouth tid Current Allergies: ! PENICILLIN ! SULFA ! KEFLEX    Medication Administration  Injection # 1:    Medication: Allergy Injection (1)    Diagnosis: ALLERGIC RHINITIS, CHRONIC SEASONAL AND PERENNIAL (ICD-477.9)    Route: SQ    Site: R deltoid    Exp Date: 02/27/2009    Lot #: tgw// mold    Mfr: [place] allergy    Comments: 0.3 ml mold in right deiltoid no reaction 0.3 t/g/w inm left deltoid no reaction// brought medications with him    Patient tolerated injection without complications    Given by: Burna Mortimer Rehberg (May 11, 2008 9:30 AM)  Orders Added: 1)  Admin of Therapeutic Inj  intramuscular or subcutaneous [email or web address].[place]    ]  Medication Administration  Injection # 1:    Medication: Allergy Injection (1)    Diagnosis: ALLERGIC RHINITIS, CHRONIC SEASONAL AND PERENNIAL (ICD-477.9)    Route: SQ    Site: R deltoid    Exp Date: 02/27/2009    Lot #: tgw// mold    Mfr: [place] allergy    Comments: 0.3 ml mold in right deiltoid no reaction 0.3 t/g/w inm left deltoid no reaction// brought medications with him    Patient tolerated injection without complications    Given by: Burna Mortimer Swartout (May 11, 2008 9:30 AM)  Orders Added: 1)  Admin of Therapeutic Inj  intramuscular or subcutaneous [16109]

## 2010-10-01 NOTE — Assessment & Plan Note (Signed)
Summary: CPX/CLE   Vital Signs:  Patient profile:   55 year old male Weight:      185.75 pounds Temp:     98.5 degrees F oral Pulse rate:   84 / minute Pulse rhythm:   regular BP sitting:   120 / 84  (left arm) Cuff size:   large  Vitals Entered By: Sydell Axon LPN (July 24, 2010 2:47 PM) CC: 30 Minute checkup, irregular heart beat at times   History of Present Illness: Pt here for Comp Exam. He was having palpitations a few weeks ago and had an EKG done with multiple  PVCs. He had had caffeine that day and has since stopped. He had been feeling the funny heartbeats since being seen at Urology. He had checked his pulse at noticed irregulariyty and has been checking since.  He has no other complaints except his knee. He has been putting down flooring over the last few weeks at work. His knees are swollen and slightly tender.  He uses Flexeril occas for muscle pains....had been having neck irritation previously.  Preventive Screening-Counseling & Management  Alcohol-Tobacco     Alcohol drinks/day: 0     Smoking Status: never     Passive Smoke Exposure: no  Caffeine-Diet-Exercise     Caffeine use/day: 0     Does Patient Exercise: no  Problems Prior to Update: 1)  Cervicalgia  (ICD-723.1) 2)  Special Screening [place] Neoplasms Other Sites  (ICD-V76.49) 3)  Peripheral Neuropathy, Right Lat Foot  (ICD-355.8) 4)  Dry Skin, Face  (ICD-757.39) 5)  Health Maintenance Exam  (ICD-V70.0) 6)  Low Back Pain, Chronic  (ICD-724.2) 7)  Hyperglycemia  (ICD-790.29) 8)  Psoriasis  (ICD-696.1) 9)  Benign Prostatic Hypertrophy  (ICD-600.00) 10)  Hypothyroidism  (ICD-244.9) 11)  Hyperlipidemia  (ICD-272.4) 12)  Gerd  (ICD-530.81) 13)  Allergic Rhinitis, Chronic Seasonal and Perennial  (ICD-477.9)  Medications Prior to Update: 1)  Protonix 40 Mg Tbec (Pantoprazole Sodium) .[name].. 1 Daily By Mouth 2)  Levoxyl 88 Mcg Tabs (Levothyroxine Sodium) .[name].. 1 Tablet Daily By Mouth 3)  Vytorin 10-20  Mg Tabs (Ezetimibe-Simvastatin) .[name].. 1 Tablet At Bedtime By Mouth 4)  Omnaris 50 Mcg/act  Susp (Ciclesonide) .[name].. 1 Spray Each Nostril As Needed 5)  Clarinex 5 Mg  Tabs (Desloratadine) .[name].. 1 Daily By Mouth As Needed 6)  B Complex-C  Caps (B Complex-C) .... One A Day 7)  Vitamin C 500 Mg Tabs (Ascorbic Acid) .... One A Day 8)  Naprosyn 500 Mg Tabs (Naproxen) .... Take One By Mouth Two Times A Day With Food X 10 Days Then As Needed 9)  Flexeril 10 Mg Tabs (Cyclobenzaprine Hcl) .... Take One By Mouth Two Times A Day As Needed Muscle Spasm Sedation Precautions  Current Medications (verified): 1)  Protonix 40 Mg Tbec (Pantoprazole Sodium) .[name].. 1 Daily By Mouth 2)  Levoxyl 88 Mcg Tabs (Levothyroxine Sodium) .[name].. 1 Tablet Daily By Mouth 3)  Vytorin 10-20 Mg Tabs (Ezetimibe-Simvastatin) .[name].. 1 Tablet At Bedtime By Mouth 4)  Omnaris 50 Mcg/act  Susp (Ciclesonide) .[name].. 1 Spray Each Nostril As Needed 5)  Clarinex 5 Mg  Tabs (Desloratadine) .[name].. 1 Daily By Mouth As Needed 6)  B Complex-C  Caps (B Complex-C) .... One A Day 7)  Vitamin C 500 Mg Tabs (Ascorbic Acid) .... One A Day 8)  Naprosyn 500 Mg Tabs (Naproxen) .... Take One By Mouth Two Times A Day With Food X 10 Days Then As Needed 9)  Flexeril  10 Mg Tabs (Cyclobenzaprine Hcl) .... Take One By Mouth Two Times A Day As Needed Muscle Spasm Sedation Precautions 10)  Allergy Shots .... Take One Injection Weekly  Allergies: 1)  ! Penicillin 2)  ! Sulfa 3)  ! Keflex  Past History:  Past Medical History: Last updated: 03/14/2010 GERD:(11/29/1998) Hyperlipidemia:(08/1997) Hypothyroidism:(1983) Benign prostatic hypertrophy:(08/1989) Spermatocele per Dr. Annabell Howells with [facility]  Past Surgical History: Last updated: 08/10/2009 gallbladder ULTRASOUND NORMAL:(04/1987) CYSTOSCOPY ; NORMAL :(08/1989) FLEX SIG; NORMAL:(10/1991) EGD; DISTAL ESOPH. STRICTURE ; SLIDING H.H.:(10/1999) LUMBAR MICRODISECTOMY :(DR. KRITZER):(08/2000) CATARACT LEFT :(03/2003)  LASIK RIGHT EYE:(04/12/2003) Colonoscopy Polyp B9 (Dr Jason Fila) 08/10/2009     5 years  Family History: Last updated: 07/24/2010 Father:ALIVE 85  CABG X 4 12/2001 Chol Htn Mother: ALIVE 69 , HTN   ELEVATED  CHOLESTEROL // REPEAT COLONOSCOPY'S (FH) DIABETIC AT AGE 57 BROTHER A 65 CABG X1; HTN CV: +BROTHER CABG ALIVE; PUNCLE DECEASED MI  M UNCLE --CAD; MUNCLE DECEASED MI HBP: +MOTHER ; +BROTHER DM: +MOTHER AT AGE 59// +M UNCLE GOUT/ARTHRITIS: M UNCLE PROSTATE CANCER: NEGATIVE BREAST/OVARIAN/UTERINE CANCER: NEGATIVE COLON CANCER: MGF DECEASED FROM COLON CANCER DEPRESSION: NEGATIVE ETOH/DRUG ABUSE: NEGATIVE OTHER + STROKE PGF  Social History: Last updated: 09/22/2007 Marital Status: Married// LIVES WITH WIFE Children: 1 DAUGHTER Occupation: Curator // [employer]  Risk Factors: Alcohol Use: 0 (07/24/2010) Caffeine Use: 0 (07/24/2010) Exercise: no (07/24/2010)  Risk Factors: Smoking Status: never (07/24/2010) Passive Smoke Exposure: no (07/24/2010)  Family History: Father:ALIVE 85  CABG X 4 12/2001 Chol Htn Mother: ALIVE 53 , HTN   ELEVATED  CHOLESTEROL // REPEAT COLONOSCOPY'S (FH) DIABETIC AT AGE 57 BROTHER A 65 CABG X1; HTN CV: +BROTHER CABG ALIVE; PUNCLE DECEASED MI  M UNCLE --CAD; MUNCLE DECEASED MI HBP: +MOTHER ; +BROTHER DM: +MOTHER AT AGE 59// +M UNCLE GOUT/ARTHRITIS: M UNCLE PROSTATE CANCER: NEGATIVE BREAST/OVARIAN/UTERINE CANCER: NEGATIVE COLON CANCER: MGF DECEASED FROM COLON CANCER DEPRESSION: NEGATIVE ETOH/DRUG ABUSE: NEGATIVE OTHER + STROKE PGF  Social History: Caffeine use/day:  0  Review of Systems General:  Denies chills, fatigue, fever, sweats, weakness, and weight loss. Eyes:  Denies blurring, discharge, and eye pain. ENT:  Complains of ringing in ears; denies decreased hearing and earache. CV:  Complains of palpitations; denies chest pain or discomfort, fainting, fatigue, shortness of breath with exertion, swelling of feet, and swelling of  hands. Resp:  Denies cough, shortness of breath, and wheezing. GI:  Denies abdominal pain, bloody stools, change in bowel habits, constipation, dark tarry stools, diarrhea, indigestion, loss of appetite, nausea, vomiting, vomiting blood, and yellowish skin color. GU:  Denies dysuria, nocturia, and urinary frequency. MS:  Complains of joint pain and low back pain; denies muscle aches, cramps, and stiffness; knee and low back. Derm:  Complains of dryness; denies itching and rash; chronic, left cheek. Neuro:  Complains of numbness; denies poor balance, tingling, and tremors; lat right foot.  Physical Exam  General:  Well-developed,well-nourished,in no acute distress; alert,appropriate and cooperative throughout examination Head:  Normocephalic and atraumatic without obvious abnormalities. No apparent alopecia or balding. Eyes:  Conjunctiva clear bilaterally.  Ears:  External ear exam shows no significant lesions or deformities.  Otoscopic examination reveals clear canals, tympanic membranes are intact bilaterally without bulging, retraction, inflammation or discharge. Hearing is grossly normal bilaterally. Nose:  External nasal examination shows no deformity or inflammation. Nasal mucosa are pink and moist without lesions or exudates. Mouth:  Oral mucosa and oropharynx without lesions or exudates.  Teeth in good repair. Neck:  No deformities, masses, or tenderness  noted. Chest Wall:  No deformities, masses, tenderness or gynecomastia noted. Breasts:  No masses or gynecomastia noted Lungs:  Normal respiratory effort, chest expands symmetrically. Lungs are clear to auscultation, no crackles or wheezes. Heart:  Normal rate and regular rhythm. S1 and S2 normal without gallop, murmur, click, rub or other extra sounds. Abdomen:  Bowel sounds positive,abdomen soft and non-tender without masses, organomegaly or hernias noted. Rectal:  No external abnormalities noted. Normal sphincter tone. No rectal  masses or tenderness. Genitalia:  Testes bilaterally descended without nodularity. R w/o  tenderness or mass.  Left with tenderness of the eipdidymus, tasticle mobile no other masses felt. No scrotal masses or lesions felt . No penis lesions or urethral discharge. Prostate:  Prostate gland firm and smooth, no enlargement, nodularity, tenderness, mass, asymmetry or induration. 20gms. Msk:  no midline cervical tenderness or paraspinous mm tenderness.  FROM at neck.  negative spurling test. Pulses:  R and L carotid,radial,femoral,dorsalis pedis and posterior tibial pulses are full and equal bilaterally Extremities:  No clubbing, cyanosis, edema, or deformity noted with normal full range of motion of all joints.   Neurologic:  No cranial nerve deficits noted. Station and gait are normal. Sensory, motor and coordinative functions appear intact. Skin:  Intact without suspicious lesions or rashes Cervical Nodes:  no anterior cervical adenopathy and no posterior cervical adenopathy.   Inguinal Nodes:  No significant adenopathy Psych:  Cognition and judgment appear intact. Alert and cooperative with normal attention span and concentration. No apparent delusions, illusions, hallucinations, good eye contact.   Impression & Recommendations:  Problem # 1:  HEALTH MAINTENANCE EXAM (ICD-V70.0) Assessment Comment Only  Reviewed preventive care protocols, scheduled due services, and updated immunizations.  Problem # 2:  PERIPHERAL NEUROPATHY, RIGHT LAT FOOT (ICD-355.8) Assessment: Unchanged Stable but continues postoperatively s/p back surgery.  Problem # 3:  LOW BACK PAIN, CHRONIC (ICD-724.2) Assessment: Unchanged  His updated medication list for this problem includes:    Naprosyn 500 Mg Tabs (Naproxen) .[name]... Take one by mouth two times a day with food x 10 days then as needed    Flexeril 10 Mg Tabs (Cyclobenzaprine hcl) .[name]... Take one by mouth two times a day as needed muscle spasm sedation  precautions  Problem # 4:  HYPERGLYCEMIA (ICD-790.29) Assessment: Deteriorated  FBS diabetes range but A1C ok...will follow. Needs to be extra careful with sweets and carbs. FH puts him at risk.  Labs Reviewed: Creat: 1.2 (07/22/2010)     Problem # 5:  BENIGN PROSTATIC HYPERTROPHY (ICD-600.00) Assessment: Unchanged Stable.  Problem # 6:  HYPOTHYROIDISM (ICD-244.9) Assessment: Unchanged Stable and therapeutic. His updated medication list for this problem includes:    Levoxyl 88 Mcg Tabs (Levothyroxine sodium) .[name]... 1 tablet daily by mouth  Labs Reviewed: TSH: 0.63 (07/22/2010)   Free T4: 1.04 (07/22/2010)    HgBA1c: 5.7 (07/22/2010) Chol: 155 (07/22/2010)   HDL: 40.20 (07/22/2010)   LDL: 100 (07/22/2010)   TG: 74.0 (07/22/2010)  Problem # 7:  HYPERLIPIDEMIA (ICD-272.4) Assessment: Improved Good nos, cont curr meds. His updated medication list for this problem includes:    Vytorin 10-20 Mg Tabs (Ezetimibe-simvastatin) .[name]... 1 tablet at bedtime by mouth  Labs Reviewed: SGOT: 32 (07/22/2010)   SGPT: 32 (07/22/2010)   HDL:40.20 (07/22/2010), 37.20 (03/26/2009)  LDL:100 (07/22/2010), 73 (16/06/9603)  Chol:155 (07/22/2010), 130 (03/26/2009)  Trig:74.0 (07/22/2010), 101.0 (03/26/2009)  Problem # 8:  GERD (ICD-530.81) Assessment: Unchanged SWell controlled. His updated medication list for this problem includes:    Protonix 40 Mg  Tbec (Pantoprazole sodium) .[name]... 1 daily by mouth  Problem # 9:  ALLERGIC RHINITIS, CHRONIC SEASONAL AND PERENNIAL (ICD-477.9) Assessment: Unchanged Well controlled. His updated medication list for this problem includes:    Omnaris 50 Mcg/act Susp (Ciclesonide) .[name]... 1 spray each nostril as needed    Clarinex 5 Mg Tabs (Desloratadine) .[name]... 1 daily by mouth as needed  Problem # 10:  PALPITATIONS (ICD-785.1) Assessment: New  EKG prior on caffeine with PVCs. EKG today off caffeine for 3 days.[name]..Nml. Return if sxs recur. Caffeine important for him to  avoid.  Avoid caffeine, chocolate, and stimulants. Stress reduction as well as medication options discussed.   Complete Medication List: 1)  Protonix 40 Mg Tbec (Pantoprazole sodium) .[name].. 1 daily by mouth 2)  Levoxyl 88 Mcg Tabs (Levothyroxine sodium) .[name].. 1 tablet daily by mouth 3)  Vytorin 10-20 Mg Tabs (Ezetimibe-simvastatin) .[name].. 1 tablet at bedtime by mouth 4)  Omnaris 50 Mcg/act Susp (Ciclesonide) .[name].. 1 spray each nostril as needed 5)  Clarinex 5 Mg Tabs (Desloratadine) .[name].. 1 daily by mouth as needed 6)  B Complex-c Caps (B complex-c) .... One a day 7)  Vitamin C 500 Mg Tabs (Ascorbic acid) .... One a day 8)  Naprosyn 500 Mg Tabs (Naproxen) .... Take one by mouth two times a day with food x 10 days then as needed 9)  Flexeril 10 Mg Tabs (Cyclobenzaprine hcl) .... Take one by mouth two times a day as needed muscle spasm sedation precautions 10)  Allergy Shots  .... Take one injection weekly   Orders Added: 1)  Est. Patient 40-64 years [[id number]]    Current Allergies (reviewed today): ! PENICILLIN ! SULFA ! KEFLEX

## 2010-10-01 NOTE — Assessment & Plan Note (Signed)
Summary: allergy shot/[facility]   Nurse Visit    Prior Medications: PROTONIX 40 MG TBEC (PANTOPRAZOLE SODIUM) 1 DAILY BY MOUTH LEVOXYL 88 MCG TABS (LEVOTHYROXINE SODIUM) 1 TABLET DAILY BY MOUTH VYTORIN 10-20 MG TABS (EZETIMIBE-SIMVASTATIN) 1 TABLET AT BEDTIME BY MOUTH ASTELIN 137 MCG/SPRAY  SOLN (AZELASTINE HCL)  OMNARIS 50 MCG/ACT  SUSP (CICLESONIDE)  SINGULAIR 10 MG  TABS (MONTELUKAST SODIUM) 1 DAILY BY MOUTH CLARINEX 5 MG  TABS (DESLORATADINE) 1 DAILY BY MOUTH LYRICA 75 MG  CAPS (PREGABALIN) one tab by mouth tid Current Allergies: ! PENICILLIN ! SULFA ! KEFLEX    Medication Administration  Injection # 1:    Medication: Allergy Injection (1)    Diagnosis: ALLERGIC RHINITIS, CHRONIC SEASONAL AND PERENNIAL (ICD-477.9)    Route: SQ    Site: L deltoid    Exp Date: 09/09/2008    Lot #: mold/ t-g-w    Mfr: [place] allergy    Comments: 0.3 ml subqu mold in right deiltoid 0.3 ml subqu t-g-w- in left deiltoid    Patient tolerated injection without complications    Given by: Burna Mortimer Bolen (March 09, 2008 4:38 PM)  Orders Added: 1)  Admin of Therapeutic Inj  intramuscular or subcutaneous Lepidus.[place]nam    ]  Medication Administration  Injection # 1:    Medication: Allergy Injection (1)    Diagnosis: ALLERGIC RHINITIS, CHRONIC SEASONAL AND PERENNIAL (ICD-477.9)    Route: SQ    Site: L deltoid    Exp Date: 09/09/2008    Lot #: mold/ t-g-w    Mfr: [place] allergy    Comments: 0.3 ml subqu mold in right deiltoid 0.3 ml subqu t-g-w- in left deiltoid    Patient tolerated injection without complications    Given by: Burna Mortimer Mapps (March 09, 2008 4:38 PM)  Orders Added: 1)  Admin of Therapeutic Inj  intramuscular or subcutaneous [[id number]]

## 2010-10-01 NOTE — Assessment & Plan Note (Signed)
Summary: ALLERGY SHOTS/[facility]  Nurse Visit    Prior Medications: PROTONIX 40 MG TBEC (PANTOPRAZOLE SODIUM) 1 qd LEVOXYL 88 MCG TABS (LEVOTHYROXINE SODIUM) 1 qd VYTORIN 10-20 MG TABS (EZETIMIBE-SIMVASTATIN) 1 q hs ASTELIN 137 MCG/SPRAY  SOLN (AZELASTINE HCL)  OMNARIS 50 MCG/ACT  SUSP (CICLESONIDE)  SINGULAIR 10 MG  TABS (MONTELUKAST SODIUM)  CLARINEX 5 MG  TABS (DESLORATADINE)  Current Allergies: ! PENICILLIN ! SULFA ! KEFLEX    Medication Administration  Injection # 1:    Medication: Allergy Injection (1)    Diagnosis: ALLERGIC RHINITIS, CHRONIC SEASONAL AND PERENNIAL (ICD-477.9)    Route: SQ    Site: R deltoid    Exp Date: 09/09/2008    Lot #: mold//t-g-w-    Mfr: [place]    Comments: .    Patient tolerated injection without complications    Given by: Burna Mortimer Alles (December 22, 2007 5:22 PM)  Orders Added: 1)  Allergy Injection (1) [95115] 2)  Admin of Therapeutic Inj  intramuscular or subcutaneous Lepidus.[place]nam    ]  Medication Administration  Injection # 1:    Medication: Allergy Injection (1)    Diagnosis: ALLERGIC RHINITIS, CHRONIC SEASONAL AND PERENNIAL (ICD-477.9)    Route: SQ    Site: R deltoid    Exp Date: 09/09/2008    Lot #: mold//t-g-w-    Mfr: [place]    Comments: .    Patient tolerated injection without complications    Given by: Burna Mortimer [place] (December 22, 2007 5:22 PM)  Orders Added: 1)  Allergy Injection (1) [95115] 2)  Admin of Therapeutic Inj  intramuscular or subcutaneous [[id number]]

## 2010-10-01 NOTE — Assessment & Plan Note (Signed)
Summary: allergy shots/[facility]  Nurse Visit    Prior Medications: PROTONIX 40 MG TBEC (PANTOPRAZOLE SODIUM) 1 qd LEVOXYL 88 MCG TABS (LEVOTHYROXINE SODIUM) 1 qd VYTORIN 10-20 MG TABS (EZETIMIBE-SIMVASTATIN) 1 q hs ASTELIN 137 MCG/SPRAY  SOLN (AZELASTINE HCL)  OMNARIS 50 MCG/ACT  SUSP (CICLESONIDE)  SINGULAIR 10 MG  TABS (MONTELUKAST SODIUM)  CLARINEX 5 MG  TABS (DESLORATADINE)  Current Allergies: ! PENICILLIN ! SULFA ! KEFLEX    Medication Administration  Injection # 1:    Medication: Allergy Injection (1)    Diagnosis: ALLERGIC RHINITIS, CHRONIC SEASONAL AND PERENNIAL (ICD-477.9)    Route: SQ    Site: R deltoid    Exp Date: 09/09/2008    Lot #: mold/t-g-w-    Mfr: [facility]    Comments: 0.3 ml/s in armbrought own medication    Patient tolerated injection without complications    Given by: Burna Mortimer [place]it[place] (December 08, 2007 9:33 AM)  Orders Added: 1)  Admin of Therapeutic Inj  intramuscular or subcutaneous Lepidus.[place]    ]  Medication Administration  Injection # 1:    Medication: Allergy Injection (1)    Diagnosis: ALLERGIC RHINITIS, CHRONIC SEASONAL AND PERENNIAL (ICD-477.9)    Route: SQ    Site: R deltoid    Exp Date: 09/09/2008    Lot #: mold/t-g-w-    Mfr: [facility]    Comments: 0.3 ml/s in armbrought own medication    Patient tolerated injection without complications    Given by: Burna Mortimer Rounds (December 08, 2007 9:33 AM)  Orders Added: 1)  Admin of Therapeutic Inj  intramuscular or subcutaneous [[id number]]

## 2010-10-01 NOTE — Assessment & Plan Note (Signed)
Summary: allergy shot/[facility]   Nurse Visit    Prior Medications: PROTONIX 40 MG TBEC (PANTOPRAZOLE SODIUM) 1 DAILY BY MOUTH LEVOXYL 88 MCG TABS (LEVOTHYROXINE SODIUM) 1 TABLET DAILY BY MOUTH VYTORIN 10-20 MG TABS (EZETIMIBE-SIMVASTATIN) 1 TABLET AT BEDTIME BY MOUTH ASTELIN 137 MCG/SPRAY  SOLN (AZELASTINE HCL)  OMNARIS 50 MCG/ACT  SUSP (CICLESONIDE)  SINGULAIR 10 MG  TABS (MONTELUKAST SODIUM) 1 DAILY BY MOUTH CLARINEX 5 MG  TABS (DESLORATADINE) 1 DAILY BY MOUTH LYRICA 75 MG  CAPS (PREGABALIN) one tab by mouth tid Current Allergies: ! PENICILLIN ! SULFA ! KEFLEX    Medication Administration  Injection # 1:    Medication: Allergy Injection (1)    Diagnosis: ALLERGIC RHINITIS, CHRONIC SEASONAL AND PERENNIAL (ICD-477.9)    Route: SQ    Site: L deltoid    Exp Date: 08/08/2009    Lot #: TGW//MOLD    Mfr: [place]    Patient tolerated injection without complications    Given by: Burna Mortimer [place] (September 27, 2008 4:07 PM)  Orders Added: 1)  Admin of Therapeutic Inj  intramuscular or subcutaneous Lepidus.[place]    ]  Medication Administration  Injection # 1:    Medication: Allergy Injection (1)    Diagnosis: ALLERGIC RHINITIS, CHRONIC SEASONAL AND PERENNIAL (ICD-477.9)    Route: SQ    Site: L deltoid    Exp Date: 08/08/2009    Lot #: TGW//MOLD    Mfr: [place]    Patient tolerated injection without complications    Given by: Burna Mortimer Ben (September 27, 2008 4:07 PM)  Orders Added: 1)  Admin of Therapeutic Inj  intramuscular or subcutaneous [[id number]]

## 2010-10-01 NOTE — Letter (Signed)
Summary: Dr.John [facility],Note  Dr.John Wrenn,[facility],Note   Imported By: Beau Fanny 02/08/2010 14:26:04  _____________________________________________________________________  External Attachment:    Type:   Image     Comment:   External Document

## 2010-10-01 NOTE — Assessment & Plan Note (Signed)
Summary: ALLERGY SHOTS/[facility]   Nurse Visit    Prior Medications: PROTONIX 40 MG TBEC (PANTOPRAZOLE SODIUM) 1 DAILY BY MOUTH LEVOXYL 88 MCG TABS (LEVOTHYROXINE SODIUM) 1 TABLET DAILY BY MOUTH VYTORIN 10-20 MG TABS (EZETIMIBE-SIMVASTATIN) 1 TABLET AT BEDTIME BY MOUTH ASTELIN 137 MCG/SPRAY  SOLN (AZELASTINE HCL)  OMNARIS 50 MCG/ACT  SUSP (CICLESONIDE)  SINGULAIR 10 MG  TABS (MONTELUKAST SODIUM) 1 DAILY BY MOUTH CLARINEX 5 MG  TABS (DESLORATADINE) 1 DAILY BY MOUTH LYRICA 75 MG  CAPS (PREGABALIN) one tab by mouth tid Current Allergies: ! PENICILLIN ! SULFA ! KEFLEX    Medication Administration  Injection # 1:    Medication: Allergy Injection (1)    Diagnosis: ALLERGIC RHINITIS, CHRONIC SEASONAL AND PERENNIAL (ICD-477.9)    Route: SQ    Site: R deltoid    Exp Date: 02/27/2009    Lot #: t-g-w// mold    Mfr: [place] allergy    Comments: 0.82mls in deltoid    Patient tolerated injection without complications    Given by: Burna Mortimer Self (May 24, 2008 3:32 PM)  Orders Added: 1)  Admin of Therapeutic Inj  intramuscular or subcutaneous Lepidus.[place]    ]  Medication Administration  Injection # 1:    Medication: Allergy Injection (1)    Diagnosis: ALLERGIC RHINITIS, CHRONIC SEASONAL AND PERENNIAL (ICD-477.9)    Route: SQ    Site: R deltoid    Exp Date: 02/27/2009    Lot #: t-g-w// mold    Mfr:  allergy    Comments: 0.57mls in deltoid    Patient tolerated injection without complications    Given by: Burna Mortimer [place] (May 24, 2008 3:32 PM)  Orders Added: 1)  Admin of Therapeutic Inj  intramuscular or subcutaneous [[id number]]

## 2010-10-09 NOTE — Letter (Signed)
Summary: Bjorn Pippin MD/[facility]   Bjorn Pippin MD/[facility]   Imported By: Lester Ellsworth 09/30/2010 10:55:59  _____________________________________________________________________  External Attachment:    Type:   Image     Comment:   External Document

## 2011-01-01 ENCOUNTER — Other Ambulatory Visit: Payer: Self-pay | Admitting: *Deleted

## 2011-01-01 MED ORDER — PANTOPRAZOLE SODIUM 40 MG PO TBEC: 40.0000 mg | DELAYED_RELEASE_TABLET | Freq: Every day | ORAL | Status: DC

## 2011-01-17 NOTE — Op Note (Signed)
[place]  Patient:    Isaac Guerrero, Isaac Guerrero                         MRN: [id number] Proc. Date: 08/21/00 Adm. Date:  [id number] Attending:  Gerald Dexter                           Operative Report  PREOPERATIVE DIAGNOSIS:  Herniated disk at L5-S1 right.  POSTOPERATIVE DIAGNOSIS:  Herniated disk at L5-S1 right.  PROCEDURES: 1. Right L5-S1 interlaminar laminotomy for excision of herniated disk with the    operating microscope. 2. Microdissection L5-S1 disk and S1 nerve root.  SURGEON:  Reinaldo Meeker, M.D.  ASSISTANT:  Donzetta Sprung. Roney Jaffe., M.D.  DESCRIPTION OF PROCEDURE:  After being placed in a prone position, the patients back was prepped and draped in the usual sterile fashion.  A localizing x-ray was taken prior to incision to identify the L5-S1 level.  A midline incision was made above the spinous processes of L5 and S1.  Using the Bovie cutting current, the incision was carried down to the spinous processes. Subperiosteal dissection was then carried out on the right side of the spinous processes and laminae.  A McCullough self-retaining retractor was placed for exposure.  A second x-ray was taken to confirm approach to the L5-S1 level, and this was correct.  Using the high-speed drill, the inferior one-half of the L5 lamina and the medial one-third of the facet joint were removed.  The drill was then used to remove the superior one-third of the S1 lamina. Residual bone and ligamentum flavum were removed in a piecemeal fashion.  The microscope was draped and brought into the field and used for the remainder of the case.  Using microdissection technique, the lateral aspect of the thecal sac and S1 nerve were identified.  Bipolar coagulation was carried out down to the floor of the canal to identify the L5-S1 disk.  Just superior to the disk space was a large free fragment of disk material, and this was removed without difficulty.  The disk  space was then incised with a 15 blade.  Using pituitary rongeurs and curettes, a very thorough disk space cleanout was carried out, and at the same time great care was taken to avoid injury to the neural elements.  This was successfully done.  At this point, inspection was carried out for any evidence of residual compression, and none could be identified. Large amounts of irrigation were carried out and any bleeding controlled with bipolar coagulation and Gelfoam.  It was then closed using interrupted Vicryl in the muscle and fascia and subcutaneous and subcuticular tissue and staples on the skin.  Sterile dressing was then applied.  The patient was extubated and taken to the recovery room in stable condition. DD:  08/21/00 TD:  08/22/00 Job: [id number]

## 2011-01-25 ENCOUNTER — Telehealth: Payer: Self-pay | Admitting: *Deleted

## 2011-01-25 NOTE — Telephone Encounter (Signed)
Patient is currently taking vytorin 10/20// Insurance wants to change to a generic simvastatin and ezetimibe.  Patient would do what ever you think is best. But it would help lower cost since wife won't work.

## 2011-01-27 MED ORDER — SIMVASTATIN 40 MG PO TABS: 40.0000 mg | ORAL_TABLET | Freq: Every evening | ORAL | Status: DC

## 2011-01-27 NOTE — Telephone Encounter (Signed)
Have pt start taking Simva 40. SGOT, SGPT 272.4    6 WEEKS See me in 3 mos, SGOT, SGPT, CHOL PROFILE  272.4     prior Schedule PE 12/12 with labs prior.

## 2011-01-28 NOTE — Telephone Encounter (Signed)
Patient's wife Dondra Spry) notified as instructed by telephone. Was advised that she will have patient to call back and schedule his appointments as instructed. Medication sent to pharmacy.

## 2011-03-10 ENCOUNTER — Other Ambulatory Visit: Payer: Self-pay | Admitting: Family Medicine

## 2011-03-10 DIAGNOSIS — E7800 Pure hypercholesterolemia, unspecified: Secondary | ICD-10-CM

## 2011-03-11 ENCOUNTER — Other Ambulatory Visit (INDEPENDENT_AMBULATORY_CARE_PROVIDER_SITE_OTHER): Payer: BC Managed Care – PPO | Admitting: Family Medicine

## 2011-03-11 DIAGNOSIS — E7800 Pure hypercholesterolemia, unspecified: Secondary | ICD-10-CM

## 2011-03-11 LAB — AST: AST: 32 U/L (ref 0–37)

## 2011-03-11 LAB — ALT: ALT: 33 U/L (ref 0–53)

## 2011-04-30 ENCOUNTER — Encounter: Payer: Self-pay | Admitting: Family Medicine

## 2011-05-06 ENCOUNTER — Other Ambulatory Visit (INDEPENDENT_AMBULATORY_CARE_PROVIDER_SITE_OTHER): Payer: BC Managed Care – PPO

## 2011-05-06 DIAGNOSIS — E7800 Pure hypercholesterolemia, unspecified: Secondary | ICD-10-CM

## 2011-05-06 LAB — LIPID PANEL
Cholesterol: 136 mg/dL (ref 0–200)
HDL: 43.5 mg/dL (ref >39.00)
LDL Cholesterol: 74 mg/dL (ref 0–99)
Total CHOL/HDL Ratio: 3
Triglycerides: 92 mg/dL (ref 0.0–149.0)
VLDL: 18.4 mg/dL (ref 0.0–40.0)

## 2011-05-06 LAB — AST: AST: 30 U/L (ref 0–37)

## 2011-05-06 LAB — ALT: ALT: 28 U/L (ref 0–53)

## 2011-05-14 ENCOUNTER — Ambulatory Visit (INDEPENDENT_AMBULATORY_CARE_PROVIDER_SITE_OTHER): Payer: BC Managed Care – PPO | Admitting: Family Medicine

## 2011-05-14 ENCOUNTER — Encounter: Payer: Self-pay | Admitting: Family Medicine

## 2011-05-14 DIAGNOSIS — E785 Hyperlipidemia, unspecified: Secondary | ICD-10-CM

## 2011-05-14 DIAGNOSIS — M25569 Pain in unspecified knee: Secondary | ICD-10-CM | POA: Insufficient documentation

## 2011-05-14 NOTE — Assessment & Plan Note (Signed)
Suggest he try Glucosamine/Chondroitin/MSM, Fish Oil and Hyaluronic Acid for his knees. Will take as long as 6 mos to see improvement.

## 2011-05-14 NOTE — Patient Instructions (Signed)
Keep appt in Dec for PE.

## 2011-05-14 NOTE — Progress Notes (Signed)
  Subjective:    Patient ID: Isaac Guerrero, male    DOB: 18-Apr-1956, 55 y.o.   MRN: [id number]  HPI Pt here for followup of chol. Is now on Simva 40 while taking Co Q 10 and tolerating them well. We drew labs a few days ago.  He has knee pain but otherwise feels well and has no other complaints.    Review of Systems  Constitutional: Negative for fever, chills, diaphoresis, activity change, appetite change and fatigue.  HENT: Negative for hearing loss, ear pain, congestion, sore throat, rhinorrhea, neck pain, neck stiffness, postnasal drip, sinus pressure, tinnitus and ear discharge.   Eyes: Negative for pain, discharge and visual disturbance.  Respiratory: Negative for cough, shortness of breath and wheezing.   Cardiovascular: Negative for chest pain and palpitations.       No SOB w/ exertion  Gastrointestinal:       No heartburn or swallowing problems.  Genitourinary:       No nocturia  Skin:       No itching or dryness.  Neurological:       No tingling or balance problems.  All other systems reviewed and are negative.       Objective:   Physical Exam  Constitutional: He appears well-developed and well-nourished. No distress.  HENT:  Head: Normocephalic and atraumatic.  Right Ear: External ear normal.  Left Ear: External ear normal.  Nose: Nose normal.  Mouth/Throat: Oropharynx is clear and moist.  Eyes: Conjunctivae and EOM are normal. Pupils are equal, round, and reactive to light. Right eye exhibits no discharge. Left eye exhibits no discharge.  Neck: Normal range of motion. Neck supple.  Cardiovascular: Normal rate and regular rhythm.   Pulmonary/Chest: Effort normal and breath sounds normal. He has no wheezes.  Lymphadenopathy:    He has no cervical adenopathy.  Skin: He is not diaphoretic.          Assessment & Plan:

## 2011-05-14 NOTE — Assessment & Plan Note (Signed)
Good response. Now at goal. Cont Simva and cont Co Q 10. Will recheck at PE. Now at goal. Lab Results  Component Value Date   CHOL 136 05/06/2011   CHOL 155 07/22/2010   CHOL 130 03/26/2009   Lab Results  Component Value Date   HDL 43.50 05/06/2011   HDL 40.20 07/22/2010   HDL 37.20* 03/26/2009   Lab Results  Component Value Date   LDLCALC 74 05/06/2011   LDLCALC 100* 07/22/2010   LDLCALC 73 03/26/2009   Lab Results  Component Value Date   TRIG 92.0 05/06/2011   TRIG 74.0 07/22/2010   TRIG 101.0 03/26/2009   Lab Results  Component Value Date   CHOLHDL 3 05/06/2011   CHOLHDL 4 07/22/2010   CHOLHDL 3 03/26/2009   No results found for this basename: LDLDIRECT

## 2011-07-30 ENCOUNTER — Other Ambulatory Visit: Payer: Self-pay | Admitting: Family Medicine

## 2011-07-30 DIAGNOSIS — E039 Hypothyroidism, unspecified: Secondary | ICD-10-CM

## 2011-07-30 DIAGNOSIS — N400 Enlarged prostate without lower urinary tract symptoms: Secondary | ICD-10-CM

## 2011-07-30 DIAGNOSIS — E785 Hyperlipidemia, unspecified: Secondary | ICD-10-CM

## 2011-07-30 DIAGNOSIS — R002 Palpitations: Secondary | ICD-10-CM

## 2011-07-30 DIAGNOSIS — R7309 Other abnormal glucose: Secondary | ICD-10-CM

## 2011-08-05 ENCOUNTER — Other Ambulatory Visit (INDEPENDENT_AMBULATORY_CARE_PROVIDER_SITE_OTHER): Payer: BC Managed Care – PPO

## 2011-08-05 DIAGNOSIS — E039 Hypothyroidism, unspecified: Secondary | ICD-10-CM

## 2011-08-05 DIAGNOSIS — R002 Palpitations: Secondary | ICD-10-CM

## 2011-08-05 DIAGNOSIS — E785 Hyperlipidemia, unspecified: Secondary | ICD-10-CM

## 2011-08-05 DIAGNOSIS — N400 Enlarged prostate without lower urinary tract symptoms: Secondary | ICD-10-CM

## 2011-08-05 DIAGNOSIS — R7309 Other abnormal glucose: Secondary | ICD-10-CM

## 2011-08-05 LAB — CBC WITH DIFFERENTIAL/PLATELET
Basophils Absolute: 0 10*3/uL (ref 0.0–0.1)
Basophils Relative: 0.5 % (ref 0.0–3.0)
Eosinophils Absolute: 0.3 10*3/uL (ref 0.0–0.7)
Eosinophils Relative: 5.6 % — ABNORMAL HIGH (ref 0.0–5.0)
HCT: 48 % (ref 39.0–52.0)
Hemoglobin: 16.5 g/dL (ref 13.0–17.0)
Lymphocytes Relative: 30.5 % (ref 12.0–46.0)
Lymphs Abs: 1.8 10*3/uL (ref 0.7–4.0)
MCHC: 34.4 g/dL (ref 30.0–36.0)
MCV: 88.1 fl (ref 78.0–100.0)
Monocytes Absolute: 0.4 10*3/uL (ref 0.1–1.0)
Monocytes Relative: 6.8 % (ref 3.0–12.0)
Neutro Abs: 3.4 10*3/uL (ref 1.4–7.7)
Neutrophils Relative %: 56.6 % (ref 43.0–77.0)
Platelets: 163 10*3/uL (ref 150.0–400.0)
RBC: 5.44 Mil/uL (ref 4.22–5.81)
RDW: 12.6 % (ref 11.5–14.6)
WBC: 6 10*3/uL (ref 4.5–10.5)

## 2011-08-05 LAB — HEPATIC FUNCTION PANEL
ALT: 40 U/L (ref 0–53)
AST: 37 U/L (ref 0–37)
Albumin: 4.2 g/dL (ref 3.5–5.2)
Alkaline Phosphatase: 84 U/L (ref 39–117)
Bilirubin, Direct: 0.2 mg/dL (ref 0.0–0.3)
Total Bilirubin: 1 mg/dL (ref 0.3–1.2)
Total Protein: 7.1 g/dL (ref 6.0–8.3)

## 2011-08-05 LAB — RENAL FUNCTION PANEL
Albumin: 4.2 g/dL (ref 3.5–5.2)
BUN: 19 mg/dL (ref 6–23)
CO2: 27 mEq/L (ref 19–32)
Calcium: 9.3 mg/dL (ref 8.4–10.5)
Chloride: 105 mEq/L (ref 96–112)
Creatinine, Ser: 1.2 mg/dL (ref 0.4–1.5)
GFR: 68.11 mL/min (ref >60.00)
Glucose, Bld: 118 mg/dL — ABNORMAL HIGH (ref 70–99)
Phosphorus: 3.6 mg/dL (ref 2.3–4.6)
Potassium: 4.6 mEq/L (ref 3.5–5.1)
Sodium: 141 mEq/L (ref 135–145)

## 2011-08-05 LAB — LIPID PANEL
Cholesterol: 144 mg/dL (ref 0–200)
HDL: 39.5 mg/dL (ref >39.00)
LDL Cholesterol: 80 mg/dL (ref 0–99)
Total CHOL/HDL Ratio: 4
Triglycerides: 123 mg/dL (ref 0.0–149.0)
VLDL: 24.6 mg/dL (ref 0.0–40.0)

## 2011-08-05 LAB — MICROALBUMIN / CREATININE URINE RATIO
Creatinine,U: 229.9 mg/dL
Microalb Creat Ratio: 0.3 mg/g (ref 0.0–30.0)
Microalb, Ur: 0.7 mg/dL (ref 0.0–1.9)

## 2011-08-06 LAB — T4, FREE: Free T4: 0.94 ng/dL (ref 0.60–1.60)

## 2011-08-06 LAB — TSH: TSH: 0.7 u[IU]/mL (ref 0.35–5.50)

## 2011-08-06 LAB — PSA: PSA: 0.98 ng/mL (ref 0.10–4.00)

## 2011-08-13 ENCOUNTER — Encounter: Payer: Self-pay | Admitting: Family Medicine

## 2011-08-13 ENCOUNTER — Ambulatory Visit (INDEPENDENT_AMBULATORY_CARE_PROVIDER_SITE_OTHER): Payer: BC Managed Care – PPO | Admitting: Family Medicine

## 2011-08-13 VITALS — BP 118/78 | HR 72 | Temp 98.1°F | Ht 69.0 in | Wt 187.8 lb

## 2011-08-13 DIAGNOSIS — R7309 Other abnormal glucose: Secondary | ICD-10-CM

## 2011-08-13 DIAGNOSIS — N400 Enlarged prostate without lower urinary tract symptoms: Secondary | ICD-10-CM

## 2011-08-13 DIAGNOSIS — E785 Hyperlipidemia, unspecified: Secondary | ICD-10-CM

## 2011-08-13 DIAGNOSIS — Z23 Encounter for immunization: Secondary | ICD-10-CM

## 2011-08-13 DIAGNOSIS — K219 Gastro-esophageal reflux disease without esophagitis: Secondary | ICD-10-CM

## 2011-08-13 DIAGNOSIS — E039 Hypothyroidism, unspecified: Secondary | ICD-10-CM

## 2011-08-13 DIAGNOSIS — R002 Palpitations: Secondary | ICD-10-CM

## 2011-08-13 DIAGNOSIS — M25569 Pain in unspecified knee: Secondary | ICD-10-CM

## 2011-08-13 NOTE — Patient Instructions (Signed)
Will start with new practice in [place]. Call for records.

## 2011-08-13 NOTE — Assessment & Plan Note (Signed)
Stable altho has mild sxs at times.

## 2011-08-13 NOTE — Assessment & Plan Note (Signed)
Coninues with mild sugar elevation. Discussed again trying to decrease sweets and carbs. Will also help combat CAD by decreasing inflammation of elevated sugar.

## 2011-08-13 NOTE — Progress Notes (Signed)
  Subjective:    Patient ID: Isaac Guerrero, male    DOB: [birth year], 55 y.o.   MRN: [id number]  HPI Pt here for Comp Exam. He has some congestion today which has been going on since yesterday. His wife has had it. She got over it ok. Grandson and son in law has had it also.  He continues to tolerate his chol medication well.     Review of Systems  Constitutional: Negative for fever, chills, diaphoresis, appetite change, fatigue and unexpected weight change.  HENT: Positive for tinnitus (chronic). Negative for hearing loss, ear pain and ear discharge.   Eyes: Negative for pain, discharge and visual disturbance.  Respiratory: Negative for cough, shortness of breath and wheezing.   Cardiovascular: Negative for chest pain and palpitations.       No SOB w/ exertion Has fluttering occas, worse at night.   Gastrointestinal: Negative for nausea, vomiting, abdominal pain, diarrhea, constipation and blood in stool.       No heartburn or swallowing problems.  Genitourinary: Negative for dysuria, frequency and difficulty urinating.       No nocturia  Musculoskeletal: Negative for myalgias, back pain and arthralgias.       Knees doing well, on no supplements.  Skin: Negative for rash.       No itching but typical winter dryness.  Neurological: Negative for tremors and numbness.       No tingling or balance problems.  Hematological: Negative for adenopathy. Does not bruise/bleed easily.  Psychiatric/Behavioral: Negative for dysphoric mood and agitation.       Objective:   Physical Exam  Constitutional: He is oriented to person, place, and time. He appears well-developed and well-nourished. No distress.  HENT:  Head: Normocephalic and atraumatic.  Right Ear: External ear normal.  Left Ear: External ear normal.  Nose: Nose normal.  Mouth/Throat: Oropharynx is clear and moist.  Eyes: Conjunctivae and EOM are normal. Pupils are equal, round, and reactive to light. Right eye exhibits no discharge.  Left eye exhibits no discharge. No scleral icterus.  Neck: Normal range of motion. Neck supple. No thyromegaly present.  Cardiovascular: Normal rate, regular rhythm, normal heart sounds and intact distal pulses.   No murmur heard. Pulmonary/Chest: Effort normal and breath sounds normal. No respiratory distress. He has no wheezes.  Abdominal: Soft. Bowel sounds are normal. He exhibits no distension and no mass. There is no tenderness. There is no rebound and no guarding.  Genitourinary: Penis normal.       No hernias. Rectal not done.  Musculoskeletal: Normal range of motion. He exhibits no edema.  Lymphadenopathy:    He has no cervical adenopathy.  Neurological: He is alert and oriented to person, place, and time. Coordination normal.  Skin: Skin is warm and dry. No rash noted. He is not diaphoretic.  Psychiatric: He has a normal mood and affect. His behavior is normal. Judgment and thought content normal.          Assessment & Plan:  HMPE

## 2011-08-13 NOTE — Assessment & Plan Note (Signed)
Euthyroid on current replacement dose. Cont. Lab Results  Component Value Date   TSH 0.70 08/05/2011

## 2011-08-13 NOTE — Assessment & Plan Note (Addendum)
Improved. May take Glucosamine/Chondroitin, Hyaluronic Acid and Fish Oil any time which will help over the long haul.

## 2011-08-13 NOTE — Assessment & Plan Note (Signed)
Stable

## 2011-08-13 NOTE — Assessment & Plan Note (Signed)
Disc ussed poss of starting beta blocker but his sxs do not seem bad enough to justify another medication to the list at this point. Can always add one in the future.

## 2011-08-13 NOTE — Assessment & Plan Note (Signed)
Good nos on curr dose of medication. Cont. Lab Results  Component Value Date   CHOL 144 08/05/2011   HDL 39.50 08/05/2011   LDLCALC 80 08/05/2011   TRIG 123.0 08/05/2011   CHOLHDL 4 08/05/2011

## 2011-11-21 ENCOUNTER — Encounter: Payer: Self-pay | Admitting: Gastroenterology

## 2012-02-13 ENCOUNTER — Other Ambulatory Visit: Payer: Self-pay | Admitting: Family Medicine

## 2012-06-18 ENCOUNTER — Other Ambulatory Visit: Payer: Self-pay | Admitting: Family Medicine

## 2013-05-26 ENCOUNTER — Encounter: Payer: Self-pay | Admitting: Cardiology

## 2013-05-26 ENCOUNTER — Ambulatory Visit (INDEPENDENT_AMBULATORY_CARE_PROVIDER_SITE_OTHER): Payer: BC Managed Care – PPO | Admitting: Cardiology

## 2013-05-26 VITALS — BP 139/79 | HR 84 | Ht 70.0 in | Wt 192.2 lb

## 2013-05-26 DIAGNOSIS — R079 Chest pain, unspecified: Secondary | ICD-10-CM

## 2013-05-26 DIAGNOSIS — R011 Cardiac murmur, unspecified: Secondary | ICD-10-CM

## 2013-05-26 DIAGNOSIS — R002 Palpitations: Secondary | ICD-10-CM

## 2013-05-26 NOTE — Patient Instructions (Addendum)
Your physician recommends that you schedule a follow-up appointment in: 3 weeks with Dr. Wyline Mood. You will schedule this today before you leave.  Your physician recommends that you continue on your current medications as directed. Please refer to the Current Medication list given to you today.  Your physician has requested that you have an echocardiogram. Echocardiography is a painless test that uses sound waves to create images of your heart. It provides your doctor with information about the size and shape of your heart and how well your heart's chambers and valves are working. This procedure takes approximately one hour. There are no restrictions for this procedure.  Your physician has requested that you have a stress echocardiogram. For further information please visit [email or web address] Please follow instruction sheet as given.

## 2013-05-26 NOTE — Progress Notes (Signed)
Clinical Summary Isaac Guerrero is a 57 y.o.male referred by Dr Margo Aye for consultation for dyspnea on exertoin  1. Dyspnea on exertion - symptoms for 1 year. Only occurs w/ exertion. Notices w/ treadmill at 7-10 minutes. Often has symptoms walking up hill or hauling wood. Reports previously being able to do these activities. Feels lightheaded. Denies pain in chest, but can have some left arm numbness. - no orthopnea, no PND, no LE edema - CAD risk factors: brother CABG 29s, father CABG 68s, paternal uncle heart troubles, maternal uncle heart troubles. No tobacco, + HL, no HTN    Past Medical History  Diagnosis Date  . GERD (gastroesophageal reflux disease) 11/29/1998  . Hyperlipidemia 08/1997  . Hypothyroidism 1983  . BPH (benign prostatic hyperplasia) 08/1989  . Spermatocele     per Dr. Annabell Howells with [facility]     Allergies  Allergen Reactions  . Cephalexin     REACTION: rash  . Penicillins     REACTION: rash  . Sulfonamide Derivatives     REACTION: rash     Current Outpatient Prescriptions  Medication Sig Dispense Refill  . b complex vitamins tablet Take 1 tablet by mouth daily.        . Cholecalciferol (VITAMIN D3) 2000 UNITS TABS Take by mouth daily.        . ciclesonide (OMNARIS) 50 MCG/ACT nasal spray Place 1 spray into both nostrils as needed.        . Coenzyme Q10 200 MG capsule Take 200 mg by mouth daily.        [name] levothyroxine (LEVOXYL) 88 MCG tablet Take 88 mcg by mouth daily.        . naproxen (NAPROSYN) 500 MG tablet Take 500 mg by mouth 2 (two) times daily as needed.        . pantoprazole (PROTONIX) 40 MG tablet Take 1 tablet (40 mg total) by mouth daily.  30 tablet  6  . vitamin C (ASCORBIC ACID) 500 MG tablet Take 500 mg by mouth daily.        [name] ZOCOR 40 MG tablet TAKE ONE TABLET BY MOUTH IN THE EVENING.  30 each  4   No current facility-administered medications for this visit.     Past Surgical History  Procedure Laterality Date  . Gallbladder ultrasound   04/1987    normal  . Cystoscopy  08/1989    normal  . Esophagogastroduodenoscopy  10/1999    distal esoph. stricture; sliding H. H.  . Lumbar microdisectomy  08/2000    Dr. Gerlene Fee  . Cataract extraction  03/2003    left eye  . Lasik  04/12/2003    right eye     Allergies  Allergen Reactions  . Cephalexin     REACTION: rash  . Penicillins     REACTION: rash  . Sulfonamide Derivatives     REACTION: rash      Family History  Problem Relation Age of Onset  . Hypertension Mother   . Hyperlipidemia Mother   . Diabetes Mother 87  . Heart disease Father     CABG X 4  12/2001  . Hyperlipidemia Father   . Hypertension Father   . Heart disease Brother     CABG X 1  . Diabetes Maternal Uncle   . Arthritis Maternal Uncle   . Heart disease Paternal Uncle     MI  . Cancer Maternal Grandfather     colon  . Stroke Paternal Grandfather  Social History Isaac Guerrero reports that he has never smoked. He has never used smokeless tobacco. Isaac Guerrero reports that he does not drink alcohol.   Review of Systems Complete ROS negative other than reported in HPI  Physical Examination p 84 bp 139/79 Wt 192 lbs BMI 27 Gen: resting comfortably, NAD HEENT: no scleral icterus, pupils equal round and reactive, no palptable cervical adenopathy CV: RRR, 3/6 systolic murmur RUSB, no JVD, no carotid bruits Pulm: CTAB Abd: soft, NT, ND NABS, no hepatosplenomegaly Ext: warm, no edema.  Skin: warm, no rash Neuro: A&Ox3, no focal deficits    Diagnostic Studies 05/25/13 Clinic EKG: SR, LAD, possible LAFB, LAE, non-spec ST/T changes    Assessment and Plan  1. Dyspnea on exertion - dyspnea during activities patient was previously able to tolerate, notes non-specific numbness down left arm in this setting. Does have some CAD risk factors. - obtain stress echocardiogram, patient does not have normal EKG at baseline, needs imaging to assist w/ diagnosis  2. Heart murmur - murmur  suggestive of aortic stenosis, could be contributing to dyspnea - check 2D echocardiogram   Antoine Poche, M.D., F.A.C.C.

## 2013-06-01 ENCOUNTER — Encounter: Payer: Self-pay | Admitting: Gastroenterology

## 2013-06-13 ENCOUNTER — Ambulatory Visit (HOSPITAL_COMMUNITY)
Admission: RE | Admit: 2013-06-13 | Discharge: 2013-06-13 | Disposition: A | Discharge status: Home / Self Care | Payer: BC Managed Care – PPO | Source: Ambulatory Visit | Attending: Cardiology | Admitting: Cardiology

## 2013-06-13 ENCOUNTER — Encounter (HOSPITAL_COMMUNITY): Payer: Self-pay

## 2013-06-13 DIAGNOSIS — R0602 Shortness of breath: Secondary | ICD-10-CM

## 2013-06-13 DIAGNOSIS — Z8249 Family history of ischemic heart disease and other diseases of the circulatory system: Secondary | ICD-10-CM | POA: Insufficient documentation

## 2013-06-13 DIAGNOSIS — R079 Chest pain, unspecified: Secondary | ICD-10-CM

## 2013-06-13 DIAGNOSIS — I519 Heart disease, unspecified: Secondary | ICD-10-CM

## 2013-06-13 DIAGNOSIS — R0989 Other specified symptoms and signs involving the circulatory and respiratory systems: Secondary | ICD-10-CM | POA: Insufficient documentation

## 2013-06-13 DIAGNOSIS — R0609 Other forms of dyspnea: Secondary | ICD-10-CM | POA: Insufficient documentation

## 2013-06-13 DIAGNOSIS — R002 Palpitations: Secondary | ICD-10-CM | POA: Insufficient documentation

## 2013-06-13 DIAGNOSIS — R011 Cardiac murmur, unspecified: Secondary | ICD-10-CM | POA: Insufficient documentation

## 2013-06-13 NOTE — Progress Notes (Signed)
*  PRELIMINARY RESULTS* Echocardiogram Echocardiogram Stress Test has been performed.  Om Lizotte 06/13/2013, 11:52 AM

## 2013-06-13 NOTE — Progress Notes (Signed)
[facility]   2D echo completed 06/13/2013.   Veda Canning, RDCS

## 2013-06-13 NOTE — Progress Notes (Signed)
Stress Lab Nurses Notes - [facility] 06/13/2013 Reason for doing test: Chest Pain and palpitations Type of test: Stress Echo Nurse performing test: Parke Poisson, RN Nuclear Medicine Tech: Not Applicable Echo Tech: Veda Canning MD performing test: Diona Browner / Joni Reining NP Family MD: Dr. Margo Aye Test explained and consent signed: yes IV started: No IV started Symptoms: None Treatment/Intervention: None Reason test stopped: fatigue and reached target HR After recovery IV was: NA Patient to return to Nuc. Med at :NA Patient discharged: Home Patient's Condition upon discharge was: stable Comments: During test peak BP 176/84 & HR 160 .  Recovery BP 131/73 & HR 95 .  Symptoms resolved in recovery. Erskine Speed T

## 2013-06-17 ENCOUNTER — Encounter: Payer: Self-pay | Admitting: Cardiology

## 2013-06-17 ENCOUNTER — Ambulatory Visit (INDEPENDENT_AMBULATORY_CARE_PROVIDER_SITE_OTHER): Payer: BC Managed Care – PPO | Admitting: Cardiology

## 2013-06-17 VITALS — BP 153/91 | HR 81 | Ht 70.0 in | Wt 185.2 lb

## 2013-06-17 DIAGNOSIS — R0602 Shortness of breath: Secondary | ICD-10-CM

## 2013-06-17 NOTE — Progress Notes (Signed)
Clinical Summary Isaac Guerrero is a 57 y.o.male  Isaac Guerrero is a 57 y.o.male referred by Dr Isaac Guerrero for consultation for dyspnea on exertoin   1. Dyspnea on exertion  - symptoms for 1 year. Only occurs w/ exertion. Notices w/ treadmill at 7-10 minutes. Often has symptoms walking up hill or hauling wood. Reports previously being able to do these activities. Feels lightheaded. Denies pain in chest, but can have some left arm numbness.  - no orthopnea, no PND, no LE edema  - CAD risk factors: brother CABG 44s, father CABG 39s, paternal uncle heart troubles, maternal uncle heart troubles. No tobacco, + HL, no HTN  - reports long history of second hand smoke exposure. Parents smoked growing up, wife smoked at home for 28 years  Past Medical History  Diagnosis Date  . GERD (gastroesophageal reflux disease) 11/29/1998  . Hyperlipidemia 08/1997  . Hypothyroidism 1983  . BPH (benign prostatic hyperplasia) 08/1989  . Spermatocele     per Isaac Guerrero with [facility]     Allergies  Allergen Reactions  . Cephalexin     REACTION: rash  . Penicillins     REACTION: rash  . Sulfonamide Derivatives     REACTION: rash     Current Outpatient Prescriptions  Medication Sig Dispense Refill  . ANDROGEL PUMP 20.25 MG/ACT (1.62%) GEL       . aspirin 81 MG tablet Take 81 mg by mouth daily.      [name] b complex vitamins tablet Take 1 tablet by mouth daily.        . Cholecalciferol (VITAMIN D3) 2000 UNITS TABS Take by mouth daily.        . ciclesonide (OMNARIS) 50 MCG/ACT nasal spray Place 1 spray into both nostrils as needed.        . Coenzyme Q10 200 MG capsule Take 200 mg by mouth daily.        [name] levothyroxine (LEVOXYL) 88 MCG tablet Take 88 mcg by mouth daily.        . naproxen (NAPROSYN) 500 MG tablet Take 500 mg by mouth 2 (two) times daily as needed.        . pantoprazole (PROTONIX) 40 MG tablet Take 1 tablet (40 mg total) by mouth daily.  30 tablet  6  . vitamin C (ASCORBIC ACID) 500 MG tablet Take 500 mg  by mouth daily.        [name] ZOCOR 40 MG tablet TAKE ONE TABLET BY MOUTH IN THE EVENING.  30 each  4   No current facility-administered medications for this visit.     Past Surgical History  Procedure Laterality Date  . Gallbladder ultrasound  04/1987    normal  . Cystoscopy  08/1989    normal  . Esophagogastroduodenoscopy  10/1999    distal esoph. stricture; sliding H. H.  . Lumbar microdisectomy  08/2000    Isaac Guerrero  . Cataract extraction  03/2003    left eye  . Lasik  04/12/2003    right eye     Allergies  Allergen Reactions  . Cephalexin     REACTION: rash  . Penicillins     REACTION: rash  . Sulfonamide Derivatives     REACTION: rash      Family History  Problem Relation Age of Onset  . Hypertension Mother   . Hyperlipidemia Mother   . Diabetes Mother 69  . Heart disease Father     CABG X 4  12/2001  .  Hyperlipidemia Father   . Hypertension Father   . Heart disease Brother     CABG X 1  . Diabetes Maternal Uncle   . Arthritis Maternal Uncle   . Heart disease Paternal Uncle     MI  . Cancer Maternal Grandfather     colon  . Stroke Paternal Grandfather      Social History Isaac Guerrero reports that he has never smoked. He has never used smokeless tobacco. Isaac Guerrero reports that he does not drink alcohol.   Review of Systems CONSTITUTIONAL: No weight loss, fever, chills, weakness or fatigue.  HEENT: Eyes: No visual loss, blurred vision, double vision or yellow sclerae.No hearing loss, sneezing, congestion, runny nose or sore throat.  SKIN: No rash or itching.  CARDIOVASCULAR: per HPI RESPIRATORY: per HPI GASTROINTESTINAL: No anorexia, nausea, vomiting or diarrhea. No abdominal pain or blood.  GENITOURINARY: No burning on urination, no polyuria NEUROLOGICAL: No headache, dizziness, syncope, paralysis, ataxia, numbness or tingling in the extremities. No change in bowel or bladder control.  MUSCULOSKELETAL: No muscle, back pain, joint pain or  stiffness.  LYMPHATICS: No enlarged nodes. No history of splenectomy.  PSYCHIATRIC: No history of depression or anxiety.  ENDOCRINOLOGIC: No reports of sweating, cold or heat intolerance. No polyuria or polydipsia.  [name]   Physical Examination p 81 bp 153/91 Wt 185 lbs BMI 27 Gen: resting comfortably, no acute distress HEENT: no scleral icterus, pupils equal round and reactive, no palptable cervical adenopathy,  CV: RRR, 2/6 sysotlic murmur RUSB, no JVD, no carotid bruits Resp: Clear to auscultation bilaterally GI: abdomen is soft, non-tender, non-distended, normal bowel sounds, no hepatosplenomegaly MSK: extremities are warm, no edema.  Skin: warm, no rash Neuro:  no focal deficits Psych: appropriate affect   Diagnostic Studies 06/13/13 Echo: LVEF 60-65%, normal wall motion, grade I diastolic dysfunction, aortic sclerosis without stenosis  06/13/13 Stress echo: Negative EKG, excercised 10 min, Duke treadmill score 10, negative echo, above average functional capacity  Assessment and Plan   1. Dyspnea on exertion  - dyspnea during activities patient was previously able to tolerate, notes non-specific numbness down left arm in this setting. Does have some CAD risk factors.  - fairly normal echo, only mild diastolic dysfunction.  - stress echo with very good exercise capacity, no ischemia by EKG, and low Duke risk score - overall no clear identifiable cardiac cause of his subjective symptoms. Objectively he has very good exercise tolerance.  - long history of second hand smoke exposure, will order PFTs - do not recommend any further cardiac testing at this time, may return for follow up if new symptoms devlop  2. 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

## 2013-06-17 NOTE — Patient Instructions (Signed)
Your physician recommends that you schedule a follow-up appointment in: AS NEEDED  Your physician has recommended that you have a pulmonary function test. Pulmonary Function Tests are a group of tests that measure how well air moves in and out of your lungs.  WE WILL CALL YOU WITH YOUR TEST RESULTS/INSTRUCTIONS/NEXT STEPS ONCE RECEIVED BY THE PROVIDER

## 2013-06-21 ENCOUNTER — Ambulatory Visit: Payer: BC Managed Care – PPO | Admitting: Gastroenterology

## 2013-07-04 ENCOUNTER — Ambulatory Visit (HOSPITAL_COMMUNITY)
Admission: RE | Admit: 2013-07-04 | Discharge: 2013-07-04 | Disposition: A | Discharge status: Home / Self Care | Payer: BC Managed Care – PPO | Source: Ambulatory Visit | Attending: Cardiology | Admitting: Cardiology

## 2013-07-04 DIAGNOSIS — R0602 Shortness of breath: Secondary | ICD-10-CM | POA: Insufficient documentation

## 2013-07-04 MED ORDER — ALBUTEROL SULFATE (5 MG/ML) 0.5% IN NEBU
2.5000 mg | INHALATION_SOLUTION | Freq: Once | RESPIRATORY_TRACT | Status: AC
  Administered 2013-07-04: 2.5 mg via RESPIRATORY_TRACT

## 2013-07-06 ENCOUNTER — Telehealth: Payer: Self-pay

## 2013-07-06 NOTE — Procedures (Signed)
NAMEAARYN, SERMON                  ACCOUNT NO.:  [id number]  MEDICAL RECORD NO.:  [id number]  LOCATION:  RESP                          FACILITY:  [facility]  PHYSICIAN:  Wendy Hoback L. Juanetta Gosling, M.D.DATE OF BIRTH:  01-08-1956  DATE OF PROCEDURE: DATE OF DISCHARGE:  07/04/2013                           PULMONARY FUNCTION TEST   REASON FOR PULMONARY FUNCTION TESTING:  Shortness of breath. 1. Spirometry does not show a ventilatory defect, but does show     minimal airflow obstruction. 2. Lung volumes are normal. 3. DLCO is mildly reduced, but improves when ventilation is taken into     account. 4. Airway resistance is normal. 5. There is no significant bronchodilator improvement. 6. No definite cause of shortness of breath is seen on this pulmonary     function test.     Ramon Dredge L. Juanetta Gosling, M.D.     ELH/MEDQ  D:  07/05/2013  T:  07/06/2013  Job:  [id number]

## 2013-07-06 NOTE — Telephone Encounter (Signed)
Pt was referred again from Dr. Dwana Melena for drop in blood count and iron deficient. ( He had cancelled a previous appt on 06/21/2013 with Tana Coast, PA).   I called and LMOM for a return call.

## 2013-07-07 NOTE — Telephone Encounter (Signed)
Pt came by the office today, and is scheduled for OV with Gerrit Halls, NP on 07/20/2013 at 11:00 AM.  He said the reason he cancelled the previous appt, he was started on iron and then rechecked his blood count and was told that it has not changed much.

## 2013-07-12 LAB — PULMONARY FUNCTION TEST

## 2013-07-13 ENCOUNTER — Telehealth: Payer: Self-pay | Admitting: *Deleted

## 2013-07-13 NOTE — Telephone Encounter (Signed)
Left message for pt to call back  °

## 2013-07-13 NOTE — Telephone Encounter (Signed)
Message copied by Thompson Grayer on Wed Jul 13, 2013  5:15 PM ------      Message from: [place] F      Created: Wed Jul 13, 2013  4:50 PM       Please let patient know there are some mild abnormalities in his lung tests, and that we recommend he see a lung doctor to review the results in detail and consider possible further testing or treatment. Please offer a refer to Dr Juanetta Gosling in town or if he prefers to [place] for [employer] [facility] pulmonary                  Dina Rich MD       ------

## 2013-07-20 ENCOUNTER — Ambulatory Visit: Payer: BC Managed Care – PPO | Admitting: Gastroenterology

## 2013-08-08 ENCOUNTER — Ambulatory Visit (HOSPITAL_COMMUNITY)
Admission: RE | Admit: 2013-08-08 | Discharge: 2013-08-08 | Disposition: A | Discharge status: Home / Self Care | Payer: BC Managed Care – PPO | Source: Ambulatory Visit | Attending: Pulmonary Disease | Admitting: Pulmonary Disease

## 2013-08-08 ENCOUNTER — Other Ambulatory Visit (HOSPITAL_COMMUNITY): Payer: Self-pay | Admitting: Pulmonary Disease

## 2013-08-08 DIAGNOSIS — R0602 Shortness of breath: Secondary | ICD-10-CM

## 2013-08-11 ENCOUNTER — Other Ambulatory Visit (HOSPITAL_COMMUNITY): Payer: Self-pay | Admitting: Pulmonary Disease

## 2013-08-11 DIAGNOSIS — R0602 Shortness of breath: Secondary | ICD-10-CM

## 2013-08-11 DIAGNOSIS — J449 Chronic obstructive pulmonary disease, unspecified: Secondary | ICD-10-CM

## 2013-08-15 ENCOUNTER — Ambulatory Visit: Payer: BC Managed Care – PPO | Admitting: Gastroenterology

## 2013-08-15 ENCOUNTER — Encounter (HOSPITAL_COMMUNITY): Payer: Self-pay

## 2013-08-15 ENCOUNTER — Ambulatory Visit (HOSPITAL_COMMUNITY)
Admission: RE | Admit: 2013-08-15 | Discharge: 2013-08-15 | Disposition: A | Discharge status: Home / Self Care | Payer: BC Managed Care – PPO | Source: Ambulatory Visit | Attending: Pulmonary Disease | Admitting: Pulmonary Disease

## 2013-08-15 DIAGNOSIS — R599 Enlarged lymph nodes, unspecified: Secondary | ICD-10-CM | POA: Insufficient documentation

## 2013-08-15 DIAGNOSIS — R0602 Shortness of breath: Secondary | ICD-10-CM

## 2013-08-15 DIAGNOSIS — J4489 Other specified chronic obstructive pulmonary disease: Secondary | ICD-10-CM | POA: Insufficient documentation

## 2013-08-15 DIAGNOSIS — R911 Solitary pulmonary nodule: Secondary | ICD-10-CM | POA: Insufficient documentation

## 2013-08-15 DIAGNOSIS — I2584 Coronary atherosclerosis due to calcified coronary lesion: Secondary | ICD-10-CM | POA: Insufficient documentation

## 2013-08-15 DIAGNOSIS — J449 Chronic obstructive pulmonary disease, unspecified: Secondary | ICD-10-CM

## 2013-08-19 ENCOUNTER — Other Ambulatory Visit (HOSPITAL_COMMUNITY): Payer: Self-pay | Admitting: Pulmonary Disease

## 2013-08-19 DIAGNOSIS — R0600 Dyspnea, unspecified: Secondary | ICD-10-CM

## 2013-08-30 ENCOUNTER — Encounter (INDEPENDENT_AMBULATORY_CARE_PROVIDER_SITE_OTHER): Payer: Self-pay

## 2013-08-30 ENCOUNTER — Ambulatory Visit (HOSPITAL_COMMUNITY): Payer: BC Managed Care – PPO | Attending: Pulmonary Disease

## 2013-08-30 DIAGNOSIS — R0600 Dyspnea, unspecified: Secondary | ICD-10-CM

## 2013-08-30 DIAGNOSIS — R0609 Other forms of dyspnea: Secondary | ICD-10-CM | POA: Insufficient documentation

## 2013-08-30 DIAGNOSIS — R0989 Other specified symptoms and signs involving the circulatory and respiratory systems: Secondary | ICD-10-CM | POA: Insufficient documentation

## 2013-08-31 DIAGNOSIS — R0989 Other specified symptoms and signs involving the circulatory and respiratory systems: Secondary | ICD-10-CM

## 2013-08-31 DIAGNOSIS — R0609 Other forms of dyspnea: Secondary | ICD-10-CM

## 2013-09-12 ENCOUNTER — Ambulatory Visit: Payer: BC Managed Care – PPO | Admitting: Gastroenterology

## 2013-10-06 ENCOUNTER — Ambulatory Visit (INDEPENDENT_AMBULATORY_CARE_PROVIDER_SITE_OTHER): Payer: BC Managed Care – PPO | Admitting: Gastroenterology

## 2013-10-06 ENCOUNTER — Encounter: Payer: Self-pay | Admitting: Gastroenterology

## 2013-10-06 VITALS — BP 146/85 | HR 82 | Temp 97.6°F | Wt 191.8 lb

## 2013-10-06 DIAGNOSIS — D649 Anemia, unspecified: Secondary | ICD-10-CM

## 2013-10-06 LAB — CBC
HCT: 43.1 % (ref 39.0–52.0)
Hemoglobin: 14.9 g/dL (ref 13.0–17.0)
MCH: 26.3 pg (ref 26.0–34.0)
MCHC: 34.6 g/dL (ref 30.0–36.0)
MCV: 76 fL — ABNORMAL LOW (ref 78.0–100.0)
Platelets: 171 10*3/uL (ref 150–400)
RBC: 5.67 MIL/uL (ref 4.22–5.81)
RDW: 17.7 % — ABNORMAL HIGH (ref 11.5–15.5)
WBC: 5.7 10*3/uL (ref 4.0–10.5)

## 2013-10-06 NOTE — Patient Instructions (Signed)
Please have blood work done. We will call with the results.  Due to history of iron deficiency, we recommend further GI evaluation. We will be in touch with results of blood work!

## 2013-10-06 NOTE — Progress Notes (Signed)
    Referring Provider: Hall, Zach, MD Primary Care Physician:  HALL, ZACH, MD Primary Gastroenterologist:  Dr. Rourk   Chief Complaint  Patient presents with  . Colonoscopy    HPI:   Isaac Guerrero presents today at the request of Dr. Hall due to anemia and updated colonoscopy Last colonoscopy in Dec 2010 by [facility] in [place]. Polypectomy at that time per patient. We have requested records. No bright red blood, melena. No abdominal pain. No constipation, diarrhea, changes in bowel habits. Protonix for GERD. No dysphagia.   Outside labs from Oct 2014 reviewed with Hgb low normal at 13.3. Iron low at 32. No ferritin on file.   Past Medical History  Diagnosis Date  . GERD (gastroesophageal reflux disease) 11/29/1998  . Hyperlipidemia 08/1997  . Hypothyroidism 1983  . BPH (benign prostatic hyperplasia) 08/1989  . Spermatocele     per Dr. Wrenn with [facility]    Past Surgical History  Procedure Laterality Date  . Cystoscopy  08/1989    normal  . Esophagogastroduodenoscopy  10/1999    distal esoph. stricture; sliding H. H.  . Lumbar microdisectomy  08/2000    Dr. Kritzer  . Cataract extraction  03/2003    left eye  . Lasik  04/12/2003    right eye  . Colonoscopy  08/10/09    polyp in ascending colon otherwise normal    Current Outpatient Prescriptions  Medication Sig Dispense Refill  . ANDROGEL PUMP 20.25 MG/ACT (1.62%) GEL       . aspirin 81 MG tablet Take 81 mg by mouth daily.      . b complex vitamins tablet Take 1 tablet by mouth daily.        . Cholecalciferol (VITAMIN D3) 2000 UNITS TABS Take by mouth daily.        . ciclesonide (OMNARIS) 50 MCG/ACT nasal spray Place 1 spray into both nostrils as needed.        . Coenzyme Q10 200 MG capsule Take 200 mg by mouth daily.        . levothyroxine (LEVOXYL) 88 MCG tablet Take 88 mcg by mouth daily.        . Multiple Vitamin (MULTIVITAMIN) capsule Take 1 capsule by mouth daily.      . pantoprazole  (PROTONIX) 40 MG tablet Take 40 mg by mouth daily.      . vitamin C (ASCORBIC ACID) 500 MG tablet Take 500 mg by mouth daily.        . ZOCOR 40 MG tablet TAKE ONE TABLET BY MOUTH IN THE EVENING.  30 each  4   No current facility-administered medications for this visit.    Allergies as of 10/06/2013 - Review Complete 10/06/2013  Allergen Reaction Noted  . Cephalexin  01/15/2007  . Penicillins  01/15/2007  . Sulfonamide derivatives  01/15/2007    Family History  Problem Relation Age of Onset  . Hypertension Mother   . Hyperlipidemia Mother   . Diabetes Mother 81  . Heart disease Father     CABG X 4  12/2001  . Hyperlipidemia Father   . Hypertension Father   . Heart disease Brother     CABG X 1  . Diabetes Maternal Uncle   . Arthritis Maternal Uncle   . Heart disease Paternal Uncle     MI  . Cancer Maternal Grandfather     colon  . Stroke Paternal Grandfather     History   Social History  .   Marital Status: Married    Spouse Name: N/A    Number of Children: 1  . Years of Education: N/A   Occupational History  . Mechanic     [employer]   Social History Main Topics  . Smoking status: Never Smoker   . Smokeless tobacco: Never Used  . Alcohol Use: No  . Drug Use: No  . Sexual Activity: Yes   Other Topics Concern  . Not on file   Social History Narrative   Married and lives with wife    Review of Systems: Gen: Denies any fever, chills, loss of appetite, fatigue, weight loss. CV: Denies chest pain, heart palpitations, syncope, peripheral edema. Resp: +DOE GI: see HPI GU : Denies urinary burning, urinary frequency, urinary incontinence.  MS: Denies joint pain, muscle weakness, cramps, limited movement Derm: Denies rash, itching, dry skin Psych: Denies depression, anxiety, confusion or memory loss  Heme: Denies bruising, bleeding, and enlarged lymph nodes.  Physical Exam: BP 146/85  Pulse 82  Temp(Src) 97.6 F (36.4 C) (Oral)  Wt 191 lb 12.8 oz  (87 kg) General:   Alert and oriented. Well-developed, well-nourished, pleasant and cooperative. Head:  Normocephalic and atraumatic. Eyes:  Conjunctiva pink, sclera clear, no icterus.   Conjunctiva pink. Ears:  Normal auditory acuity. Nose:  No deformity, discharge,  or lesions. Mouth:  No deformity or lesions, mucosa pink and moist.  Neck:  Supple, without mass or thyromegaly. Lungs:  Clear to auscultation bilaterally, without wheezing, rales, or rhonchi.  Heart:  S1, S2 present without murmurs noted.  Abdomen:  +BS, soft, non-tender and non-distended. Without mass or HSM. No rebound or guarding. No hernias noted. Rectal:  Deferred  Msk:  Symmetrical without gross deformities. Normal posture. Extremities:  Without clubbing or edema. Neurologic:  Alert and  oriented x4;  grossly normal neurologically. Skin:  Intact, warm and dry without significant lesions or rashes Cervical Nodes:  No significant cervical adenopathy. Psych:  Alert and cooperative. Normal mood and affect.

## 2013-10-07 LAB — FERRITIN: Ferritin: 11 ng/mL — ABNORMAL LOW (ref 22–322)

## 2013-10-07 LAB — IRON: Iron: 50 ug/dL (ref 42–165)

## 2013-10-10 DIAGNOSIS — D649 Anemia, unspecified: Secondary | ICD-10-CM | POA: Insufficient documentation

## 2013-10-10 NOTE — Assessment & Plan Note (Signed)
58 year old male with reportedly newfound anemia in Oct 2014, with low iron and low normal Hgb noted. No ferritin on file. Last labs Oct 2014. No overt signs of GI bleeding or concerning symptoms. Last colonoscopy in 2010 at outside facility, with records requested. He is hesitant to pursue an evaluation at this time due to insurance purposes. I have discussed the importance of surveillance colonoscopy with EGD due to new onset anemia. He would rather have updated labs and consider evaluation after review of blood work.   Obtain updated CBC, iron, and ferritin.  Recommend TCS/EGD with Dr. Gala Romney in the near future. He would like to wait on labs first. Risks and benefits discussed in detail with patient at time of appt.

## 2013-10-11 NOTE — Progress Notes (Signed)
cc'd to pcp 

## 2013-10-11 NOTE — Progress Notes (Signed)
Quick Note:  Hgb normal. Iron normal at 50 but ferritin significantly low at 11. Strongly recommend TCS/EGD with Dr. Gala Romney for further evaluation. At time of appt, he was hesitant to pursue this. ______

## 2013-10-13 LAB — CBC
HCT: 40 %
HGB: 13.3 g/dL
Iron: 32

## 2013-10-17 ENCOUNTER — Other Ambulatory Visit: Payer: Self-pay | Admitting: Internal Medicine

## 2013-10-17 ENCOUNTER — Encounter (HOSPITAL_COMMUNITY): Payer: Self-pay | Admitting: Pharmacy Technician

## 2013-10-17 DIAGNOSIS — D649 Anemia, unspecified: Secondary | ICD-10-CM

## 2013-10-17 MED ORDER — PEG 3350-KCL-NA BICARB-NACL 420 G PO SOLR: 4000.0000 mL | ORAL | Status: DC

## 2013-10-17 NOTE — Progress Notes (Signed)
Quick Note:    Noted    ______

## 2013-10-28 ENCOUNTER — Encounter (HOSPITAL_COMMUNITY)
Admission: RE | Disposition: A | Discharge status: Home / Self Care | Payer: Self-pay | Source: Ambulatory Visit | Attending: Internal Medicine

## 2013-10-28 ENCOUNTER — Encounter (HOSPITAL_COMMUNITY): Payer: Self-pay

## 2013-10-28 ENCOUNTER — Ambulatory Visit (HOSPITAL_COMMUNITY)
Admission: RE | Admit: 2013-10-28 | Discharge: 2013-10-28 | Disposition: A | Discharge status: Home / Self Care | Payer: BC Managed Care – PPO | Source: Ambulatory Visit | Attending: Internal Medicine | Admitting: Internal Medicine

## 2013-10-28 DIAGNOSIS — D649 Anemia, unspecified: Secondary | ICD-10-CM

## 2013-10-28 DIAGNOSIS — K222 Esophageal obstruction: Secondary | ICD-10-CM | POA: Insufficient documentation

## 2013-10-28 DIAGNOSIS — K219 Gastro-esophageal reflux disease without esophagitis: Secondary | ICD-10-CM

## 2013-10-28 DIAGNOSIS — Z7982 Long term (current) use of aspirin: Secondary | ICD-10-CM | POA: Insufficient documentation

## 2013-10-28 DIAGNOSIS — E785 Hyperlipidemia, unspecified: Secondary | ICD-10-CM | POA: Insufficient documentation

## 2013-10-28 DIAGNOSIS — D509 Iron deficiency anemia, unspecified: Secondary | ICD-10-CM | POA: Insufficient documentation

## 2013-10-28 DIAGNOSIS — K449 Diaphragmatic hernia without obstruction or gangrene: Secondary | ICD-10-CM | POA: Insufficient documentation

## 2013-10-28 DIAGNOSIS — K5730 Diverticulosis of large intestine without perforation or abscess without bleeding: Secondary | ICD-10-CM | POA: Insufficient documentation

## 2013-10-28 HISTORY — PX: COLONOSCOPY WITH ESOPHAGOGASTRODUODENOSCOPY (EGD): SHX5779

## 2013-10-28 SURGERY — COLONOSCOPY WITH ESOPHAGOGASTRODUODENOSCOPY (EGD)
Anesthesia: Moderate Sedation

## 2013-10-28 MED ORDER — ONDANSETRON HCL 4 MG/2ML IJ SOLN
INTRAMUSCULAR | Status: AC
  Filled 2013-10-28: qty 2

## 2013-10-28 MED ORDER — MIDAZOLAM HCL 5 MG/5ML IJ SOLN
INTRAMUSCULAR | Status: DC | PRN
  Administered 2013-10-28 (×2): 2 mg via INTRAVENOUS
  Administered 2013-10-28 (×2): 1 mg via INTRAVENOUS

## 2013-10-28 MED ORDER — LIDOCAINE VISCOUS 2 % MT SOLN
OROMUCOSAL | Status: AC
  Filled 2013-10-28: qty 15

## 2013-10-28 MED ORDER — SODIUM CHLORIDE 0.9 % IV SOLN
INTRAVENOUS | Status: DC
  Administered 2013-10-28: 12:00:00 via INTRAVENOUS

## 2013-10-28 MED ORDER — MEPERIDINE HCL 100 MG/ML IJ SOLN
INTRAMUSCULAR | Status: DC | PRN
  Administered 2013-10-28: 50 mg via INTRAVENOUS
  Administered 2013-10-28 (×2): 25 mg via INTRAVENOUS

## 2013-10-28 MED ORDER — MEPERIDINE HCL 100 MG/ML IJ SOLN
INTRAMUSCULAR | Status: AC
  Filled 2013-10-28: qty 2

## 2013-10-28 MED ORDER — ONDANSETRON HCL 4 MG/2ML IJ SOLN
INTRAMUSCULAR | Status: DC | PRN
  Administered 2013-10-28: 4 mg via INTRAVENOUS

## 2013-10-28 MED ORDER — LIDOCAINE VISCOUS 2 % MT SOLN
OROMUCOSAL | Status: DC | PRN
  Administered 2013-10-28: 4 mL via OROMUCOSAL

## 2013-10-28 MED ORDER — STERILE WATER FOR IRRIGATION IR SOLN
Status: DC | PRN
  Administered 2013-10-28: 13:00:00

## 2013-10-28 MED ORDER — MIDAZOLAM HCL 5 MG/5ML IJ SOLN
INTRAMUSCULAR | Status: AC
  Filled 2013-10-28: qty 10

## 2013-10-28 NOTE — Discharge Instructions (Addendum)
Colonoscopy Discharge Instructions  Read the instructions outlined below and refer to this sheet in the next few weeks. These discharge instructions provide you with general information on caring for yourself after you leave the hospital. Your doctor may also give you specific instructions. While your treatment has been planned according to the most current medical practices available, unavoidable complications occasionally occur. If you have any problems or questions after discharge, call Dr. Gala Romney at [phone]. ACTIVITY  You may resume your regular activity, but move at a slower pace for the next 24 hours.   Take frequent rest periods for the next 24 hours.   Walking will help get rid of the air and reduce the bloated feeling in your belly (abdomen).   No driving for 24 hours (because of the medicine (anesthesia) used during the test).    Do not sign any important legal documents or operate any machinery for 24 hours (because of the anesthesia used during the test).  NUTRITION  Drink plenty of fluids.   You may resume your normal diet as instructed by your doctor.   Begin with a light meal and progress to your normal diet. Heavy or fried foods are harder to digest and may make you feel sick to your stomach (nauseated).   Avoid alcoholic beverages for 24 hours or as instructed.  MEDICATIONS  You may resume your normal medications unless your doctor tells you otherwise.  WHAT YOU CAN EXPECT TODAY  Some feelings of bloating in the abdomen.   Passage of more gas than usual.   Spotting of blood in your stool or on the toilet paper.  IF YOU HAD POLYPS REMOVED DURING THE COLONOSCOPY:  No aspirin products for 7 days or as instructed.   No alcohol for 7 days or as instructed.   Eat a soft diet for the next 24 hours.  FINDING OUT THE RESULTS OF YOUR TEST Not all test results are available during your visit. If your test results are not back during the visit, make an appointment  with your caregiver to find out the results. Do not assume everything is normal if you have not heard from your caregiver or the medical facility. It is important for you to follow up on all of your test results.  SEEK IMMEDIATE MEDICAL ATTENTION IF:  You have more than a spotting of blood in your stool.   Your belly is swollen (abdominal distention).   You are nauseated or vomiting.   You have a temperature over 101.  You have abdominal pain or discomfort that is severe or gets worse throughout the day. EGD Discharge instructions Please read the instructions outlined below and refer to this sheet in the next few weeks. These discharge instructions provide you with general information on caring for yourself after you leave the hospital. Your doctor may also give you specific instructions. While your treatment has been planned according to the most current medical practices available, unavoidable complications occasionally occur. If you have any problems or questions after discharge, please call your doctor. ACTIVITY You may resume your regular activity but move at a slower pace for the next 24 hours.  Take frequent rest periods for the next 24 hours.  Walking will help expel (get rid of) the air and reduce the bloated feeling in your abdomen.  No driving for 24 hours (because of the anesthesia (medicine) used during the test).  You may shower.  Do not sign any important legal documents or operate any machinery for 24  hours (because of the anesthesia used during the test).  NUTRITION Drink plenty of fluids.  You may resume your normal diet.  Begin with a light meal and progress to your normal diet.  Avoid alcoholic beverages for 24 hours or as instructed by your caregiver.  MEDICATIONS You may resume your normal medications unless your caregiver tells you otherwise.  WHAT YOU CAN EXPECT TODAY You may experience abdominal discomfort such as a feeling of fullness or gas pains.   FOLLOW-UP Your doctor will discuss the results of your test with you.  SEEK IMMEDIATE MEDICAL ATTENTION IF ANY OF THE FOLLOWING OCCUR: Excessive nausea (feeling sick to your stomach) and/or vomiting.  Severe abdominal pain and distention (swelling).  Trouble swallowing.  Temperature over 101 F (37.8 C).  Rectal bleeding or vomiting of blood.   Continue Protonix 40 mg daily for GERD  Diverticulosis information provided  Further recommendations to follow pending review of pathology report    Diverticulosis Diverticulosis is a common condition that develops when small pouches (diverticula) form in the wall of the colon. The risk of diverticulosis increases with age. It happens more often in people who eat a low-fiber diet. Most individuals with diverticulosis have no symptoms. Those individuals with symptoms usually experience abdominal pain, constipation, or loose stools (diarrhea). HOME CARE INSTRUCTIONS   Increase the amount of fiber in your diet as directed by your caregiver or dietician. This may reduce symptoms of diverticulosis.  Your caregiver may recommend taking a dietary fiber supplement.  Drink at least 6 to 8 glasses of water each day to prevent constipation.  Try not to strain when you have a bowel movement.  Your caregiver may recommend avoiding nuts and seeds to prevent complications, although this is still an uncertain benefit.  Only take over-the-counter or prescription medicines for pain, discomfort, or fever as directed by your caregiver. FOODS WITH HIGH FIBER CONTENT INCLUDE:  Fruits. Apple, peach, pear, tangerine, raisins, prunes.  Vegetables. Brussels sprouts, asparagus, broccoli, cabbage, carrot, cauliflower, romaine lettuce, spinach, summer squash, tomato, winter squash, zucchini.  Starchy Vegetables. Baked beans, kidney beans, lima beans, split peas, lentils, potatoes (with skin).  Grains. Whole wheat bread, brown rice, bran flake cereal, plain  oatmeal, white rice, shredded wheat, bran muffins. SEEK IMMEDIATE MEDICAL CARE IF:   You develop increasing pain or severe bloating.  You have an oral temperature above 102 F (38.9 C), not controlled by medicine.  You develop vomiting or bowel movements that are bloody or black. Document Released: 05/15/2004 Document Revised: 11/10/2011 Document Reviewed: 01/16/2010 [facility] Patient Information 2014 [place].

## 2013-10-28 NOTE — Op Note (Signed)
[facility] [address] [place], [address]   COLONOSCOPY PROCEDURE REPORT  PATIENT: Izen, Petz  MR#:         [id number] BIRTHDATE: 1955-10-03 , 58  yrs. old GENDER: Male ENDOSCOPIST: R.  Garfield Cornea, MD FACP FACG REFERRED BY:  Delphina Cahill, M.D. PROCEDURE DATE:  10/28/2013 PROCEDURE:     Diagnostic ileocolonoscopy  INDICATIONS: Iron deficiency  INFORMED CONSENT:  The risks, benefits, alternatives and imponderables including but not limited to bleeding, perforation as well as the possibility of a missed lesion have been reviewed.  The potential for biopsy, lesion removal, etc. have also been discussed.  Questions have been answered.  All parties agreeable. Please see the history and physical in the medical record for more information.  MEDICATIONS: Versed 6 mg IV and Demerol 100 mg in divided doses. Zofran 4 mg IV  DESCRIPTION OF PROCEDURE:  After a digital rectal exam was performed, the EC-[id number] ([id number])  colonoscope was advanced from the anus through the rectum and colon to the area of the cecum, ileocecal valve and appendiceal orifice.  The cecum was deeply intubated.  These structures were well-seen and photographed for the record.  From the level of the cecum and ileocecal valve, the scope was slowly and cautiously withdrawn.  The mucosal surfaces were carefully surveyed utilizing scope tip deflection to facilitate fold flattening as needed.  The scope was pulled down into the rectum where a thorough examination including retroflexion was performed.    FINDINGS:  Adequate preparation. Normal rectum. Scattered left-sided diverticula; the remainder of the colonic mucosa appeared normal. Distal 10 cm of terminal ileal mucosa also appeared normal.  THERAPEUTIC / DIAGNOSTIC MANEUVERS PERFORMED:  None  COMPLICATIONS: None  CECAL WITHDRAWAL TIME:  14 minutes  IMPRESSION:  Colonic diverticulosis.  RECOMMENDATIONS: Repeat colonoscopy in 10 years  for screening purposes   _______________________________ eSigned:  R. Garfield Cornea, MD FACP [facility] 10/28/2013 1:57 PM   CC:

## 2013-10-28 NOTE — Op Note (Signed)
[facility] [address] [place], [address]   ENDOSCOPY PROCEDURE REPORT  PATIENT: Isaac Guerrero, Isaac Guerrero  MR#: [id number] BIRTHDATE: [birth year] , 58  yrs. old GENDER: Male ENDOSCOPIST: R.  Garfield Cornea, MD FACP FACG REFERRED BY:  Delphina Cahill, M.D. PROCEDURE DATE:  10/28/2013 PROCEDURE:     EGD with gastric and duodenal biopsy  INDICATIONS:      Iron deficiency; long-standing GERD  INFORMED CONSENT:   The risks, benefits, limitations, alternatives and imponderables have been discussed.  The potential for biopsy, esophogeal dilation, etc. have also been reviewed.  Questions have been answered.  All parties agreeable.  Please see the history and physical in the medical record for more information.  MEDICATIONS:  Versed 5 mg IV and Demerol 75 mg IV in divided doses. Xylocaine gel and Zofran 4 mg IV  DESCRIPTION OF PROCEDURE:   The [id number] ([id number])  endoscope was introduced through the mouth and advanced to the second portion of the duodenum without difficulty or limitations.  The mucosal surfaces were surveyed very carefully during advancement of the scope and upon withdrawal.  Retroflexion view of the proximal stomach and esophagogastric junction was performed.      FINDINGS: Schatzki's ring present; no esophagitis. No Barrett's esophagus. The scope was easily advanced across the GE junction. Stomach empty Small hiatal hernia. Scattered benign gastric erosions. No ulcer or infiltrating process. Patent pylorus. Examination of bulb, second and third portion revealed fine erosions of this portion of the GI tract as well.  THERAPEUTIC / DIAGNOSTIC MANEUVERS PERFORMED:  Biopsies of the gastric and duodenal mucosa taken for histologic study   COMPLICATIONS:  None  IMPRESSION:    Schatzki's ring - not manipulated because no dysphagia currently. Small hiatal hernia.  Gastric and duodenal erosions of uncertain significance-status post biopsy  RECOMMENDATIONS:     Continue  protonix daily. Followup on pathology. See colonoscopy report.    _______________________________ R. Garfield Cornea, MD FACP [facility] eSigned:  R. Garfield Cornea, MD FACP [facility] 10/28/2013 1:31 PM     CC:

## 2013-10-31 ENCOUNTER — Encounter (HOSPITAL_COMMUNITY): Payer: Self-pay | Admitting: Internal Medicine

## 2013-11-02 NOTE — H&P (View-Only) (Signed)
Referring Provider: Delphina Cahill, MD Primary Care Physician:  Delphina Cahill, MD Primary Gastroenterologist:  Dr. Gala Romney   Chief Complaint  Patient presents with  . Colonoscopy    HPI:   Isaac Guerrero presents today at the request of Dr. [place] Crane due to anemia and updated colonoscopy Last colonoscopy in Dec 2010 by [facility] in [place]. Polypectomy at that time per patient. We have requested records. No bright red blood, melena. No abdominal pain. No constipation, diarrhea, changes in bowel habits. Protonix for GERD. No dysphagia.   Outside labs from Oct 2014 reviewed with Hgb low normal at 13.3. Iron low at 32. No ferritin on file.   Past Medical History  Diagnosis Date  . GERD (gastroesophageal reflux disease) 11/29/1998  . Hyperlipidemia 08/1997  . Hypothyroidism 1983  . BPH (benign prostatic hyperplasia) 08/1989  . Spermatocele     per Dr. Jeffie Pollock with [facility]    Past Surgical History  Procedure Laterality Date  . Cystoscopy  08/1989    normal  . Esophagogastroduodenoscopy  10/1999    distal esoph. stricture; sliding H. H.  . Lumbar microdisectomy  08/2000    Dr. Hal Neer  . Cataract extraction  03/2003    left eye  . Lasik  04/12/2003    right eye  . Colonoscopy  08/10/09    polyp in ascending colon otherwise normal    Current Outpatient Prescriptions  Medication Sig Dispense Refill  . ANDROGEL PUMP 20.25 MG/ACT (1.62%) GEL       . aspirin 81 MG tablet Take 81 mg by mouth daily.      [name] b complex vitamins tablet Take 1 tablet by mouth daily.        . Cholecalciferol (VITAMIN D3) 2000 UNITS TABS Take by mouth daily.        . ciclesonide (OMNARIS) 50 MCG/ACT nasal spray Place 1 spray into both nostrils as needed.        . Coenzyme Q10 200 MG capsule Take 200 mg by mouth daily.        [name] levothyroxine (LEVOXYL) 88 MCG tablet Take 88 mcg by mouth daily.        . Multiple Vitamin (MULTIVITAMIN) capsule Take 1 capsule by mouth daily.      . pantoprazole  (PROTONIX) 40 MG tablet Take 40 mg by mouth daily.      . vitamin C (ASCORBIC ACID) 500 MG tablet Take 500 mg by mouth daily.        [name] ZOCOR 40 MG tablet TAKE ONE TABLET BY MOUTH IN THE EVENING.  30 each  4   No current facility-administered medications for this visit.    Allergies as of 10/06/2013 - Review Complete 10/06/2013  Allergen Reaction Noted  . Cephalexin  01/15/2007  . Penicillins  01/15/2007  . Sulfonamide derivatives  01/15/2007    Family History  Problem Relation Age of Onset  . Hypertension Mother   . Hyperlipidemia Mother   . Diabetes Mother 63  . Heart disease Father     CABG X 4  12/2001  . Hyperlipidemia Father   . Hypertension Father   . Heart disease Brother     CABG X 1  . Diabetes Maternal Uncle   . Arthritis Maternal Uncle   . Heart disease Paternal Uncle     MI  . Cancer Maternal Grandfather     colon  . Stroke Paternal Grandfather     History   Social History  .  Marital Status: Married    Spouse Name: N/A    Number of Children: 1  . Years of Education: N/A   Occupational History  . Mechanic     [employer]   Social History Main Topics  . Smoking status: Never Smoker   . Smokeless tobacco: Never Used  . Alcohol Use: No  . Drug Use: No  . Sexual Activity: Yes   Other Topics Concern  . Not on file   Social History Narrative   Married and lives with wife    Review of Systems: Gen: Denies any fever, chills, loss of appetite, fatigue, weight loss. CV: Denies chest pain, heart palpitations, syncope, peripheral edema. Resp: +DOE GI: see HPI GU : Denies urinary burning, urinary frequency, urinary incontinence.  MS: Denies joint pain, muscle weakness, cramps, limited movement Derm: Denies rash, itching, dry skin Psych: Denies depression, anxiety, confusion or memory loss  Heme: Denies bruising, bleeding, and enlarged lymph nodes.  Physical Exam: BP 146/85  Pulse 82  Temp(Src) 97.6 F (36.4 C) (Oral)  Wt 191 lb 12.8 oz  (87 kg) General:   Alert and oriented. Well-developed, well-nourished, pleasant and cooperative. Head:  Normocephalic and atraumatic. Eyes:  Conjunctiva pink, sclera clear, no icterus.   Conjunctiva pink. Ears:  Normal auditory acuity. Nose:  No deformity, discharge,  or lesions. Mouth:  No deformity or lesions, mucosa pink and moist.  Neck:  Supple, without mass or thyromegaly. Lungs:  Clear to auscultation bilaterally, without wheezing, rales, or rhonchi.  Heart:  S1, S2 present without murmurs noted.  Abdomen:  +BS, soft, non-tender and non-distended. Without mass or HSM. No rebound or guarding. No hernias noted. Rectal:  Deferred  Msk:  Symmetrical without gross deformities. Normal posture. Extremities:  Without clubbing or edema. Neurologic:  Alert and  oriented x4;  grossly normal neurologically. Skin:  Intact, warm and dry without significant lesions or rashes Cervical Nodes:  No significant cervical adenopathy. Psych:  Alert and cooperative. Normal mood and affect.

## 2013-11-02 NOTE — Interval H&P Note (Signed)
History and Physical Interval Note:  11/02/2013 11:06 AM  Isaac Guerrero  has presented today for surgery, with the diagnosis of ANEMIA  The various methods of treatment have been discussed with the patient and family. After consideration of risks, benefits and other options for treatment, the patient has consented to  Procedure(s) with comments: COLONOSCOPY WITH ESOPHAGOGASTRODUODENOSCOPY (EGD) (N/A) - 1:00 as a surgical intervention .  The patient's history has been reviewed, patient examined, no change in status, stable for surgery.  I have reviewed the patient's chart and labs.  Questions were answered to the patient's satisfaction.     No change. EGD and colonoscopy per plan.The risks, benefits, limitations, imponderables and alternatives regarding both EGD and colonoscopy have been reviewed with the patient. Questions have been answered. All parties agreeable.   Manus Rudd

## 2013-11-04 ENCOUNTER — Telehealth: Payer: Self-pay

## 2013-11-04 ENCOUNTER — Encounter: Payer: Self-pay | Admitting: Internal Medicine

## 2013-11-04 DIAGNOSIS — D649 Anemia, unspecified: Secondary | ICD-10-CM

## 2013-11-04 NOTE — Telephone Encounter (Signed)
Results Cc to PCP  

## 2013-11-04 NOTE — Telephone Encounter (Signed)
Letter from: Daneil Dolin  Reason for Letter: Results Review  Send letter to patient.  Send copy of letter with path to referring provider and PCP.   Patient needs a stool test for occult blood one month from now along with a CBC then office followup with extender in about 6 weeks to decide whether or not a capsule study as needed

## 2013-11-14 ENCOUNTER — Encounter: Payer: Self-pay | Admitting: Internal Medicine

## 2013-11-14 NOTE — Telephone Encounter (Signed)
Pt is aware of OV on 4/28 at 10 with AS and appt card was mailed

## 2013-11-15 ENCOUNTER — Other Ambulatory Visit: Payer: Self-pay

## 2013-11-15 DIAGNOSIS — D649 Anemia, unspecified: Secondary | ICD-10-CM

## 2013-11-15 NOTE — Telephone Encounter (Signed)
Letter has been mailed to pt. Lab order and ifobt are on file to mail to pt in April.

## 2013-11-28 ENCOUNTER — Ambulatory Visit (INDEPENDENT_AMBULATORY_CARE_PROVIDER_SITE_OTHER): Payer: BC Managed Care – PPO | Admitting: Internal Medicine

## 2013-11-28 DIAGNOSIS — D649 Anemia, unspecified: Secondary | ICD-10-CM

## 2013-11-28 LAB — CBC WITH DIFFERENTIAL/PLATELET
Basophils Absolute: 0 10*3/uL (ref 0.0–0.1)
Basophils Relative: 0 % (ref 0–1)
Eosinophils Absolute: 0.3 10*3/uL (ref 0.0–0.7)
Eosinophils Relative: 4 % (ref 0–5)
HCT: 45.2 % (ref 39.0–52.0)
Hemoglobin: 15.9 g/dL (ref 13.0–17.0)
Lymphocytes Relative: 24 % (ref 12–46)
Lymphs Abs: 1.6 10*3/uL (ref 0.7–4.0)
MCH: 27.6 pg (ref 26.0–34.0)
MCHC: 35.2 g/dL (ref 30.0–36.0)
MCV: 78.3 fL (ref 78.0–100.0)
Monocytes Absolute: 0.5 10*3/uL (ref 0.1–1.0)
Monocytes Relative: 7 % (ref 3–12)
Neutro Abs: 4.4 10*3/uL (ref 1.7–7.7)
Neutrophils Relative %: 65 % (ref 43–77)
Platelets: 167 10*3/uL (ref 150–400)
RBC: 5.77 MIL/uL (ref 4.22–5.81)
RDW: 15.8 % — ABNORMAL HIGH (ref 11.5–15.5)
WBC: 6.7 10*3/uL (ref 4.0–10.5)

## 2013-11-28 LAB — IFOBT (OCCULT BLOOD): IFOBT: NEGATIVE

## 2013-11-28 NOTE — Progress Notes (Signed)
Pt return his IFOBT test and it was NEGATIVE

## 2013-12-01 NOTE — Progress Notes (Signed)
Quick Note:  ifobt negative. I will be seeing him the end of April to discuss need for capsule study. ______

## 2013-12-14 ENCOUNTER — Other Ambulatory Visit (HOSPITAL_COMMUNITY): Payer: Self-pay | Admitting: Pulmonary Disease

## 2013-12-14 DIAGNOSIS — R911 Solitary pulmonary nodule: Secondary | ICD-10-CM

## 2013-12-16 ENCOUNTER — Ambulatory Visit (HOSPITAL_COMMUNITY)
Admission: RE | Admit: 2013-12-16 | Discharge: 2013-12-16 | Disposition: A | Discharge status: Home / Self Care | Payer: BC Managed Care – PPO | Source: Ambulatory Visit | Attending: Pulmonary Disease | Admitting: Pulmonary Disease

## 2013-12-16 DIAGNOSIS — R599 Enlarged lymph nodes, unspecified: Secondary | ICD-10-CM | POA: Insufficient documentation

## 2013-12-16 DIAGNOSIS — Z09 Encounter for follow-up examination after completed treatment for conditions other than malignant neoplasm: Secondary | ICD-10-CM | POA: Insufficient documentation

## 2013-12-16 DIAGNOSIS — R918 Other nonspecific abnormal finding of lung field: Secondary | ICD-10-CM | POA: Insufficient documentation

## 2013-12-16 DIAGNOSIS — R911 Solitary pulmonary nodule: Secondary | ICD-10-CM | POA: Insufficient documentation

## 2013-12-27 ENCOUNTER — Ambulatory Visit (INDEPENDENT_AMBULATORY_CARE_PROVIDER_SITE_OTHER): Payer: BC Managed Care – PPO | Admitting: Gastroenterology

## 2013-12-27 ENCOUNTER — Encounter: Payer: Self-pay | Admitting: Gastroenterology

## 2013-12-27 VITALS — BP 149/88 | HR 78 | Temp 97.4°F | Ht 70.0 in | Wt 191.8 lb

## 2013-12-27 DIAGNOSIS — D649 Anemia, unspecified: Secondary | ICD-10-CM

## 2013-12-27 NOTE — Progress Notes (Signed)
cc'd to pcp 

## 2013-12-27 NOTE — Progress Notes (Signed)
Referring Provider: Delphina Cahill, MD Primary Care Physician:  Delphina Cahill, MD Primary GI: Dr. Gala Romney   Chief Complaint  Patient presents with  . Follow-up    HPI:   Isaac Guerrero presents today in follow-up after EGD/TCS, which was performed due to anemia. Hgb was low normal at 13.3, iron 32, ferritin 11. Recent hemoccult negative. Hgb now 15.9, March 2015. No concerning features. No abdominal pain, melena, hematochezia, weight loss, lack of appetite. Procedures unrevealing. Negative celiac, negative H.pylori. No overt signs of GI bleeding. Does endorse fatigue chronically.    Past Medical History  Diagnosis Date  . GERD (gastroesophageal reflux disease) 11/29/1998  . Hyperlipidemia 08/1997  . Hypothyroidism 1983  . BPH (benign prostatic hyperplasia) 08/1989  . Spermatocele     per Dr. Jeffie Pollock with [facility]    Past Surgical History  Procedure Laterality Date  . Cystoscopy  08/1989    normal  . Esophagogastroduodenoscopy  10/1999    distal esoph. stricture; sliding H. H.  . Lumbar microdisectomy  08/2000    Dr. Hal Neer  . Cataract extraction  03/2003    left eye  . Lasik  04/12/2003    right eye  . Colonoscopy  08/10/09    polyp in ascending colon otherwise normal  . Colonoscopy with esophagogastroduodenoscopy (egd) N/A 10/28/2013    Dr. Rourk:Schatzki's ring - not manipulated because no dysphagia currently. Small hiatal hernia.  Gastric and duodenal erosions of uncertain significance-status post biopsy. path with benign findings, no villous atrophy, no H.pylori/TCS:Colonic diverticulosis    Current Outpatient Prescriptions  Medication Sig Dispense Refill  . ANDROGEL PUMP 20.25 MG/ACT (1.62%) GEL Apply topically See admin instructions. Apply one pump to either shoulder once daily      . aspirin 81 MG tablet Take 81 mg by mouth daily.      [name] b complex vitamins tablet Take 1 tablet by mouth daily.        . Cholecalciferol (VITAMIN D3) 2000 UNITS TABS Take by mouth daily.        .  Coenzyme Q10 200 MG capsule Take 200 mg by mouth daily.        [name] levothyroxine (LEVOXYL) 88 MCG tablet Take 88 mcg by mouth daily.        . Multiple Vitamin (MULTIVITAMIN) capsule Take 1 capsule by mouth daily.      . pantoprazole (PROTONIX) 40 MG tablet Take 40 mg by mouth daily.      . vitamin C (ASCORBIC ACID) 500 MG tablet Take 500 mg by mouth daily.        [name] ZOCOR 40 MG tablet TAKE ONE TABLET BY MOUTH IN THE EVENING.  30 each  4   No current facility-administered medications for this visit.    Allergies as of 12/27/2013 - Review Complete 12/27/2013  Allergen Reaction Noted  . Cephalexin Rash 01/15/2007  . Penicillins Rash 01/15/2007  . Sulfonamide derivatives Rash 01/15/2007    Family History  Problem Relation Age of Onset  . Hypertension Mother   . Hyperlipidemia Mother   . Diabetes Mother 69  . Heart disease Father     CABG X 4  12/2001  . Hyperlipidemia Father   . Hypertension Father   . Heart disease Brother     CABG X 1  . Diabetes Maternal Uncle   . Arthritis Maternal Uncle   . Heart disease Paternal Uncle     MI  . Cancer Maternal Grandfather     colon  .  Stroke Paternal Grandfather     History   Social History  . Marital Status: Married    Spouse Name: N/A    Number of Children: 1  . Years of Education: N/A   Occupational History  . Mechanic     [employer]   Social History Main Topics  . Smoking status: Never Smoker   . Smokeless tobacco: Never Used  . Alcohol Use: No  . Drug Use: No  . Sexual Activity: Yes   Other Topics Concern  . None   Social History Narrative   Married and lives with wife    Review of Systems: As mentioned in HPI  Physical Exam: BP 149/88  Pulse 78  Temp(Src) 97.4 F (36.3 C) (Oral)  Ht 5\' 10"  (1.778 m)  Wt 191 lb 12.8 oz (87 kg)  BMI 27.52 kg/m2 General:   Alert and oriented. No distress noted. Pleasant and cooperative.  Head:  Normocephalic and atraumatic. Eyes:  Conjuctiva clear without scleral  icterus. Abdomen:  +BS, soft, non-tender and non-distended. No rebound or guarding. No HSM or masses noted. Msk:  Symmetrical without gross deformities.  Extremities:  Without edema. Neurologic:  Alert and  oriented x4;  grossly normal neurologically. Psych:  Alert and cooperative. Normal mood and affect.  Lab Results  Component Value Date   WBC 6.7 11/28/2013   HGB 15.9 11/28/2013   HCT 45.2 11/28/2013   MCV 78.3 11/28/2013   PLT 167 11/28/2013   Lab Results  Component Value Date   IRON 50 10/06/2013   FERRITIN 11* 10/06/2013

## 2013-12-27 NOTE — Assessment & Plan Note (Signed)
58 year old with low-normal Hgb several months ago and ferritin 11, now with improvement of Hgb to 15.9. Heme negative. TCS/EGD without significant findings. No concerning signs, overt signs of GI bleeding. With heme negative status, will monitor serially for now and obtain iron and ferritin again in 6 weeks. At that time, obtain repeat ifobt. If no improvement or heme positive, proceed with capsule study. Patient is hesitant to pursue currently but understands the need for further evaluation if evidence of occult GI blood loss or anemia. Start iron 325 mg daily.

## 2013-12-27 NOTE — Patient Instructions (Signed)
Start taking iron 325 mg by mouth daily. This is over the counter. Watch for constipation.   I would like to recheck your blood work in about 6 weeks. At that time, I would also like to obtain a stool sample again. If anything has changed or you have blood in your stool, I recommend a capsule study.

## 2014-01-20 ENCOUNTER — Other Ambulatory Visit: Payer: Self-pay | Admitting: Gastroenterology

## 2014-01-20 ENCOUNTER — Other Ambulatory Visit: Payer: Self-pay

## 2014-01-20 DIAGNOSIS — D649 Anemia, unspecified: Secondary | ICD-10-CM

## 2014-01-31 ENCOUNTER — Ambulatory Visit (INDEPENDENT_AMBULATORY_CARE_PROVIDER_SITE_OTHER): Payer: BC Managed Care – PPO | Admitting: Gastroenterology

## 2014-01-31 DIAGNOSIS — D649 Anemia, unspecified: Secondary | ICD-10-CM

## 2014-01-31 LAB — FERRITIN: Ferritin: 34 ng/mL (ref 22–322)

## 2014-01-31 LAB — IFOBT (OCCULT BLOOD): IFOBT: NEGATIVE

## 2014-01-31 NOTE — Progress Notes (Signed)
Pt returned ifobt test (-) negative

## 2014-02-08 NOTE — Progress Notes (Signed)
Quick Note:  Ferritin improved to 34 with iron. Low normal.  Let's recheck CBC and ferritin in 3 months. ifobt was negative. ______

## 2014-02-17 ENCOUNTER — Other Ambulatory Visit: Payer: Self-pay | Admitting: Gastroenterology

## 2014-02-17 DIAGNOSIS — D649 Anemia, unspecified: Secondary | ICD-10-CM

## 2014-04-13 ENCOUNTER — Other Ambulatory Visit: Payer: Self-pay

## 2014-04-13 DIAGNOSIS — D649 Anemia, unspecified: Secondary | ICD-10-CM

## 2014-05-17 LAB — CBC WITH DIFFERENTIAL/PLATELET
Basophils Absolute: 0 10*3/uL (ref 0.0–0.1)
Basophils Relative: 1 % (ref 0–1)
Eosinophils Absolute: 0.3 10*3/uL (ref 0.0–0.7)
Eosinophils Relative: 6 % — ABNORMAL HIGH (ref 0–5)
HCT: 47.9 % (ref 39.0–52.0)
Hemoglobin: 17 g/dL (ref 13.0–17.0)
Lymphocytes Relative: 27 % (ref 12–46)
Lymphs Abs: 1.3 10*3/uL (ref 0.7–4.0)
MCH: 29.9 pg (ref 26.0–34.0)
MCHC: 35.5 g/dL (ref 30.0–36.0)
MCV: 84.3 fL (ref 78.0–100.0)
Monocytes Absolute: 0.3 10*3/uL (ref 0.1–1.0)
Monocytes Relative: 7 % (ref 3–12)
Neutro Abs: 2.8 10*3/uL (ref 1.7–7.7)
Neutrophils Relative %: 59 % (ref 43–77)
Platelets: 151 10*3/uL (ref 150–400)
RBC: 5.68 MIL/uL (ref 4.22–5.81)
RDW: 13.8 % (ref 11.5–15.5)
WBC: 4.8 10*3/uL (ref 4.0–10.5)

## 2014-05-17 LAB — FERRITIN: Ferritin: 56 ng/mL (ref 22–322)

## 2014-05-25 NOTE — Progress Notes (Signed)
Quick Note:  Hgb normal, ferritin normal and continues to improve.  Continue iron supplementation. Recheck CBC, iron, and ferritin in 6 months. ______

## 2014-05-31 ENCOUNTER — Other Ambulatory Visit: Payer: Self-pay | Admitting: Gastroenterology

## 2014-05-31 DIAGNOSIS — D649 Anemia, unspecified: Secondary | ICD-10-CM

## 2014-06-16 ENCOUNTER — Other Ambulatory Visit: Payer: Self-pay

## 2014-10-23 ENCOUNTER — Other Ambulatory Visit: Payer: Self-pay

## 2014-10-23 DIAGNOSIS — D649 Anemia, unspecified: Secondary | ICD-10-CM

## 2016-11-11 ENCOUNTER — Ambulatory Visit: Payer: Self-pay | Admitting: Allergy & Immunology

## 2016-12-23 ENCOUNTER — Ambulatory Visit (INDEPENDENT_AMBULATORY_CARE_PROVIDER_SITE_OTHER): Payer: BLUE CROSS/BLUE SHIELD | Admitting: Allergy & Immunology

## 2016-12-23 ENCOUNTER — Encounter: Payer: Self-pay | Admitting: Allergy & Immunology

## 2016-12-23 VITALS — BP 120/72 | HR 65 | Temp 98.0°F | Ht 70.0 in | Wt 189.4 lb

## 2016-12-23 DIAGNOSIS — J310 Chronic rhinitis: Secondary | ICD-10-CM | POA: Diagnosis not present

## 2016-12-23 DIAGNOSIS — J3089 Other allergic rhinitis: Secondary | ICD-10-CM | POA: Diagnosis not present

## 2016-12-23 MED ORDER — AZELASTINE-FLUTICASONE 137-50 MCG/ACT NA SUSP: 2.0000 | Freq: Every day | NASAL | 3 refills | Status: DC

## 2016-12-23 NOTE — Progress Notes (Addendum)
NEW PATIENT  Date of Service/Encounter:  12/23/16  Referring provider: Wende Neighbors, MD   Assessment:   Chronic rhinitis (trees, grasses, weeds, ragweed, molds)   Plan/Recommendations:   1. Chronic rhinitis - Testing today showed: positives to maple, grass mix, weed mix, ragweed mix, and all mold mixes - Avoidance measures provided. - Continue with nasal saline rinses, but do it every day. - After using the nasal saline rinses, use Dymista two sprays per nostril twice daily. - This medication contains a nasal steroid and a nasal antihistamine.  - Continue with Benadryl nightly as needed.  - Consider allergy shots for better control. - Call your insurance company about shots and make sure that you are going to be fine with the copays.  Wynona Luna teaching provided in case he decides to start shots.  2. Return in about 6 months (around 06/24/2017).   Subjective:   Isaac Guerrero is a 61 y.o. male presenting today for evaluation of  Chief Complaint  Patient presents with  . New Evaluation  . Allergy Testing    Was on shots a few years ago. No antihistamines x 3 days.     Isaac Guerrero has a history of the following: Patient Active Problem List   Diagnosis Date Noted  . Anemia 10/10/2013  . Knee pain 05/14/2011  . PALPITATIONS 07/21/2010  . LOW BACK PAIN, CHRONIC 03/27/2009  . HYPOTHYROIDISM 09/23/2007  . HYPERLIPIDEMIA 09/23/2007  . GERD 09/23/2007  . BENIGN PROSTATIC HYPERTROPHY 09/23/2007  . PSORIASIS 09/22/2007  . HYPERGLYCEMIA 09/22/2007    History obtained from: chart review and patient.  Isaac Guerrero was referred by Wende Neighbors, MD.     Isaac Guerrero is a 61 y.o. male presenting for evaluation of allergic rhinitis. He was on shots previously by Dr. Donneta Romberg for a total of five years, last stopped 4-5 years ago. He got tired of the driving and reports that he was not extremely compliant with it. Symptoms did improve but now they seem to be returning. He endorses mostly nasal  congestion. He also will have coughing with postnasal drip. He treats this with benadryl and Afrin nasal spray. He is not using Afrin on a daily basis and tries to use it only when he definitely needs it. He does have some nasal steroid but he only uses it occasionally. He has tried other antihistamines but they either cause excessive sleepiness or increased back pain (Claritin). He has tried Human resources officer, Xyzal, and Zyrtec but he develops excessive sleepiness. He does use nasal rinses. He does get sinus infections over the last couple of years. He estimates that he gets antibiotics 1-2 times per year. Symptoms usually worsen in the winter, with the best time of the year being summer.  Isaac Guerrero does have a history of back pain secondary to his work in the [employer]. He He does not have have a diagnosis of asthma. He has no concerns with food allergies. 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 Otherwise, there is no history of other atopic diseases, including asthma, drug allergies, food allergies, stinging insect allergies, or urticaria. There is no significant infectious history. Vaccinations are up to date. He has lived in [place] for his entire life.    Past Medical History: Patient Active Problem List   Diagnosis Date Noted  . Anemia 10/10/2013  . Knee pain 05/14/2011  . PALPITATIONS 07/21/2010  . LOW BACK PAIN, CHRONIC 03/27/2009  .  HYPOTHYROIDISM 09/23/2007  . HYPERLIPIDEMIA 09/23/2007  . GERD 09/23/2007  . BENIGN PROSTATIC HYPERTROPHY 09/23/2007  . PSORIASIS 09/22/2007  . HYPERGLYCEMIA 09/22/2007    Medication List:  Allergies as of 12/23/2016      Reactions   Cephalexin Rash   Penicillins Rash   Sulfonamide Derivatives Rash      Medication List       Accurate as of 12/23/16 10:11 AM. Always use your most recent med list.          ANDROGEL PUMP 20.25 MG/ACT (1.62%) Gel Generic drug:  Testosterone Apply topically See admin  instructions. Apply one pump to either shoulder once daily   aspirin 81 MG tablet Take 81 mg by mouth daily.   b complex vitamins tablet Take 1 tablet by mouth daily.   Coenzyme Q10 200 MG capsule Take 200 mg by mouth daily.   diphenhydrAMINE 25 mg capsule Commonly known as:  BENADRYL Take 25 mg by mouth every 6 (six) hours as needed.   ferrous sulfate 325 (65 FE) MG tablet Take 325 mg by mouth daily with breakfast.   LEVOXYL 88 MCG tablet Generic drug:  levothyroxine Take 88 mcg by mouth daily.   multivitamin capsule Take 1 capsule by mouth daily.   pantoprazole 40 MG tablet Commonly known as:  PROTONIX Take 40 mg by mouth daily.   traMADol 50 MG tablet Commonly known as:  ULTRAM Take 50 mg by mouth every 6 (six) hours as needed.   Turmeric 500 MG Tabs Take 500 mg by mouth daily.   vitamin C 500 MG tablet Commonly known as:  ASCORBIC ACID Take 500 mg by mouth daily.   Vitamin D3 2000 units Tabs Take by mouth daily.   ZOCOR 40 MG tablet Generic drug:  simvastatin TAKE ONE TABLET BY MOUTH IN THE EVENING.       Birth History: non-contributory.   Developmental History: non-contributory.   Past Surgical History: Past Surgical History:  Procedure Laterality Date  . CATARACT EXTRACTION  03/2003   left eye  . COLONOSCOPY  08/10/09   polyp in ascending colon otherwise normal  . COLONOSCOPY WITH ESOPHAGOGASTRODUODENOSCOPY (EGD) N/A 10/28/2013   Dr. Rourk:Schatzki's ring - not manipulated because no dysphagia currently. Small hiatal hernia.  Gastric and duodenal erosions of uncertain significance-status post biopsy. path with benign findings, no villous atrophy, no H.pylori/TCS:Colonic diverticulosis  . CYSTOSCOPY  08/1989   normal  . ESOPHAGOGASTRODUODENOSCOPY  10/1999   distal esoph. stricture; sliding H. H.  . LASIK  04/12/2003   right eye  . lumbar microdisectomy  08/2000   Dr. Hal Neer     Family History: Family History  Problem Relation Age of  Onset  . Hypertension Mother   . Hyperlipidemia Mother   . Diabetes Mother 75  . Heart disease Father     CABG X 4  12/2001  . Hyperlipidemia Father   . Hypertension Father   . Heart disease Brother     CABG X 1  . Heart disease Paternal Uncle     MI  . Cancer Maternal Grandfather     colon  . Diabetes Maternal Uncle   . Arthritis Maternal Uncle   . Stroke Paternal Grandfather   . Allergic rhinitis Neg Hx   . Angioedema Neg Hx   . Asthma Neg Hx   . Atopy Neg Hx   . Eczema Neg Hx   . Immunodeficiency Neg Hx   . Urticaria Neg Hx      Social History: Isaac Guerrero  lives at home with his wife. They have one child who is grown, aged 32yo. He works as an Cabin crew. He lives [place]. They live in a 63yo home. There is wood flooring throughout the home. There are no animals inside of outside of the home. There are no dust mite coverings on the bedding. There is no smoking exposure. He and his wife have been married for 43 years.     Review of Systems: a 14-point review of systems is pertinent for what is mentioned in HPI.  Otherwise, all other systems were negative. Constitutional: negative other than that listed in the HPI Eyes: negative other than that listed in the HPI Ears, nose, mouth, throat, and face: negative other than that listed in the HPI Respiratory: negative other than that listed in the HPI Cardiovascular: negative other than that listed in the HPI Gastrointestinal: negative other than that listed in the HPI Genitourinary: negative other than that listed in the HPI Integument: negative other than that listed in the HPI Hematologic: negative other than that listed in the HPI Musculoskeletal: negative other than that listed in the HPI Neurological: negative other than that listed in the HPI Allergy/Immunologic: negative other than that listed in the HPI    Objective:   Blood pressure 120/72, pulse 65, temperature 98 F (36.7 C), temperature source Oral,  height 5\' 10"  (1.778 m), weight 189 lb 6.4 oz (85.9 kg), SpO2 95 %. Body mass index is 27.18 kg/m.   Physical Exam:  General: Alert, interactive, in no acute distress. Cooperative with the exam.  Eyes: No conjunctival injection present on the right, No conjunctival injection present on the left, PERRL bilaterally, No discharge on the right, No discharge on the left and No Horner-Trantas dots present Ears: Right TM pearly gray with normal light reflex, Left TM pearly gray with normal light reflex, Right TM intact without perforation and Left TM intact without perforation.  Nose/Throat: External nose within normal limits and septum midline, turbinates markedly edematous with clear discharge, post-pharynx markedly erythematous with cobblestoning in the posterior oropharynx. Tonsils 2+ without exudates Neck: Supple without thyromegaly.  Adenopathy: no enlarged lymph nodes appreciated in the anterior cervical, occipital, axillary, epitrochlear, inguinal, or popliteal regions Lungs: Clear to auscultation without wheezing, rhonchi or rales. No increased work of breathing. CV: Normal S1/S2, no murmurs. Capillary refill <2 seconds.  Abdomen: Nondistended, nontender. No guarding or rebound tenderness. Bowel sounds present in all fields and hyperactive  Skin: Warm and dry, without lesions or rashes. Extremities:  No clubbing, cyanosis or edema. Neuro:   Grossly intact. No focal deficits appreciated. Responsive to questions.  Diagnostic studies:   Allergy Studies:   Indoor/Outdoor Percutaneous Adult Environmental Panel: positive to maple. Otherwise negative with adequate controls.  Indoor/Outdoor Selected Intradermal Environmental Panel: positive to [place] grass, Johnson grass, Grass mix, ragweed mix, weed mix, mold mix #1, mold mix #2, mold mix #3 and mold mix #4. Otherwise negative with adequate controls.    Salvatore Marvel, MD [place] [facility] [place]

## 2016-12-23 NOTE — Patient Instructions (Addendum)
1. Chronic rhinitis - Testing today showed: positives to maple, grass mix, weed mix, ragweed mix, and all mold mixes - Avoidance measures provided. - Continue with nasal saline rinses, but do it every day. - After using the nasal saline rinses, use Dymista two sprays per nostril twice daily. - This medication contains a nasal steroid and a nasal antihistamine.  - Continue with Benadryl nightly as needed.  - Consider allergy shots for better control. - Call your insurance company about shots and make sure that you are going to be fine with the copays.   2. Return in about 6 months (around 06/24/2017).  Please inform us of any Emergency Department visits, hospitalizations, or changes in symptoms. Call us before going to the ED for breathing or allergy symptoms since we might be able to fit you in for a sick visit. Feel free to contact us anytime with any questions, problems, or concerns.  It was a pleasure to meet you today! Happy spring!   Websites that have reliable patient information: 1. American Academy of Asthma, Allergy, and Immunology: www.aaaai.org 2. Food Allergy Research and Education (FARE): foodallergy.org 3. Mothers of Asthmatics: http://www.asthmacommunitynetwork.org 4. American College of Allergy, Asthma, and Immunology: www.acaai.org  Reducing Pollen Exposure  The American Academy of Allergy, Asthma and Immunology suggests the following steps to reduce your exposure to pollen during allergy seasons.    1. Do not hang sheets or clothing out to dry; pollen may collect on these items. 2. Do not mow lawns or spend time around freshly cut grass; mowing stirs up pollen. 3. Keep windows closed at night.  Keep car windows closed while driving. 4. Minimize morning activities outdoors, a time when pollen counts are usually at their highest. 5. Stay indoors as much as possible when pollen counts or humidity is high and on windy days when pollen tends to remain in the air  longer. 6. Use air conditioning when possible.  Many air conditioners have filters that trap the pollen spores. 7. Use a HEPA room air filter to remove pollen form the indoor air you breathe.  Control of Mold Allergen  Mold and fungi can grow on a variety of surfaces provided certain temperature and moisture conditions exist.  Outdoor molds grow on plants, decaying vegetation and soil.  The major outdoor mold, Alternaria and Cladosporium, are found in very high numbers during hot and dry conditions.  Generally, a late Summer - Fall peak is seen for common outdoor fungal spores.  Rain will temporarily lower outdoor mold spore count, but counts rise rapidly when the rainy period ends.  The most important indoor molds are Aspergillus and Penicillium.  Dark, humid and poorly ventilated basements are ideal sites for mold growth.  The next most common sites of mold growth are the bathroom and the kitchen.  Outdoor [employer] 1. Use air conditioning and keep windows closed 2. Avoid exposure to decaying vegetation. 3. Avoid leaf raking. 4. Avoid grain handling. 5. Consider wearing a face mask if working in moldy areas.  Indoor Mold Control 1. Maintain humidity below 50%. 2. Clean washable surfaces with 5% bleach solution. 3. Remove sources e.g. contaminated carpets.

## 2016-12-25 NOTE — Addendum Note (Signed)
Addended by: Valentina Shaggy on: 12/25/2016 11:23 PM   Modules accepted: Orders

## 2016-12-30 DIAGNOSIS — J301 Allergic rhinitis due to pollen: Secondary | ICD-10-CM | POA: Diagnosis not present

## 2016-12-30 NOTE — Progress Notes (Signed)
Vials to be made on 12-30-16 DG/JM

## 2016-12-31 DIAGNOSIS — J3089 Other allergic rhinitis: Secondary | ICD-10-CM | POA: Diagnosis not present

## 2017-01-06 ENCOUNTER — Ambulatory Visit (INDEPENDENT_AMBULATORY_CARE_PROVIDER_SITE_OTHER): Payer: BLUE CROSS/BLUE SHIELD | Admitting: *Deleted

## 2017-01-06 DIAGNOSIS — J309 Allergic rhinitis, unspecified: Secondary | ICD-10-CM

## 2017-01-06 MED ORDER — EPINEPHRINE 0.3 MG/0.3ML IJ SOAJ: 0.3000 mg | Freq: Once | INTRAMUSCULAR | 1 refills | Status: AC

## 2017-01-07 NOTE — Progress Notes (Signed)
Immunotherapy   Patient Details  Name: SADIK PIASCIK MRN: [id number] Date of Birth: 12/21/1955  01/06/2017  Venetia Night Hawe : Patient started allergy injections.  Grass/Weed/Tree and Mold Blue Vial 1:100,000 0.05 of each given. Following schedule: B  Frequency: 1-2 times weekly Epi-Pen: Patient was instructed on proper use of Auvi-Q and RX sent. Consent signed and patient instructions given. Patient waited 60 minutes after injection given and no reaction noted.   Maree Erie 01/07/2017, 2:58 PM

## 2017-01-13 ENCOUNTER — Ambulatory Visit (INDEPENDENT_AMBULATORY_CARE_PROVIDER_SITE_OTHER): Payer: BLUE CROSS/BLUE SHIELD | Admitting: *Deleted

## 2017-01-13 DIAGNOSIS — J309 Allergic rhinitis, unspecified: Secondary | ICD-10-CM | POA: Diagnosis not present

## 2017-01-20 ENCOUNTER — Ambulatory Visit (INDEPENDENT_AMBULATORY_CARE_PROVIDER_SITE_OTHER): Payer: BLUE CROSS/BLUE SHIELD | Admitting: *Deleted

## 2017-01-20 DIAGNOSIS — J309 Allergic rhinitis, unspecified: Secondary | ICD-10-CM

## 2017-01-27 ENCOUNTER — Ambulatory Visit (INDEPENDENT_AMBULATORY_CARE_PROVIDER_SITE_OTHER): Payer: BLUE CROSS/BLUE SHIELD | Admitting: *Deleted

## 2017-01-27 DIAGNOSIS — J309 Allergic rhinitis, unspecified: Secondary | ICD-10-CM | POA: Diagnosis not present

## 2017-02-03 ENCOUNTER — Ambulatory Visit (INDEPENDENT_AMBULATORY_CARE_PROVIDER_SITE_OTHER): Payer: BLUE CROSS/BLUE SHIELD | Admitting: *Deleted

## 2017-02-03 DIAGNOSIS — J309 Allergic rhinitis, unspecified: Secondary | ICD-10-CM

## 2017-02-10 ENCOUNTER — Ambulatory Visit (INDEPENDENT_AMBULATORY_CARE_PROVIDER_SITE_OTHER): Payer: BLUE CROSS/BLUE SHIELD | Admitting: *Deleted

## 2017-02-10 DIAGNOSIS — J309 Allergic rhinitis, unspecified: Secondary | ICD-10-CM

## 2017-02-17 ENCOUNTER — Ambulatory Visit (INDEPENDENT_AMBULATORY_CARE_PROVIDER_SITE_OTHER): Payer: BLUE CROSS/BLUE SHIELD | Admitting: *Deleted

## 2017-02-17 DIAGNOSIS — J309 Allergic rhinitis, unspecified: Secondary | ICD-10-CM | POA: Diagnosis not present

## 2017-02-24 ENCOUNTER — Ambulatory Visit (INDEPENDENT_AMBULATORY_CARE_PROVIDER_SITE_OTHER): Payer: BLUE CROSS/BLUE SHIELD | Admitting: *Deleted

## 2017-02-24 DIAGNOSIS — J309 Allergic rhinitis, unspecified: Secondary | ICD-10-CM | POA: Diagnosis not present

## 2017-03-03 ENCOUNTER — Ambulatory Visit (INDEPENDENT_AMBULATORY_CARE_PROVIDER_SITE_OTHER): Payer: BLUE CROSS/BLUE SHIELD | Admitting: *Deleted

## 2017-03-03 DIAGNOSIS — J309 Allergic rhinitis, unspecified: Secondary | ICD-10-CM

## 2017-03-10 ENCOUNTER — Ambulatory Visit (INDEPENDENT_AMBULATORY_CARE_PROVIDER_SITE_OTHER): Payer: BLUE CROSS/BLUE SHIELD | Admitting: *Deleted

## 2017-03-10 DIAGNOSIS — J309 Allergic rhinitis, unspecified: Secondary | ICD-10-CM

## 2017-03-17 ENCOUNTER — Ambulatory Visit (INDEPENDENT_AMBULATORY_CARE_PROVIDER_SITE_OTHER): Payer: BLUE CROSS/BLUE SHIELD | Admitting: *Deleted

## 2017-03-17 DIAGNOSIS — J309 Allergic rhinitis, unspecified: Secondary | ICD-10-CM | POA: Diagnosis not present

## 2017-03-24 ENCOUNTER — Ambulatory Visit (INDEPENDENT_AMBULATORY_CARE_PROVIDER_SITE_OTHER): Payer: BLUE CROSS/BLUE SHIELD | Admitting: *Deleted

## 2017-03-24 DIAGNOSIS — J309 Allergic rhinitis, unspecified: Secondary | ICD-10-CM | POA: Diagnosis not present

## 2017-03-31 ENCOUNTER — Ambulatory Visit (INDEPENDENT_AMBULATORY_CARE_PROVIDER_SITE_OTHER): Payer: BLUE CROSS/BLUE SHIELD

## 2017-03-31 DIAGNOSIS — J309 Allergic rhinitis, unspecified: Secondary | ICD-10-CM

## 2017-04-07 ENCOUNTER — Ambulatory Visit (INDEPENDENT_AMBULATORY_CARE_PROVIDER_SITE_OTHER): Payer: BLUE CROSS/BLUE SHIELD | Admitting: *Deleted

## 2017-04-07 DIAGNOSIS — J309 Allergic rhinitis, unspecified: Secondary | ICD-10-CM

## 2017-04-14 ENCOUNTER — Ambulatory Visit (INDEPENDENT_AMBULATORY_CARE_PROVIDER_SITE_OTHER): Payer: BLUE CROSS/BLUE SHIELD | Admitting: *Deleted

## 2017-04-14 DIAGNOSIS — J309 Allergic rhinitis, unspecified: Secondary | ICD-10-CM

## 2017-04-21 ENCOUNTER — Ambulatory Visit (INDEPENDENT_AMBULATORY_CARE_PROVIDER_SITE_OTHER): Payer: BLUE CROSS/BLUE SHIELD | Admitting: *Deleted

## 2017-04-21 DIAGNOSIS — J309 Allergic rhinitis, unspecified: Secondary | ICD-10-CM

## 2017-04-28 ENCOUNTER — Ambulatory Visit (INDEPENDENT_AMBULATORY_CARE_PROVIDER_SITE_OTHER): Payer: BLUE CROSS/BLUE SHIELD | Admitting: *Deleted

## 2017-04-28 DIAGNOSIS — J309 Allergic rhinitis, unspecified: Secondary | ICD-10-CM | POA: Diagnosis not present

## 2017-05-05 ENCOUNTER — Ambulatory Visit (INDEPENDENT_AMBULATORY_CARE_PROVIDER_SITE_OTHER): Payer: BLUE CROSS/BLUE SHIELD | Admitting: *Deleted

## 2017-05-05 DIAGNOSIS — J309 Allergic rhinitis, unspecified: Secondary | ICD-10-CM

## 2017-05-12 ENCOUNTER — Ambulatory Visit (INDEPENDENT_AMBULATORY_CARE_PROVIDER_SITE_OTHER): Payer: BLUE CROSS/BLUE SHIELD | Admitting: *Deleted

## 2017-05-12 DIAGNOSIS — J309 Allergic rhinitis, unspecified: Secondary | ICD-10-CM | POA: Diagnosis not present

## 2017-05-19 ENCOUNTER — Ambulatory Visit (INDEPENDENT_AMBULATORY_CARE_PROVIDER_SITE_OTHER): Payer: BLUE CROSS/BLUE SHIELD | Admitting: *Deleted

## 2017-05-19 DIAGNOSIS — J309 Allergic rhinitis, unspecified: Secondary | ICD-10-CM | POA: Diagnosis not present

## 2017-05-26 ENCOUNTER — Ambulatory Visit (INDEPENDENT_AMBULATORY_CARE_PROVIDER_SITE_OTHER): Payer: BLUE CROSS/BLUE SHIELD | Admitting: *Deleted

## 2017-05-26 DIAGNOSIS — J309 Allergic rhinitis, unspecified: Secondary | ICD-10-CM

## 2017-06-02 ENCOUNTER — Ambulatory Visit (INDEPENDENT_AMBULATORY_CARE_PROVIDER_SITE_OTHER): Payer: BLUE CROSS/BLUE SHIELD

## 2017-06-02 DIAGNOSIS — J309 Allergic rhinitis, unspecified: Secondary | ICD-10-CM

## 2017-06-09 ENCOUNTER — Ambulatory Visit (INDEPENDENT_AMBULATORY_CARE_PROVIDER_SITE_OTHER): Payer: BLUE CROSS/BLUE SHIELD

## 2017-06-09 DIAGNOSIS — J309 Allergic rhinitis, unspecified: Secondary | ICD-10-CM

## 2017-06-16 ENCOUNTER — Ambulatory Visit (INDEPENDENT_AMBULATORY_CARE_PROVIDER_SITE_OTHER): Payer: BLUE CROSS/BLUE SHIELD | Admitting: *Deleted

## 2017-06-16 DIAGNOSIS — J309 Allergic rhinitis, unspecified: Secondary | ICD-10-CM | POA: Diagnosis not present

## 2017-06-23 ENCOUNTER — Ambulatory Visit (INDEPENDENT_AMBULATORY_CARE_PROVIDER_SITE_OTHER): Payer: BLUE CROSS/BLUE SHIELD | Admitting: Allergy & Immunology

## 2017-06-23 ENCOUNTER — Encounter: Payer: Self-pay | Admitting: Allergy & Immunology

## 2017-06-23 DIAGNOSIS — J309 Allergic rhinitis, unspecified: Secondary | ICD-10-CM

## 2017-06-23 DIAGNOSIS — J302 Other seasonal allergic rhinitis: Secondary | ICD-10-CM | POA: Diagnosis not present

## 2017-06-23 DIAGNOSIS — J3089 Other allergic rhinitis: Secondary | ICD-10-CM | POA: Diagnosis not present

## 2017-06-23 NOTE — Patient Instructions (Addendum)
1. Chronic rhinitis (maple, grass mix, weed mix, ragweed mix, and all mold mixes) - Continue with nasal saline rinses, but do it every day. - Continue with Benadryl nightly as needed.   2. Return in about 1 year (around 06/23/2018).   Please inform us of any Emergency Department visits, hospitalizations, or changes in symptoms. Call us before going to the ED for breathing or allergy symptoms since we might be able to fit you in for a sick visit. Feel free to contact us anytime with any questions, problems, or concerns.  It was a pleasure to see you again today! Enjoy the fall season!  Websites that have reliable patient information: 1. American Academy of Asthma, Allergy, and Immunology: www.aaaai.org 2. Food Allergy Research and Education (FARE): foodallergy.org 3. Mothers of Asthmatics: http://www.asthmacommunitynetwork.org 4. American College of Allergy, Asthma, and Immunology: www.acaai.org   Election Day is coming up on Tuesday, November 6th! Although it is too late to register to vote by mail, you can still register up to November 5th at any of the early voting locations. Try to early vote in case there are problems with your registration!   If you are turned away at the polls, you have the right to request a provisional ballot, which is required by law!      [place]- [place] (open 8am - 5pm) First Floor [place], [place] (open [place]) [place], [place] (open 7am - 7pm)  [place], [employer] (open 7am - 7pm) [address], [employer] (open 7am - 7pm) [address] [place], [employer] (open 7am - 7pm) [place], [place] (open [place]) [place], [place] (open 7am - 7pm) [place], [employer]  (open 7am - 7pm) [place], [place]

## 2017-06-23 NOTE — Progress Notes (Signed)
FOLLOW UP  Date of Service/Encounter:  06/23/17   Assessment:   Seasonal and perennial allergic rhinitis - on immunotherapy  Plan/Recommendations:   1. Chronic rhinitis (maple, grass mix, weed mix, ragweed mix, and all mold mixes) - Continue with nasal saline rinses, but do it every day. - Continue with Benadryl nightly as needed.   2. Return in about 1 year (around 06/23/2018).  Subjective:   Isaac Guerrero is a 61 y.o. male presenting today for follow up of  Chief Complaint  Patient presents with  . Allergies    YASSEEN SALLS has a history of the following: Patient Active Problem List   Diagnosis Date Noted  . Non-seasonal allergic rhinitis due to fungal spores 12/23/2016  . Anemia 10/10/2013  . Knee pain 05/14/2011  . PALPITATIONS 07/21/2010  . LOW BACK PAIN, CHRONIC 03/27/2009  . HYPOTHYROIDISM 09/23/2007  . HYPERLIPIDEMIA 09/23/2007  . GERD 09/23/2007  . BENIGN PROSTATIC HYPERTROPHY 09/23/2007  . PSORIASIS 09/22/2007  . HYPERGLYCEMIA 09/22/2007    History obtained from: chart review and patient.  Venetia Night Gaskins's Primary Care Provider is Celene Squibb, MD.     Isaac Guerrero is a 61 y.o. male presenting for a follow up visit. He was last seen in April 2018. At that time, he had testing that was positive to maple, grass mix, weed mix, ragweed mix, and all mold mixes. We continued him on an extensive nasal spray regimen as well as Benadryl at night. He has since started immunotherapy.   Since the last visit, he reports that he has done well. He now only uses in his nasal sprays as needed, if at all. He does continue with his benadryl, but does not use this on a regular basis. He has had no intervening sinus infections or problems.   Isaac Guerrero is on allergen immunotherapy. He receives two injections. Immunotherapy script #1 contains trees, weeds and grasses. He currently receives 0.49mL of the RED vial (1/100). Immunotherapy script #2 contains molds. He currently receives 0.73mL of  the RED vial (1/100). He started shots May of 2018 and reached maintenance in September of 2018. He has had no reactions whatsoever, including large local reactions. He does not need FMLA forms filled out, as his job is flexible with regards to his getting his shots.   Otherwise, there have been no changes to his past medical history, surgical history, family history, or social history.    Review of Systems: a 14-point review of systems is pertinent for what is mentioned in HPI.  Otherwise, all other systems were negative. Constitutional: negative other than that listed in the HPI Eyes: negative other than that listed in the HPI Ears, nose, mouth, throat, and face: negative other than that listed in the HPI Respiratory: negative other than that listed in the HPI Cardiovascular: negative other than that listed in the HPI Gastrointestinal: negative other than that listed in the HPI Genitourinary: negative other than that listed in the HPI Integument: negative other than that listed in the HPI Hematologic: negative other than that listed in the HPI Musculoskeletal: negative other than that listed in the HPI Neurological: negative other than that listed in the HPI Allergy/Immunologic: negative other than that listed in the HPI    Objective:   Blood pressure 128/74, pulse 63, resp. rate 18, SpO2 93 %. There is no height or weight on file to calculate BMI.   Physical Exam:  General: Alert, interactive, in no acute distress. Soft spoken. Stoic.  Eyes: No  conjunctival injection bilaterally, no discharge on the right, no discharge on the left and no Horner-Trantas dots present. PERRL bilaterally. EOMI without pain. No photophobia.  Ears: Right TM pearly gray with normal light reflex, Left TM pearly gray with normal light reflex, Right TM intact without perforation and Left TM intact without perforation.  Nose/Throat: External nose within normal limits and septum midline. Turbinates edematous  and pale with clear discharge. Posterior oropharynx erythematous with cobblestoning in the posterior oropharynx. Tonsils 2+ without exudates.  Tongue without thrush. Adenopathy: no enlarged lymph nodes appreciated in the anterior cervical, occipital, axillary, epitrochlear, inguinal, or popliteal regions. Lungs: Clear to auscultation without wheezing, rhonchi or rales. No increased work of breathing. CV: Normal S1/S2. No murmurs. Capillary refill <2 seconds.  Skin: Warm and dry, without lesions or rashes. Neuro:   Grossly intact. No focal deficits appreciated. Responsive to questions.  Diagnostic studies: none      Salvatore Marvel, MD [facility]

## 2017-06-30 ENCOUNTER — Ambulatory Visit (INDEPENDENT_AMBULATORY_CARE_PROVIDER_SITE_OTHER): Payer: BLUE CROSS/BLUE SHIELD | Admitting: *Deleted

## 2017-06-30 DIAGNOSIS — J309 Allergic rhinitis, unspecified: Secondary | ICD-10-CM | POA: Diagnosis not present

## 2017-07-07 ENCOUNTER — Ambulatory Visit (INDEPENDENT_AMBULATORY_CARE_PROVIDER_SITE_OTHER): Payer: BLUE CROSS/BLUE SHIELD | Admitting: *Deleted

## 2017-07-07 DIAGNOSIS — J309 Allergic rhinitis, unspecified: Secondary | ICD-10-CM

## 2017-07-14 ENCOUNTER — Ambulatory Visit (INDEPENDENT_AMBULATORY_CARE_PROVIDER_SITE_OTHER): Payer: BLUE CROSS/BLUE SHIELD

## 2017-07-14 DIAGNOSIS — J309 Allergic rhinitis, unspecified: Secondary | ICD-10-CM | POA: Diagnosis not present

## 2017-07-21 ENCOUNTER — Ambulatory Visit (INDEPENDENT_AMBULATORY_CARE_PROVIDER_SITE_OTHER): Payer: BLUE CROSS/BLUE SHIELD

## 2017-07-21 DIAGNOSIS — J309 Allergic rhinitis, unspecified: Secondary | ICD-10-CM | POA: Diagnosis not present

## 2017-07-28 ENCOUNTER — Ambulatory Visit (INDEPENDENT_AMBULATORY_CARE_PROVIDER_SITE_OTHER): Payer: BLUE CROSS/BLUE SHIELD | Admitting: *Deleted

## 2017-07-28 DIAGNOSIS — J309 Allergic rhinitis, unspecified: Secondary | ICD-10-CM | POA: Diagnosis not present

## 2017-07-28 NOTE — Progress Notes (Signed)
VIALS EXP 07-29-18

## 2017-07-30 DIAGNOSIS — J301 Allergic rhinitis due to pollen: Secondary | ICD-10-CM | POA: Diagnosis not present

## 2017-08-04 ENCOUNTER — Ambulatory Visit (INDEPENDENT_AMBULATORY_CARE_PROVIDER_SITE_OTHER): Payer: BLUE CROSS/BLUE SHIELD

## 2017-08-04 DIAGNOSIS — J309 Allergic rhinitis, unspecified: Secondary | ICD-10-CM | POA: Diagnosis not present

## 2017-08-11 ENCOUNTER — Ambulatory Visit (INDEPENDENT_AMBULATORY_CARE_PROVIDER_SITE_OTHER): Payer: BLUE CROSS/BLUE SHIELD

## 2017-08-11 DIAGNOSIS — J309 Allergic rhinitis, unspecified: Secondary | ICD-10-CM | POA: Diagnosis not present

## 2017-08-18 ENCOUNTER — Ambulatory Visit (INDEPENDENT_AMBULATORY_CARE_PROVIDER_SITE_OTHER): Payer: BLUE CROSS/BLUE SHIELD

## 2017-08-18 DIAGNOSIS — J309 Allergic rhinitis, unspecified: Secondary | ICD-10-CM

## 2017-09-02 ENCOUNTER — Ambulatory Visit (INDEPENDENT_AMBULATORY_CARE_PROVIDER_SITE_OTHER): Payer: BLUE CROSS/BLUE SHIELD

## 2017-09-02 DIAGNOSIS — J309 Allergic rhinitis, unspecified: Secondary | ICD-10-CM

## 2017-09-08 ENCOUNTER — Ambulatory Visit (INDEPENDENT_AMBULATORY_CARE_PROVIDER_SITE_OTHER): Payer: BLUE CROSS/BLUE SHIELD

## 2017-09-08 DIAGNOSIS — J309 Allergic rhinitis, unspecified: Secondary | ICD-10-CM | POA: Diagnosis not present

## 2017-09-15 ENCOUNTER — Ambulatory Visit (INDEPENDENT_AMBULATORY_CARE_PROVIDER_SITE_OTHER): Payer: BLUE CROSS/BLUE SHIELD

## 2017-09-15 DIAGNOSIS — J309 Allergic rhinitis, unspecified: Secondary | ICD-10-CM

## 2017-09-22 ENCOUNTER — Ambulatory Visit (INDEPENDENT_AMBULATORY_CARE_PROVIDER_SITE_OTHER): Payer: BLUE CROSS/BLUE SHIELD

## 2017-09-22 DIAGNOSIS — J309 Allergic rhinitis, unspecified: Secondary | ICD-10-CM | POA: Diagnosis not present

## 2017-09-29 ENCOUNTER — Ambulatory Visit (INDEPENDENT_AMBULATORY_CARE_PROVIDER_SITE_OTHER): Payer: BLUE CROSS/BLUE SHIELD | Admitting: *Deleted

## 2017-09-29 DIAGNOSIS — J309 Allergic rhinitis, unspecified: Secondary | ICD-10-CM

## 2017-10-06 ENCOUNTER — Ambulatory Visit (INDEPENDENT_AMBULATORY_CARE_PROVIDER_SITE_OTHER): Payer: BLUE CROSS/BLUE SHIELD

## 2017-10-06 DIAGNOSIS — J309 Allergic rhinitis, unspecified: Secondary | ICD-10-CM | POA: Diagnosis not present

## 2017-10-13 ENCOUNTER — Ambulatory Visit (INDEPENDENT_AMBULATORY_CARE_PROVIDER_SITE_OTHER): Payer: BLUE CROSS/BLUE SHIELD

## 2017-10-13 DIAGNOSIS — J309 Allergic rhinitis, unspecified: Secondary | ICD-10-CM | POA: Diagnosis not present

## 2017-10-20 ENCOUNTER — Ambulatory Visit (INDEPENDENT_AMBULATORY_CARE_PROVIDER_SITE_OTHER): Payer: BLUE CROSS/BLUE SHIELD

## 2017-10-20 DIAGNOSIS — J309 Allergic rhinitis, unspecified: Secondary | ICD-10-CM | POA: Diagnosis not present

## 2017-10-20 NOTE — Progress Notes (Signed)
VIALS EXP 10-21-18

## 2017-10-21 DIAGNOSIS — J301 Allergic rhinitis due to pollen: Secondary | ICD-10-CM | POA: Diagnosis not present

## 2017-10-27 ENCOUNTER — Ambulatory Visit (INDEPENDENT_AMBULATORY_CARE_PROVIDER_SITE_OTHER): Payer: BLUE CROSS/BLUE SHIELD

## 2017-10-27 DIAGNOSIS — J309 Allergic rhinitis, unspecified: Secondary | ICD-10-CM | POA: Diagnosis not present

## 2017-11-03 ENCOUNTER — Ambulatory Visit (INDEPENDENT_AMBULATORY_CARE_PROVIDER_SITE_OTHER): Payer: BLUE CROSS/BLUE SHIELD | Admitting: *Deleted

## 2017-11-03 DIAGNOSIS — J309 Allergic rhinitis, unspecified: Secondary | ICD-10-CM | POA: Diagnosis not present

## 2017-11-10 ENCOUNTER — Ambulatory Visit (INDEPENDENT_AMBULATORY_CARE_PROVIDER_SITE_OTHER): Payer: BLUE CROSS/BLUE SHIELD | Admitting: *Deleted

## 2017-11-10 DIAGNOSIS — J309 Allergic rhinitis, unspecified: Secondary | ICD-10-CM

## 2017-11-17 ENCOUNTER — Ambulatory Visit (INDEPENDENT_AMBULATORY_CARE_PROVIDER_SITE_OTHER): Payer: BLUE CROSS/BLUE SHIELD

## 2017-11-17 DIAGNOSIS — J309 Allergic rhinitis, unspecified: Secondary | ICD-10-CM | POA: Diagnosis not present

## 2017-12-01 ENCOUNTER — Ambulatory Visit (INDEPENDENT_AMBULATORY_CARE_PROVIDER_SITE_OTHER): Payer: BLUE CROSS/BLUE SHIELD

## 2017-12-01 DIAGNOSIS — J309 Allergic rhinitis, unspecified: Secondary | ICD-10-CM

## 2017-12-08 ENCOUNTER — Ambulatory Visit (INDEPENDENT_AMBULATORY_CARE_PROVIDER_SITE_OTHER): Payer: BLUE CROSS/BLUE SHIELD | Admitting: *Deleted

## 2017-12-08 DIAGNOSIS — J309 Allergic rhinitis, unspecified: Secondary | ICD-10-CM | POA: Diagnosis not present

## 2017-12-15 ENCOUNTER — Ambulatory Visit (INDEPENDENT_AMBULATORY_CARE_PROVIDER_SITE_OTHER): Payer: BLUE CROSS/BLUE SHIELD

## 2017-12-15 DIAGNOSIS — J309 Allergic rhinitis, unspecified: Secondary | ICD-10-CM | POA: Diagnosis not present

## 2017-12-22 ENCOUNTER — Ambulatory Visit (INDEPENDENT_AMBULATORY_CARE_PROVIDER_SITE_OTHER): Payer: BLUE CROSS/BLUE SHIELD | Admitting: *Deleted

## 2017-12-22 DIAGNOSIS — J309 Allergic rhinitis, unspecified: Secondary | ICD-10-CM

## 2017-12-29 ENCOUNTER — Ambulatory Visit (INDEPENDENT_AMBULATORY_CARE_PROVIDER_SITE_OTHER): Payer: BLUE CROSS/BLUE SHIELD

## 2017-12-29 DIAGNOSIS — J309 Allergic rhinitis, unspecified: Secondary | ICD-10-CM | POA: Diagnosis not present

## 2018-01-05 ENCOUNTER — Ambulatory Visit (INDEPENDENT_AMBULATORY_CARE_PROVIDER_SITE_OTHER): Payer: BLUE CROSS/BLUE SHIELD | Admitting: *Deleted

## 2018-01-05 DIAGNOSIS — J309 Allergic rhinitis, unspecified: Secondary | ICD-10-CM

## 2018-01-12 ENCOUNTER — Ambulatory Visit (INDEPENDENT_AMBULATORY_CARE_PROVIDER_SITE_OTHER): Payer: BLUE CROSS/BLUE SHIELD | Admitting: *Deleted

## 2018-01-12 DIAGNOSIS — J309 Allergic rhinitis, unspecified: Secondary | ICD-10-CM | POA: Diagnosis not present

## 2018-01-13 ENCOUNTER — Encounter: Payer: Self-pay | Admitting: *Deleted

## 2018-01-13 NOTE — Progress Notes (Signed)
RED VIALS MADE

## 2018-01-15 DIAGNOSIS — J301 Allergic rhinitis due to pollen: Secondary | ICD-10-CM | POA: Diagnosis not present

## 2018-01-19 ENCOUNTER — Ambulatory Visit (INDEPENDENT_AMBULATORY_CARE_PROVIDER_SITE_OTHER): Payer: BLUE CROSS/BLUE SHIELD

## 2018-01-19 DIAGNOSIS — J309 Allergic rhinitis, unspecified: Secondary | ICD-10-CM

## 2018-01-26 ENCOUNTER — Ambulatory Visit (INDEPENDENT_AMBULATORY_CARE_PROVIDER_SITE_OTHER): Payer: BLUE CROSS/BLUE SHIELD | Admitting: *Deleted

## 2018-01-26 DIAGNOSIS — J309 Allergic rhinitis, unspecified: Secondary | ICD-10-CM

## 2018-02-02 ENCOUNTER — Ambulatory Visit (INDEPENDENT_AMBULATORY_CARE_PROVIDER_SITE_OTHER): Payer: BLUE CROSS/BLUE SHIELD | Admitting: *Deleted

## 2018-02-02 DIAGNOSIS — J309 Allergic rhinitis, unspecified: Secondary | ICD-10-CM

## 2018-02-09 ENCOUNTER — Ambulatory Visit (INDEPENDENT_AMBULATORY_CARE_PROVIDER_SITE_OTHER): Payer: BLUE CROSS/BLUE SHIELD

## 2018-02-09 DIAGNOSIS — J309 Allergic rhinitis, unspecified: Secondary | ICD-10-CM

## 2018-02-09 MED ORDER — EPINEPHRINE 0.3 MG/0.3ML IJ SOAJ: 0.3000 mg | Freq: Once | INTRAMUSCULAR | 2 refills | Status: AC

## 2018-02-23 ENCOUNTER — Ambulatory Visit (INDEPENDENT_AMBULATORY_CARE_PROVIDER_SITE_OTHER): Payer: BLUE CROSS/BLUE SHIELD

## 2018-02-23 DIAGNOSIS — J309 Allergic rhinitis, unspecified: Secondary | ICD-10-CM | POA: Diagnosis not present

## 2018-03-02 ENCOUNTER — Telehealth: Payer: Self-pay | Admitting: *Deleted

## 2018-03-02 ENCOUNTER — Ambulatory Visit (INDEPENDENT_AMBULATORY_CARE_PROVIDER_SITE_OTHER): Payer: BLUE CROSS/BLUE SHIELD | Admitting: *Deleted

## 2018-03-02 DIAGNOSIS — J309 Allergic rhinitis, unspecified: Secondary | ICD-10-CM

## 2018-03-02 NOTE — Telephone Encounter (Signed)
I received a fax from [facility] regarding setting up delivery with the patient. I called and spoke with Isaac Guerrero and gave him the phone number for him to call and set up delivery for his Auvi-Q as a part of his Immunotherapy protocol.

## 2018-03-09 ENCOUNTER — Ambulatory Visit (INDEPENDENT_AMBULATORY_CARE_PROVIDER_SITE_OTHER): Payer: BLUE CROSS/BLUE SHIELD

## 2018-03-09 DIAGNOSIS — J309 Allergic rhinitis, unspecified: Secondary | ICD-10-CM | POA: Diagnosis not present

## 2018-03-16 ENCOUNTER — Ambulatory Visit (INDEPENDENT_AMBULATORY_CARE_PROVIDER_SITE_OTHER): Payer: BLUE CROSS/BLUE SHIELD

## 2018-03-16 DIAGNOSIS — J309 Allergic rhinitis, unspecified: Secondary | ICD-10-CM

## 2018-03-23 ENCOUNTER — Ambulatory Visit (INDEPENDENT_AMBULATORY_CARE_PROVIDER_SITE_OTHER): Payer: BLUE CROSS/BLUE SHIELD | Admitting: *Deleted

## 2018-03-23 DIAGNOSIS — J309 Allergic rhinitis, unspecified: Secondary | ICD-10-CM | POA: Diagnosis not present

## 2018-03-30 ENCOUNTER — Ambulatory Visit (INDEPENDENT_AMBULATORY_CARE_PROVIDER_SITE_OTHER): Payer: BLUE CROSS/BLUE SHIELD

## 2018-03-30 DIAGNOSIS — J309 Allergic rhinitis, unspecified: Secondary | ICD-10-CM

## 2018-04-06 ENCOUNTER — Ambulatory Visit (INDEPENDENT_AMBULATORY_CARE_PROVIDER_SITE_OTHER): Payer: BLUE CROSS/BLUE SHIELD | Admitting: *Deleted

## 2018-04-06 DIAGNOSIS — J309 Allergic rhinitis, unspecified: Secondary | ICD-10-CM

## 2018-04-07 NOTE — Progress Notes (Signed)
VIALS EXP 04-09-19

## 2018-04-09 DIAGNOSIS — J301 Allergic rhinitis due to pollen: Secondary | ICD-10-CM

## 2018-04-13 ENCOUNTER — Ambulatory Visit (INDEPENDENT_AMBULATORY_CARE_PROVIDER_SITE_OTHER): Payer: BLUE CROSS/BLUE SHIELD | Admitting: *Deleted

## 2018-04-13 DIAGNOSIS — J309 Allergic rhinitis, unspecified: Secondary | ICD-10-CM | POA: Diagnosis not present

## 2018-04-20 ENCOUNTER — Ambulatory Visit (INDEPENDENT_AMBULATORY_CARE_PROVIDER_SITE_OTHER): Payer: BLUE CROSS/BLUE SHIELD

## 2018-04-20 DIAGNOSIS — J309 Allergic rhinitis, unspecified: Secondary | ICD-10-CM

## 2018-04-27 ENCOUNTER — Ambulatory Visit (INDEPENDENT_AMBULATORY_CARE_PROVIDER_SITE_OTHER): Payer: BLUE CROSS/BLUE SHIELD | Admitting: *Deleted

## 2018-04-27 DIAGNOSIS — J309 Allergic rhinitis, unspecified: Secondary | ICD-10-CM

## 2018-05-04 ENCOUNTER — Ambulatory Visit (INDEPENDENT_AMBULATORY_CARE_PROVIDER_SITE_OTHER): Payer: BLUE CROSS/BLUE SHIELD

## 2018-05-04 DIAGNOSIS — J309 Allergic rhinitis, unspecified: Secondary | ICD-10-CM

## 2018-05-11 ENCOUNTER — Ambulatory Visit (INDEPENDENT_AMBULATORY_CARE_PROVIDER_SITE_OTHER): Payer: BLUE CROSS/BLUE SHIELD | Admitting: *Deleted

## 2018-05-11 DIAGNOSIS — J309 Allergic rhinitis, unspecified: Secondary | ICD-10-CM

## 2018-05-18 ENCOUNTER — Ambulatory Visit (INDEPENDENT_AMBULATORY_CARE_PROVIDER_SITE_OTHER): Payer: BLUE CROSS/BLUE SHIELD | Admitting: *Deleted

## 2018-05-18 DIAGNOSIS — J309 Allergic rhinitis, unspecified: Secondary | ICD-10-CM | POA: Diagnosis not present

## 2018-05-25 ENCOUNTER — Ambulatory Visit (INDEPENDENT_AMBULATORY_CARE_PROVIDER_SITE_OTHER): Payer: BLUE CROSS/BLUE SHIELD

## 2018-05-25 DIAGNOSIS — J309 Allergic rhinitis, unspecified: Secondary | ICD-10-CM | POA: Diagnosis not present

## 2018-06-08 ENCOUNTER — Ambulatory Visit (INDEPENDENT_AMBULATORY_CARE_PROVIDER_SITE_OTHER): Payer: BLUE CROSS/BLUE SHIELD

## 2018-06-08 DIAGNOSIS — J309 Allergic rhinitis, unspecified: Secondary | ICD-10-CM | POA: Diagnosis not present

## 2018-06-16 ENCOUNTER — Ambulatory Visit (INDEPENDENT_AMBULATORY_CARE_PROVIDER_SITE_OTHER): Payer: BLUE CROSS/BLUE SHIELD | Admitting: *Deleted

## 2018-06-16 DIAGNOSIS — J309 Allergic rhinitis, unspecified: Secondary | ICD-10-CM | POA: Diagnosis not present

## 2018-06-22 ENCOUNTER — Ambulatory Visit: Payer: BLUE CROSS/BLUE SHIELD | Admitting: Allergy & Immunology

## 2018-06-25 ENCOUNTER — Encounter: Payer: Self-pay | Admitting: Allergy & Immunology

## 2018-06-25 ENCOUNTER — Ambulatory Visit: Payer: BLUE CROSS/BLUE SHIELD | Admitting: Allergy & Immunology

## 2018-06-25 VITALS — BP 128/94 | HR 67 | Resp 18 | Ht 69.0 in | Wt 191.0 lb

## 2018-06-25 DIAGNOSIS — J3089 Other allergic rhinitis: Secondary | ICD-10-CM | POA: Diagnosis not present

## 2018-06-25 DIAGNOSIS — J302 Other seasonal allergic rhinitis: Secondary | ICD-10-CM | POA: Diagnosis not present

## 2018-06-25 NOTE — Patient Instructions (Addendum)
1. Seasonal and perennial allergic rhinitis - Continue with allergy shots at the same schedule. - Maintenance was reached in September 2018, so we will continue through at least September 2021 (three years) or up to September 2023.  - Continue with Benadryl as needed nightly.   2. Return in about 1 year (around 06/26/2019).   Please inform us of any Emergency Department visits, hospitalizations, or changes in symptoms. Call us before going to the ED for breathing or allergy symptoms since we might be able to fit you in for a sick visit. Feel free to contact us anytime with any questions, problems, or concerns.  It was a pleasure to see you again today!  Websites that have reliable patient information: 1. American Academy of Asthma, Allergy, and Immunology: www.aaaai.org 2. Food Allergy Research and Education (FARE): foodallergy.org 3. Mothers of Asthmatics: http://www.asthmacommunitynetwork.org 4. American College of Allergy, Asthma, and Immunology: MonthlyElectricBill.co.uk   Make sure you are registered to vote! If you have moved or changed any of your contact information, you will need to get this updated before voting!

## 2018-06-25 NOTE — Progress Notes (Signed)
FOLLOW UP  Date of Service/Encounter:  06/25/18   Assessment:   Seasonal and perennial allergic rhinitis - on allergen immunotherapy with maintenance reached September 2018  Plan/Recommendations:   1. Seasonal and perennial allergic rhinitis - Continue with allergy shots at the same schedule. - Maintenance was reached in September 2018, so we will continue through at least September 2021 (three years) or up to September 2023.  - Continue with Benadryl as needed nightly.   2. Return in about 1 year (around 06/26/2019).  Subjective:   Isaac Guerrero is a 62 y.o. male presenting today for follow up of  Chief Complaint  Patient presents with  . Allergic Rhinitis     Isaac Guerrero has a history of the following: Patient Active Problem List   Diagnosis Date Noted  . Seasonal and perennial allergic rhinitis 06/23/2017  . Non-seasonal allergic rhinitis due to fungal spores 12/23/2016  . Anemia 10/10/2013  . Knee pain 05/14/2011  . PALPITATIONS 07/21/2010  . LOW BACK PAIN, CHRONIC 03/27/2009  . HYPOTHYROIDISM 09/23/2007  . HYPERLIPIDEMIA 09/23/2007  . GERD 09/23/2007  . BENIGN PROSTATIC HYPERTROPHY 09/23/2007  . PSORIASIS 09/22/2007  . HYPERGLYCEMIA 09/22/2007    History obtained from: chart review and patient.  Isaac Guerrero's Primary Care Provider is Celene Squibb, MD.     Isaac Guerrero is a 62 y.o. male presenting for a follow up visit.  He was last seen in October 2018.  At that time, we continued him on his immunotherapy.  He has sensitizations to trees, grass, weeds, ragweed, and all molds.  We continue nasal saline rinses and Benadryl at night as needed.  Since the last visit, he has mostly done well.   Isaac Guerrero is on allergen immunotherapy. He receives two injections. Immunotherapy script #1 contains trees, weeds and grasses. He currently receives 0.41mL of the RED vial (1/100). Immunotherapy script #2 contains molds. He currently receives 0.80mL of the RED vial (1/100). He  started shots May of 2018 and reached maintenance in September of 2018. He has had no reactions whatsoever, including large local reactions.   He feels that he is doing very well on his allergy shots.  He is using Benadryl only as needed and honestly cannot remember the last time that he used it.  He does use nasal saline occasionally when he gets a stuffy nose.  This allergy season was markedly improved compared to previous allergy seasons.  He has not required any antibiotics or prednisone for his sinuses.  He will be retiring in January 2020, and he is quite excited about this.  He is planning to do nothing for a little while, although he is not very good at doing nothing and will find something else to do.  Otherwise, there have been no changes to his past medical history, surgical history, family history, or social history.    Review of Systems: a 14-point review of systems is pertinent for what is mentioned in HPI.  Otherwise, all other systems were negative.  Constitutional: negative other than that listed in the HPI Eyes: negative other than that listed in the HPI Ears, nose, mouth, throat, and face: negative other than that listed in the HPI Respiratory: negative other than that listed in the HPI Cardiovascular: negative other than that listed in the HPI Gastrointestinal: negative other than that listed in the HPI Genitourinary: negative other than that listed in the HPI Integument: negative other than that listed in the HPI Hematologic: negative other than that listed  in the HPI Musculoskeletal: negative other than that listed in the HPI Neurological: negative other than that listed in the HPI Allergy/Immunologic: negative other than that listed in the HPI    Objective:   Blood pressure (!) 128/94, pulse 67, resp. rate 18, height 5\' 9"  (1.753 m), weight 191 lb (86.6 kg), SpO2 94 %. Body mass index is 28.21 kg/m.   Physical Exam:  General: Alert, interactive, in no acute  distress. Pleasant and smiling.  Eyes: No conjunctival injection bilaterally, no discharge on the right, no discharge on the left and no Horner-Trantas dots present. PERRL bilaterally. EOMI without pain. No photophobia.  Ears: Right TM pearly gray with normal light reflex, Left TM pearly gray with normal light reflex, Right TM intact without perforation and Left TM intact without perforation.  Nose/Throat: External nose within normal limits and septum midline. Turbinates edematous without discharge. Posterior oropharynx mildly erythematous without cobblestoning in the posterior oropharynx. Tonsils 2+ without exudates.  Tongue without thrush. Lungs: Clear to auscultation without wheezing, rhonchi or rales. No increased work of breathing. CV: Normal S1/S2. No murmurs. Capillary refill <2 seconds.  Skin: Warm and dry, without lesions or rashes. Neuro:   Grossly intact. No focal deficits appreciated. Responsive to questions.  Diagnostic studies: none    Isaac Marvel, MD  [facility]

## 2018-06-30 ENCOUNTER — Ambulatory Visit (INDEPENDENT_AMBULATORY_CARE_PROVIDER_SITE_OTHER): Payer: BLUE CROSS/BLUE SHIELD | Admitting: *Deleted

## 2018-06-30 DIAGNOSIS — J309 Allergic rhinitis, unspecified: Secondary | ICD-10-CM | POA: Diagnosis not present

## 2018-07-06 DIAGNOSIS — J301 Allergic rhinitis due to pollen: Secondary | ICD-10-CM

## 2018-07-06 NOTE — Progress Notes (Signed)
VIALS EXP 09-15-18 

## 2018-07-14 ENCOUNTER — Ambulatory Visit (INDEPENDENT_AMBULATORY_CARE_PROVIDER_SITE_OTHER): Payer: BLUE CROSS/BLUE SHIELD | Admitting: *Deleted

## 2018-07-14 DIAGNOSIS — J309 Allergic rhinitis, unspecified: Secondary | ICD-10-CM

## 2018-07-28 ENCOUNTER — Ambulatory Visit (INDEPENDENT_AMBULATORY_CARE_PROVIDER_SITE_OTHER): Payer: BLUE CROSS/BLUE SHIELD

## 2018-07-28 DIAGNOSIS — J309 Allergic rhinitis, unspecified: Secondary | ICD-10-CM

## 2018-08-13 ENCOUNTER — Ambulatory Visit (INDEPENDENT_AMBULATORY_CARE_PROVIDER_SITE_OTHER): Payer: BLUE CROSS/BLUE SHIELD

## 2018-08-13 DIAGNOSIS — J309 Allergic rhinitis, unspecified: Secondary | ICD-10-CM

## 2018-09-03 ENCOUNTER — Ambulatory Visit (INDEPENDENT_AMBULATORY_CARE_PROVIDER_SITE_OTHER): Payer: BLUE CROSS/BLUE SHIELD

## 2018-09-03 DIAGNOSIS — J309 Allergic rhinitis, unspecified: Secondary | ICD-10-CM

## 2018-09-15 ENCOUNTER — Ambulatory Visit (INDEPENDENT_AMBULATORY_CARE_PROVIDER_SITE_OTHER): Payer: BLUE CROSS/BLUE SHIELD

## 2018-09-15 DIAGNOSIS — J309 Allergic rhinitis, unspecified: Secondary | ICD-10-CM

## 2018-09-22 ENCOUNTER — Ambulatory Visit (INDEPENDENT_AMBULATORY_CARE_PROVIDER_SITE_OTHER): Payer: BLUE CROSS/BLUE SHIELD | Admitting: *Deleted

## 2018-09-22 DIAGNOSIS — J309 Allergic rhinitis, unspecified: Secondary | ICD-10-CM | POA: Diagnosis not present

## 2018-09-29 ENCOUNTER — Ambulatory Visit (INDEPENDENT_AMBULATORY_CARE_PROVIDER_SITE_OTHER): Payer: BLUE CROSS/BLUE SHIELD | Admitting: *Deleted

## 2018-09-29 DIAGNOSIS — J309 Allergic rhinitis, unspecified: Secondary | ICD-10-CM

## 2018-10-06 ENCOUNTER — Ambulatory Visit (INDEPENDENT_AMBULATORY_CARE_PROVIDER_SITE_OTHER): Payer: BLUE CROSS/BLUE SHIELD

## 2018-10-06 DIAGNOSIS — J309 Allergic rhinitis, unspecified: Secondary | ICD-10-CM

## 2018-10-13 ENCOUNTER — Ambulatory Visit (INDEPENDENT_AMBULATORY_CARE_PROVIDER_SITE_OTHER): Payer: BLUE CROSS/BLUE SHIELD

## 2018-10-13 DIAGNOSIS — J309 Allergic rhinitis, unspecified: Secondary | ICD-10-CM | POA: Diagnosis not present

## 2018-10-27 ENCOUNTER — Ambulatory Visit (INDEPENDENT_AMBULATORY_CARE_PROVIDER_SITE_OTHER): Payer: BLUE CROSS/BLUE SHIELD | Admitting: *Deleted

## 2018-10-27 DIAGNOSIS — J309 Allergic rhinitis, unspecified: Secondary | ICD-10-CM | POA: Diagnosis not present

## 2018-11-10 ENCOUNTER — Ambulatory Visit (INDEPENDENT_AMBULATORY_CARE_PROVIDER_SITE_OTHER): Payer: BLUE CROSS/BLUE SHIELD

## 2018-11-10 DIAGNOSIS — J309 Allergic rhinitis, unspecified: Secondary | ICD-10-CM | POA: Diagnosis not present

## 2018-11-12 DIAGNOSIS — S76311A Strain of muscle, fascia and tendon of the posterior muscle group at thigh level, right thigh, initial encounter: Secondary | ICD-10-CM | POA: Diagnosis not present

## 2018-11-17 DIAGNOSIS — J301 Allergic rhinitis due to pollen: Secondary | ICD-10-CM | POA: Diagnosis not present

## 2018-11-17 NOTE — Progress Notes (Signed)
VIALS EXP 11-17-2019

## 2018-11-18 DIAGNOSIS — J3089 Other allergic rhinitis: Secondary | ICD-10-CM | POA: Diagnosis not present

## 2018-11-24 ENCOUNTER — Ambulatory Visit (INDEPENDENT_AMBULATORY_CARE_PROVIDER_SITE_OTHER): Payer: Medicare Other | Admitting: *Deleted

## 2018-11-24 DIAGNOSIS — J309 Allergic rhinitis, unspecified: Secondary | ICD-10-CM

## 2018-12-15 ENCOUNTER — Ambulatory Visit (INDEPENDENT_AMBULATORY_CARE_PROVIDER_SITE_OTHER): Payer: Medicare Other | Admitting: *Deleted

## 2018-12-15 DIAGNOSIS — J309 Allergic rhinitis, unspecified: Secondary | ICD-10-CM

## 2018-12-30 DIAGNOSIS — B370 Candidal stomatitis: Secondary | ICD-10-CM | POA: Diagnosis not present

## 2018-12-31 ENCOUNTER — Telehealth: Payer: Self-pay

## 2018-12-31 NOTE — Telephone Encounter (Signed)
Patient called stating his insurance will no longer cover allergy shots. He is requesting to stop shots.  Please Advise

## 2019-01-04 NOTE — Telephone Encounter (Signed)
Isaac Guerrero I didn't know if you needed to know this also.

## 2019-01-04 NOTE — Telephone Encounter (Signed)
Called and spoke with patient.  Isaac Guerrero states he does want to stop allergy injections at this time due to insurance.  Patient is aware that he has a new Red vial that was made 11/17/2018 and will not expire until 11/17/2019.  This vial will be kept if patient does decide to restart injections in the future.   Informed patient he needs to sign discontinuing injection form and he will come to the [facility] office to sign when it is convenient for him.   Discontinuing injections noted in immunotherapy flow sheet.  Dr. Ernst Bowler notified.

## 2019-01-05 NOTE — Telephone Encounter (Signed)
Patient will come to office to sign discontinuing immunotherapy form.

## 2019-01-05 NOTE — Telephone Encounter (Signed)
That is totally fine with me. Does he have to sign some paperwork?   Salvatore Marvel, MD [facility]

## 2019-01-06 DIAGNOSIS — J060 Acute laryngopharyngitis: Secondary | ICD-10-CM | POA: Diagnosis not present

## 2019-01-06 DIAGNOSIS — Z6826 Body mass index (BMI) 26.0-26.9, adult: Secondary | ICD-10-CM | POA: Diagnosis not present

## 2019-01-06 DIAGNOSIS — I10 Essential (primary) hypertension: Secondary | ICD-10-CM | POA: Diagnosis not present

## 2019-01-06 DIAGNOSIS — Z6827 Body mass index (BMI) 27.0-27.9, adult: Secondary | ICD-10-CM | POA: Diagnosis not present

## 2019-01-12 DIAGNOSIS — J309 Allergic rhinitis, unspecified: Secondary | ICD-10-CM | POA: Diagnosis not present

## 2019-01-12 DIAGNOSIS — N182 Chronic kidney disease, stage 2 (mild): Secondary | ICD-10-CM | POA: Diagnosis not present

## 2019-01-12 DIAGNOSIS — K219 Gastro-esophageal reflux disease without esophagitis: Secondary | ICD-10-CM | POA: Diagnosis not present

## 2019-01-12 DIAGNOSIS — R945 Abnormal results of liver function studies: Secondary | ICD-10-CM | POA: Diagnosis not present

## 2019-02-09 DIAGNOSIS — Z0120 Encounter for dental examination and cleaning without abnormal findings: Secondary | ICD-10-CM | POA: Diagnosis not present

## 2019-04-25 DIAGNOSIS — M79604 Pain in right leg: Secondary | ICD-10-CM | POA: Diagnosis not present

## 2019-04-25 DIAGNOSIS — B370 Candidal stomatitis: Secondary | ICD-10-CM | POA: Diagnosis not present

## 2019-06-29 ENCOUNTER — Ambulatory Visit: Payer: BLUE CROSS/BLUE SHIELD | Admitting: Allergy & Immunology

## 2019-07-04 DIAGNOSIS — Z23 Encounter for immunization: Secondary | ICD-10-CM | POA: Diagnosis not present

## 2019-07-11 DIAGNOSIS — E782 Mixed hyperlipidemia: Secondary | ICD-10-CM | POA: Diagnosis not present

## 2019-07-11 DIAGNOSIS — R7303 Prediabetes: Secondary | ICD-10-CM | POA: Diagnosis not present

## 2019-07-11 DIAGNOSIS — I10 Essential (primary) hypertension: Secondary | ICD-10-CM | POA: Diagnosis not present

## 2019-07-11 DIAGNOSIS — R7301 Impaired fasting glucose: Secondary | ICD-10-CM | POA: Diagnosis not present

## 2019-07-18 DIAGNOSIS — R945 Abnormal results of liver function studies: Secondary | ICD-10-CM | POA: Diagnosis not present

## 2019-07-18 DIAGNOSIS — K219 Gastro-esophageal reflux disease without esophagitis: Secondary | ICD-10-CM | POA: Diagnosis not present

## 2019-07-18 DIAGNOSIS — N182 Chronic kidney disease, stage 2 (mild): Secondary | ICD-10-CM | POA: Diagnosis not present

## 2019-07-18 DIAGNOSIS — R309 Painful micturition, unspecified: Secondary | ICD-10-CM | POA: Diagnosis not present

## 2019-09-15 ENCOUNTER — Other Ambulatory Visit: Payer: Self-pay

## 2019-09-15 ENCOUNTER — Ambulatory Visit: Payer: Medicare Other | Attending: Internal Medicine

## 2019-09-15 DIAGNOSIS — Z20822 Contact with and (suspected) exposure to covid-19: Secondary | ICD-10-CM

## 2019-09-16 LAB — NOVEL CORONAVIRUS, NAA: SARS-CoV-2, NAA: NOT DETECTED

## 2019-09-20 DIAGNOSIS — Z0120 Encounter for dental examination and cleaning without abnormal findings: Secondary | ICD-10-CM | POA: Diagnosis not present

## 2019-10-24 DIAGNOSIS — Z6826 Body mass index (BMI) 26.0-26.9, adult: Secondary | ICD-10-CM | POA: Diagnosis not present

## 2019-10-24 DIAGNOSIS — J060 Acute laryngopharyngitis: Secondary | ICD-10-CM | POA: Diagnosis not present

## 2019-10-24 DIAGNOSIS — Z6827 Body mass index (BMI) 27.0-27.9, adult: Secondary | ICD-10-CM | POA: Diagnosis not present

## 2019-10-24 DIAGNOSIS — I10 Essential (primary) hypertension: Secondary | ICD-10-CM | POA: Diagnosis not present

## 2019-10-25 DIAGNOSIS — N182 Chronic kidney disease, stage 2 (mild): Secondary | ICD-10-CM | POA: Diagnosis not present

## 2019-10-25 DIAGNOSIS — K219 Gastro-esophageal reflux disease without esophagitis: Secondary | ICD-10-CM | POA: Diagnosis not present

## 2019-10-25 DIAGNOSIS — R309 Painful micturition, unspecified: Secondary | ICD-10-CM | POA: Diagnosis not present

## 2019-10-25 DIAGNOSIS — R945 Abnormal results of liver function studies: Secondary | ICD-10-CM | POA: Diagnosis not present

## 2019-12-01 DIAGNOSIS — N401 Enlarged prostate with lower urinary tract symptoms: Secondary | ICD-10-CM | POA: Diagnosis not present

## 2019-12-01 DIAGNOSIS — R350 Frequency of micturition: Secondary | ICD-10-CM | POA: Diagnosis not present

## 2019-12-01 DIAGNOSIS — R3915 Urgency of urination: Secondary | ICD-10-CM | POA: Diagnosis not present

## 2019-12-01 DIAGNOSIS — R300 Dysuria: Secondary | ICD-10-CM | POA: Diagnosis not present

## 2020-01-04 DIAGNOSIS — R3121 Asymptomatic microscopic hematuria: Secondary | ICD-10-CM | POA: Diagnosis not present

## 2020-01-04 DIAGNOSIS — R350 Frequency of micturition: Secondary | ICD-10-CM | POA: Diagnosis not present

## 2020-01-04 DIAGNOSIS — R3915 Urgency of urination: Secondary | ICD-10-CM | POA: Diagnosis not present

## 2020-01-04 DIAGNOSIS — N401 Enlarged prostate with lower urinary tract symptoms: Secondary | ICD-10-CM | POA: Diagnosis not present

## 2020-01-16 DIAGNOSIS — R3915 Urgency of urination: Secondary | ICD-10-CM | POA: Diagnosis not present

## 2020-01-16 DIAGNOSIS — R3129 Other microscopic hematuria: Secondary | ICD-10-CM | POA: Diagnosis not present

## 2020-01-16 DIAGNOSIS — D090 Carcinoma in situ of bladder: Secondary | ICD-10-CM | POA: Diagnosis not present

## 2020-01-16 DIAGNOSIS — N401 Enlarged prostate with lower urinary tract symptoms: Secondary | ICD-10-CM | POA: Diagnosis not present

## 2020-01-18 ENCOUNTER — Other Ambulatory Visit: Payer: Self-pay | Admitting: Urology

## 2020-01-25 ENCOUNTER — Other Ambulatory Visit (HOSPITAL_COMMUNITY): Payer: Self-pay | Admitting: Internal Medicine

## 2020-01-25 ENCOUNTER — Other Ambulatory Visit: Payer: Self-pay | Admitting: Internal Medicine

## 2020-01-25 DIAGNOSIS — R911 Solitary pulmonary nodule: Secondary | ICD-10-CM

## 2020-02-01 ENCOUNTER — Encounter (HOSPITAL_BASED_OUTPATIENT_CLINIC_OR_DEPARTMENT_OTHER): Payer: Self-pay | Admitting: Urology

## 2020-02-01 ENCOUNTER — Other Ambulatory Visit: Payer: Self-pay

## 2020-02-01 NOTE — Progress Notes (Signed)
Spoke w/ via phone for pre-op interview---patient Lab needs dos----    I stat 8, ekg         COVID test ------02-03-2020 at 250 pm Arrive at -------700 am 02-07-2020 NPO after ------midnight Medications to take morning of surgery -----levothyroxine, pantaprazole, gabapentin Diabetic medication -----n/a Patient Special Instructions -----none Pre-Op special Istructions -----none Patient verbalized understanding of instructions that were given at this phone interview. Patient denies shortness of breath, chest pain, fever, cough a this phone interview.

## 2020-02-03 ENCOUNTER — Other Ambulatory Visit (HOSPITAL_COMMUNITY)
Admission: RE | Admit: 2020-02-03 | Discharge: 2020-02-03 | Disposition: A | Discharge status: Home / Self Care | Payer: Medicare Other | Source: Ambulatory Visit | Attending: Urology | Admitting: Urology

## 2020-02-03 DIAGNOSIS — Z01812 Encounter for preprocedural laboratory examination: Secondary | ICD-10-CM | POA: Diagnosis not present

## 2020-02-03 DIAGNOSIS — Z20822 Contact with and (suspected) exposure to covid-19: Secondary | ICD-10-CM | POA: Diagnosis not present

## 2020-02-04 LAB — SARS CORONAVIRUS 2 (TAT 6-24 HRS): SARS Coronavirus 2: NEGATIVE

## 2020-02-06 NOTE — H&P (Signed)
01/16/20: Isaac Guerrero returns today in f/u. His cytology is positive. A CT today showed bilateral renal cysts and prostate enlargement but no bladder lesions or upper tract lesions. His IPSS remains elevated at 24.   01/04/20: Cap returns today in f/u for his LUTS with O[facility]. He was given a trial of Vesicare on 12/01/19 but didin't tolerate it or notice improvement . His IPSS is 25 with frequency and a sensation of incomplete emptying as primary issues. he has nocturia x 4 and urgency. He has some chronic dysuria and Azo doesn't help. He had no gross hematuria but he has 3-10 RBC's today.   12/01/2019: Seen in the past for ED and balanitis by Dr Jeffie Pollock. He is on Androgel for hypogonadism managed by his primary care physician. Presents today complaining of worsening frequency/urgency and nocturia that has persistently increased since the fall of last year. His PCP has previously trialed him on tamsulosin, Toviaz, and Myrbetriq with patient reporting no notable difference is in voiding symptoms with either of the medications. Not associated with any hesitancy or straining. He tells me tries to drink a lot of water but that seems to only make symptoms worse. Also associated with chronic intermittent burning with urination. He does not have any associated lower back or flank pain/discomfort. He denies any visible blood in the urine. Not associated with fevers or chills, nausea/vomiting.     ALLERGIES: Keflex CAPS Penicillins Sulfa Drugs    MEDICATIONS: Androgel  Levothyroxine Sodium 88 mcg tablet  Simvastatin 40 mg tablet  Gabapentin 100 mg capsule  Losartan Potassium 50 mg tablet  Meloxicam 15 mg tablet  Pantoprazole Sodium 40 mg tablet, delayed release  Sildenafil Citrate 20 mg tablet 1 tablet PO PRN Pt. to take 1-5 tabs prn prior to sexual activity  Tramadol Hcl 50 mg tablet     GU PSH: None     PSH Notes: Back Surgery   NON-GU PSH: Back surgery     GU PMH: Microscopic hematuria, 3-10 RBC's per HPF.  - 01/04/2020 BPH w/LUTS - 12/01/2019 Dysuria - 12/01/2019 Urinary Frequency - 12/01/2019 Urinary Urgency - 12/01/2019 ED due to arterial insufficiency, He is managing well on the sildenafil. He will f/u prn. - 2018, He has moderate ED with right curve. He had headaches with Viagra. I discussed the other PDE5's and he wants to try low dose sildenafil and I recommended he take tylenol or advil with it to try to reduce the headaches. , - 2018 Balanitis, He has a right glanular lesion that is of uncertain etiology. I am going to have him use mycolog cream and f/u in 6 weeks. If he is no better he will need a biopsy. - 2018 Peyronies Disease, He has a mild rightward non-painful curve that doesn't impact intercourse. - 2018 Spermatocele of epididymis, Unspec, Spermatocele - 2014 Testicular pain, unspecified, Testicular pain - 2014      PMH Notes:  1898-09-01 00:00:00 - Note: Normal Routine History And Physical Adult   NON-GU PMH: Personal history of other diseases of the digestive system, History of esophageal reflux - 2014 Personal history of other endocrine, nutritional and metabolic disease, History of hypothyroidism - 2014, History of hypercholesterolemia, - 2014 Ventricular premature depolarization, Premature Ventricular Contractions - 2014 GERD Hypercholesterolemia Hyperthyroidism    FAMILY HISTORY: copd - Mother nephrolithiasis - Father   SOCIAL HISTORY: Marital Status: Married Preferred Language: English; Race: White Current Smoking Status: Patient has never smoked.   Tobacco Use Assessment Completed: Used Tobacco in last 30 days?  Patient's occupation Therapist, nutritional.     Notes: 1 daughter    REVIEW OF SYSTEMS:    GU Review Male:   Patient reports frequent urination, hard to postpone urination, burning/ pain with urination, get up at night to urinate, leakage of urine, stream starts and stops, and erection problems. Patient denies trouble starting your stream, have to strain to  urinate , and penile pain.  Gastrointestinal (Upper):   Patient denies indigestion/ heartburn, vomiting, and nausea.  Gastrointestinal (Lower):   Patient denies diarrhea and constipation.  Constitutional:   Patient denies fever, night sweats, weight loss, and fatigue.  Skin:   Patient denies skin rash/ lesion and itching.  Eyes:   Patient denies blurred vision and double vision.  Ears/ Nose/ Throat:   Patient denies sore throat and sinus problems.  Hematologic/Lymphatic:   Patient denies swollen glands and easy bruising.  Cardiovascular:   Patient denies leg swelling and chest pains.  Respiratory:   Patient denies cough and shortness of breath.  Endocrine:   Patient denies excessive thirst.  Musculoskeletal:   Patient reports back pain. Patient denies joint pain.  Neurological:   Patient denies headaches and dizziness.  Psychologic:   Patient denies depression and anxiety.   VITAL SIGNS:      01/16/2020 08:52 AM  Weight 185 lb / 83.91 kg  Height 70 in / 177.8 cm  BP 143/90 mmHg  Heart Rate 78 /min  Temperature 97.7 F / 36.5 C  BMI 26.5 kg/m   Complexity of Data:  Lab Test Review:   Urine Cytology  Records Review:   AUA Symptom Score, Previous Patient Records  X-Ray Review: C.T. Hematuria: Reviewed Films. Reviewed Report. Discussed With Patient.     PROCEDURES:         Flexible Cystoscopy - 52000  Risks, benefits, and some of the potential complications of the procedure were discussed. 26ml of 2% lidocaine jelly was instilled intraurethrally.  Cipro 500mg  given for antibiotic prophylaxis.     Meatus:  Normal size. Normal location. Normal condition.  Urethra:  No strictures.  External Sphincter:  Normal.  Verumontanum:  Normal.  Prostate:  Obstructing. Moderate hyperplasia. friable mucosa  Bladder Neck:  Non-obstructing.  Ureteral Orifices:  Normal location. Normal size. Normal shape. Effluxed clear urine.  Bladder:  Mild trabeculation. Erythematous mucosa worrisome for CIS  on the left lateral wall in 2 locations. No tumors. No stones.      The procedure was well tolerated and there were no complications.          C.T. Hematuria - 74178, P7106      OMNIPAQUE 300 125CC Patient confirmed No Neulasta OnPro Device.   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 I will get him set up for a bladder biopsy with fulguration and instillation of gemcitabine. I have reviewed the risks of bleeding, infection, bladder wall, ureteral and urethral injury, chemical cystitis, thrombotic events and anesthetic complications.   2   Urinary Urgency - R39.15 Chronic, Stable - He has BPH with BOO but the urgency is probably from the CIS.   3   BPH w/LUTS - N40.1      PLAN:           Schedule Return Visit/Planned Activity: ASAP - Schedule Surgery  Note: Cystoscopy with bladder biopsy and fulguration.           Document Letter(s):  Created for Patient: Clinical Summary         Notes:   CC: Dr. Wende Neighbors.

## 2020-02-07 ENCOUNTER — Ambulatory Visit (HOSPITAL_BASED_OUTPATIENT_CLINIC_OR_DEPARTMENT_OTHER): Payer: Medicare Other | Admitting: Anesthesiology

## 2020-02-07 ENCOUNTER — Encounter (HOSPITAL_BASED_OUTPATIENT_CLINIC_OR_DEPARTMENT_OTHER)
Admission: RE | Disposition: A | Discharge status: Home / Self Care | Payer: Self-pay | Source: Home / Self Care | Attending: Urology

## 2020-02-07 ENCOUNTER — Encounter (HOSPITAL_BASED_OUTPATIENT_CLINIC_OR_DEPARTMENT_OTHER): Payer: Self-pay | Admitting: Urology

## 2020-02-07 ENCOUNTER — Other Ambulatory Visit: Payer: Self-pay

## 2020-02-07 ENCOUNTER — Ambulatory Visit (HOSPITAL_BASED_OUTPATIENT_CLINIC_OR_DEPARTMENT_OTHER)
Admission: RE | Admit: 2020-02-07 | Discharge: 2020-02-07 | Disposition: A | Discharge status: Home / Self Care | Payer: Medicare Other | Attending: Urology | Admitting: Urology

## 2020-02-07 DIAGNOSIS — E039 Hypothyroidism, unspecified: Secondary | ICD-10-CM | POA: Diagnosis not present

## 2020-02-07 DIAGNOSIS — I10 Essential (primary) hypertension: Secondary | ICD-10-CM | POA: Diagnosis not present

## 2020-02-07 DIAGNOSIS — D090 Carcinoma in situ of bladder: Secondary | ICD-10-CM | POA: Insufficient documentation

## 2020-02-07 DIAGNOSIS — Z79890 Hormone replacement therapy (postmenopausal): Secondary | ICD-10-CM | POA: Insufficient documentation

## 2020-02-07 DIAGNOSIS — N329 Bladder disorder, unspecified: Secondary | ICD-10-CM | POA: Diagnosis not present

## 2020-02-07 DIAGNOSIS — D413 Neoplasm of uncertain behavior of urethra: Secondary | ICD-10-CM | POA: Diagnosis not present

## 2020-02-07 DIAGNOSIS — K219 Gastro-esophageal reflux disease without esophagitis: Secondary | ICD-10-CM | POA: Insufficient documentation

## 2020-02-07 DIAGNOSIS — Z79899 Other long term (current) drug therapy: Secondary | ICD-10-CM | POA: Insufficient documentation

## 2020-02-07 HISTORY — DX: Bladder disorder, unspecified: N32.9

## 2020-02-07 HISTORY — PX: CYSTOSCOPY WITH BIOPSY: SHX5122

## 2020-02-07 HISTORY — DX: Essential (primary) hypertension: I10

## 2020-02-07 HISTORY — DX: Unspecified osteoarthritis, unspecified site: M19.90

## 2020-02-07 LAB — POCT I-STAT, CHEM 8
BUN: 26 mg/dL — ABNORMAL HIGH (ref 8–23)
Calcium, Ion: 1.35 mmol/L (ref 1.15–1.40)
Chloride: 104 mmol/L (ref 98–111)
Creatinine, Ser: 1.1 mg/dL (ref 0.61–1.24)
Glucose, Bld: 144 mg/dL — ABNORMAL HIGH (ref 70–99)
HCT: 51 % (ref 39.0–52.0)
Hemoglobin: 17.3 g/dL — ABNORMAL HIGH (ref 13.0–17.0)
Potassium: 3.9 mmol/L (ref 3.5–5.1)
Sodium: 142 mmol/L (ref 135–145)
TCO2: 23 mmol/L (ref 22–32)

## 2020-02-07 SURGERY — CYSTOSCOPY, WITH BIOPSY
Anesthesia: General

## 2020-02-07 MED ORDER — FENTANYL CITRATE (PF) 100 MCG/2ML IJ SOLN
INTRAMUSCULAR | Status: AC
  Filled 2020-02-07: qty 2

## 2020-02-07 MED ORDER — PROPOFOL 10 MG/ML IV BOLUS
INTRAVENOUS | Status: AC
  Filled 2020-02-07: qty 20

## 2020-02-07 MED ORDER — ACETAMINOPHEN 325 MG PO TABS: 650.0000 mg | ORAL_TABLET | ORAL | Status: DC | PRN

## 2020-02-07 MED ORDER — MORPHINE SULFATE (PF) 4 MG/ML IV SOLN: 2.0000 mg | INTRAVENOUS | Status: DC | PRN

## 2020-02-07 MED ORDER — SODIUM CHLORIDE 0.9% FLUSH: 3.0000 mL | INTRAVENOUS | Status: DC | PRN

## 2020-02-07 MED ORDER — OXYCODONE HCL 5 MG PO TABS
ORAL_TABLET | ORAL | Status: AC
  Filled 2020-02-07: qty 1

## 2020-02-07 MED ORDER — SODIUM CHLORIDE 0.9% FLUSH: 3.0000 mL | Freq: Two times a day (BID) | INTRAVENOUS | Status: DC

## 2020-02-07 MED ORDER — ONDANSETRON HCL 4 MG/2ML IJ SOLN
INTRAMUSCULAR | Status: DC | PRN
  Administered 2020-02-07: 4 mg via INTRAVENOUS

## 2020-02-07 MED ORDER — CIPROFLOXACIN IN D5W 400 MG/200ML IV SOLN
INTRAVENOUS | Status: AC
  Filled 2020-02-07: qty 200

## 2020-02-07 MED ORDER — FENTANYL CITRATE (PF) 100 MCG/2ML IJ SOLN
INTRAMUSCULAR | Status: DC | PRN
  Administered 2020-02-07: 50 ug via INTRAVENOUS
  Administered 2020-02-07 (×2): 25 ug via INTRAVENOUS
  Administered 2020-02-07: 50 ug via INTRAVENOUS
  Administered 2020-02-07 (×2): 25 ug via INTRAVENOUS

## 2020-02-07 MED ORDER — SODIUM CHLORIDE 0.9 % IV SOLN: 250.0000 mL | INTRAVENOUS | Status: DC | PRN

## 2020-02-07 MED ORDER — GEMCITABINE CHEMO FOR BLADDER INSTILLATION 2000 MG
2000.0000 mg | Freq: Once | INTRAVENOUS | Status: AC
  Administered 2020-02-07: 2000 mg via INTRAVESICAL
  Filled 2020-02-07: qty 2000

## 2020-02-07 MED ORDER — OXYCODONE HCL 5 MG/5ML PO SOLN: 5.0000 mg | Freq: Once | ORAL | Status: AC | PRN

## 2020-02-07 MED ORDER — KETOROLAC TROMETHAMINE 30 MG/ML IJ SOLN: 30.0000 mg | Freq: Once | INTRAMUSCULAR | Status: DC | PRN

## 2020-02-07 MED ORDER — DEXAMETHASONE SODIUM PHOSPHATE 10 MG/ML IJ SOLN
INTRAMUSCULAR | Status: AC
  Filled 2020-02-07: qty 1

## 2020-02-07 MED ORDER — ACETAMINOPHEN 325 MG RE SUPP: 650.0000 mg | RECTAL | Status: DC | PRN

## 2020-02-07 MED ORDER — OXYCODONE HCL 5 MG PO TABS: 5.0000 mg | ORAL_TABLET | ORAL | Status: DC | PRN

## 2020-02-07 MED ORDER — LACTATED RINGERS IV SOLN
INTRAVENOUS | Status: DC
  Administered 2020-02-07: 1000 mL via INTRAVENOUS

## 2020-02-07 MED ORDER — CIPROFLOXACIN IN D5W 400 MG/200ML IV SOLN
400.0000 mg | INTRAVENOUS | Status: AC
  Administered 2020-02-07: 400 mg via INTRAVENOUS

## 2020-02-07 MED ORDER — LIDOCAINE 2% (20 MG/ML) 5 ML SYRINGE
INTRAMUSCULAR | Status: AC
  Filled 2020-02-07: qty 5

## 2020-02-07 MED ORDER — ONDANSETRON HCL 4 MG/2ML IJ SOLN
INTRAMUSCULAR | Status: AC
  Filled 2020-02-07: qty 2

## 2020-02-07 MED ORDER — DEXAMETHASONE SODIUM PHOSPHATE 4 MG/ML IJ SOLN
INTRAMUSCULAR | Status: DC | PRN
  Administered 2020-02-07: 10 mg via INTRAVENOUS

## 2020-02-07 MED ORDER — OXYCODONE HCL 5 MG PO TABS
5.0000 mg | ORAL_TABLET | Freq: Once | ORAL | Status: AC | PRN
  Administered 2020-02-07: 5 mg via ORAL

## 2020-02-07 MED ORDER — LIDOCAINE 2% (20 MG/ML) 5 ML SYRINGE
INTRAMUSCULAR | Status: DC | PRN
  Administered 2020-02-07: 80 mg via INTRAVENOUS

## 2020-02-07 MED ORDER — FENTANYL CITRATE (PF) 100 MCG/2ML IJ SOLN
25.0000 ug | INTRAMUSCULAR | Status: DC | PRN
  Administered 2020-02-07 (×2): 50 ug via INTRAVENOUS

## 2020-02-07 MED ORDER — PROPOFOL 10 MG/ML IV BOLUS
INTRAVENOUS | Status: DC | PRN
  Administered 2020-02-07: 200 mg via INTRAVENOUS
  Administered 2020-02-07 (×2): 50 mg via INTRAVENOUS

## 2020-02-07 SURGICAL SUPPLY — 26 items
BAG DRAIN CYSTO-URO STER (DRAIN) ×1 IMPLANT
BAG DRAIN URO-CYSTO SKYTR STRL (DRAIN) ×2 IMPLANT
BAG DRN UROCATH (DRAIN) ×1
CATH FOLEY 2WAY SLVR  5CC 16FR (CATHETERS)
CATH FOLEY 2WAY SLVR  5CC 20FR (CATHETERS) ×2
CATH FOLEY 2WAY SLVR 5CC 16FR (CATHETERS) IMPLANT
CATH FOLEY 2WAY SLVR 5CC 20FR (CATHETERS) IMPLANT
CLOTH BEACON ORANGE TIMEOUT ST (SAFETY) ×2 IMPLANT
ELECT REM PT RETURN 9FT ADLT (ELECTROSURGICAL) ×2
ELECTRODE REM PT RTRN 9FT ADLT (ELECTROSURGICAL) ×1 IMPLANT
GLOVE SURG SS PI 8.0 STRL IVOR (GLOVE) ×2 IMPLANT
GOWN STRL REUS W/ TWL LRG LVL3 (GOWN DISPOSABLE) ×1 IMPLANT
GOWN STRL REUS W/ TWL XL LVL3 (GOWN DISPOSABLE) ×1 IMPLANT
GOWN STRL REUS W/TWL LRG LVL3 (GOWN DISPOSABLE) ×2
GOWN STRL REUS W/TWL XL LVL3 (GOWN DISPOSABLE) ×2
KIT TURNOVER CYSTO (KITS) ×2 IMPLANT
LOOP CUT BIPOLAR 24F LRG (ELECTROSURGICAL) ×1 IMPLANT
MANIFOLD NEPTUNE II (INSTRUMENTS) ×2 IMPLANT
NDL SAFETY ECLIPSE 18X1.5 (NEEDLE) IMPLANT
NEEDLE HYPO 18GX1.5 SHARP (NEEDLE)
NEEDLE HYPO 22GX1.5 SAFETY (NEEDLE) IMPLANT
NS IRRIG 500ML POUR BTL (IV SOLUTION) IMPLANT
SYR 20ML LL LF (SYRINGE) IMPLANT
TRAY CYSTO PACK (CUSTOM PROCEDURE TRAY) ×2 IMPLANT
TUBE CONNECTING 12X1/4 (SUCTIONS) ×2 IMPLANT
WATER STERILE IRR 3000ML UROMA (IV SOLUTION) ×2 IMPLANT

## 2020-02-07 NOTE — Op Note (Signed)
Procedure: 1.  Cystoscopy with transurethral resection and fulguration of a large left lateral wall bladder tumor/CIS. 2.  Transurethral resection and fulguration of a medium right lateral wall bladder tumor. 3.  TUR biopsy of prostatic urethra. 4.  Instillation of gemcitabine in PACU.  Preop diagnosis: Bladder lesion on the left lateral wall consistent with carcinoma in situ with associated positive cytology.  Postop diagnosis: Same with smaller lesion on the right lateral wall above the trigone.  Surgeon: Dr. Irine Seal.  Anesthesia: General.  Specimen: Cup biopsies and TUR biopsies from the bladder wall.  TUR biopsies from the prostate.  Drain: 96 [place] Foley catheter.  EBL: 5 mL.  Complications: None.  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  It was felt that cystoscopy with biopsy, fulguration and prostatic urethral biopsy were indicated as well with instillation of gemcitabine.  Procedure: He was given Cipro.  A general anesthetic was induced.  He was placed in lithotomy position and fitted with PAS hose.  His perineum and genitalia were prepped with Betadine solution he was draped in usual sterile fashion.  Cystoscopy was performed with a 23 [place] scope and 30 degree lens.  Examination revealed a normal urethra.  The external sphincter was intact.  The prostatic urethra was short with bilobar hyperplasia with mild coaptation.  Examination of bladder wall demonstrated mild trabeculation.  There were some mild erythema slightly velvety mucosa and a large area involving the left lateral wall greater than 5 cm in diameter.  There is a small area just superior to the right ureteral orifice on the right lateral wall.  There was also some changes on the dome.  No papillary tumors were seen.  The ureteral orifices  were unremarkable.  As the bladder was filled and emptied he began to bleed from these erythematous areas making visualization somewhat difficult.  A cup biopsy forceps was used to obtain 2 biopsies from the left lateral wall but because of poor visualization and the bulky biopsy forceps I felt it was best to transition to resectoscope.  Urethra was calibrated to 30 [place] with [employer] sounds and a 28 French continuous-flow resectoscope sheath was passed.  The resectoscope was fitted with the [facility] handle with a bipolar loop and a 30 degree lens.  Saline was used as the irrigant.  The prior biopsy sites were fulgurated and hemostasis was achieved.  I then resected the lesion superior to the right ureteral orifice and fulgurated the bed and surrounding mucosa.  I then resected a portion of the left lateral wall lesion however it was too extensive to resect in its entirety.  I then attempted to fulgurate as much of the abnormal mucosa as I could with an area greater than 5 cm being involved.  Some additional erythematous mucosa on the dome was then fulgurated as well.  I did not feel that I had been able to definitely destroy all of the abnormal mucosa.  The bladder was evacuated free of the chips and hemostasis within the bladder was secured.  I then took a single TUR biopsy through the right lobe of the prostate.  The specimen was retrieved and the biopsy site was generously fulgurated.  Final inspection revealed no active bleeding and no retained tissue.  The resectoscope was removed and a 20 French Foley catheter was inserted.  The balloon was  filled with 10 mL of sterile fluid.  Patient was taken down from lithotomy position, his anesthetic was reversed and he was moved recovery in stable condition.  In the recovery room his bladder was instilled with 2000 mg of gemcitabine and 50 mL of diluent.  This was left indwelling for 1 hour and then the bladder was drained and the catheter was removed.   There were no complications.

## 2020-02-07 NOTE — Progress Notes (Signed)
After cath removal, pt voided 100 cc's bloody urine w 1 large clot and and 2 medium size clots. Pt's gown and socks changed. Pressure held to penis w cool cloth, then gauze, peripad and mesh pants applied.  Pt  Informed that he needed to drink, refill his bladder and void again.  Pt verbalized his understanding.

## 2020-02-07 NOTE — Anesthesia Postprocedure Evaluation (Signed)
Anesthesia Post Note  Patient: Tina Griffiths  Procedure(s) Performed: CYSTOSCOPY WITH BLADDER AND PROSTATIS URETHRAL  BIOPSY WIHT FULGURATION AND INSTILL GEMCITABINE (N/A )     Patient location during evaluation: PACU Anesthesia Type: General Level of consciousness: awake and alert Pain management: pain level controlled Vital Signs Assessment: post-procedure vital signs reviewed and stable Respiratory status: spontaneous breathing, nonlabored ventilation, respiratory function stable and patient connected to nasal cannula oxygen Cardiovascular status: blood pressure returned to baseline and stable Postop Assessment: no apparent nausea or vomiting Anesthetic complications: no    Last Vitals:  Vitals:   02/07/20 1130 02/07/20 1145  BP: (!) 154/86 (!) 160/84  Pulse: 84 80  Resp: 11 12  Temp:    SpO2: 94% 93%    Last Pain:  Vitals:   02/07/20 1130  TempSrc:   PainSc: Asleep                 Nataleigh Griffin S

## 2020-02-07 NOTE — Progress Notes (Signed)
Dr. Jeffie Pollock informed of bloody urine w 3 clots passed on  first void  p cath removal.  appox 10 cc clear pink urine w 2nd void. No new orders.  Dr. Jeffie Pollock ok w pt staying to void again.

## 2020-02-07 NOTE — Progress Notes (Signed)
Reported urinary frrequency and Urine output of 50 cc clear pink. Bladder scan of 0 cc to Dr. Jeffie Pollock. Okay to discharge. Patient also reported that urine stream seems to be more than before the surgery.

## 2020-02-07 NOTE — Anesthesia Preprocedure Evaluation (Signed)
Anesthesia Evaluation  Patient identified by MRN, date of birth, ID band Patient awake    Reviewed: Allergy & Precautions, NPO status , Patient's Chart, lab work & pertinent test results  Airway Mallampati: II  TM Distance: >3 FB Neck ROM: Full    Dental no notable dental hx.    Pulmonary neg pulmonary ROS,    Pulmonary exam normal breath sounds clear to auscultation       Cardiovascular hypertension, Pt. on medications Normal cardiovascular exam Rhythm:Regular Rate:Normal     Neuro/Psych negative neurological ROS  negative psych ROS   GI/Hepatic Neg liver ROS, GERD  Medicated,  Endo/Other  Hypothyroidism   Renal/GU negative Renal ROS  negative genitourinary   Musculoskeletal negative musculoskeletal ROS (+)   Abdominal   Peds negative pediatric ROS (+)  Hematology negative hematology ROS (+)   Anesthesia Other Findings   Reproductive/Obstetrics negative OB ROS                             Anesthesia Physical Anesthesia Plan  ASA: II  Anesthesia Plan: General   Post-op Pain Management:    Induction: Intravenous  PONV Risk Score and Plan: 2 and Ondansetron, Dexamethasone and Treatment may vary due to age or medical condition  Airway Management Planned: LMA  Additional Equipment:   Intra-op Plan:   Post-operative Plan: Extubation in OR  Informed Consent: I have reviewed the patients History and Physical, chart, labs and discussed the procedure including the risks, benefits and alternatives for the proposed anesthesia with the patient or authorized representative who has indicated his/her understanding and acceptance.     Dental advisory given  Plan Discussed with: CRNA and Surgeon  Anesthesia Plan Comments:         Anesthesia Quick Evaluation

## 2020-02-07 NOTE — Transfer of Care (Signed)
Immediate Anesthesia Transfer of Care Note  Patient: Isaac Guerrero  Procedure(s) Performed: Procedure(s) (LRB): CYSTOSCOPY WITH BLADDER AND PROSTATIS URETHRAL  BIOPSY WIHT FULGURATION AND INSTILL GEMCITABINE (N/A)  Patient Location: PACU  Anesthesia Type: General  Level of Consciousness: awake, sedated, patient cooperative and responds to stimulation  Airway & Oxygen Therapy: Patient Spontanous Breathing and Patient connected to Plessis 02 and soft FM   Post-op Assessment: Report given to PACU RN, Post -op Vital signs reviewed and stable and Patient moving all extremities  Post vital signs: Reviewed and stable  Complications: No apparent anesthesia complications

## 2020-02-07 NOTE — Anesthesia Procedure Notes (Signed)
Procedure Name: LMA Insertion Date/Time: 02/07/2020 9:12 AM Performed by: Justice Rocher, CRNA Pre-anesthesia Checklist: Patient identified, Emergency Drugs available, Suction available, Patient being monitored and Timeout performed Patient Re-evaluated:Patient Re-evaluated prior to induction Oxygen Delivery Method: Circle system utilized Preoxygenation: Pre-oxygenation with 100% oxygen Induction Type: IV induction Ventilation: Mask ventilation without difficulty LMA: LMA inserted LMA Size: 4.0 Number of attempts: 1 Airway Equipment and Method: Bite block Placement Confirmation: positive ETCO2,  CO2 detector and breath sounds checked- equal and bilateral Tube secured with: Tape Dental Injury: Teeth and Oropharynx as per pre-operative assessment

## 2020-02-07 NOTE — Discharge Instructions (Signed)
Transurethral Resection of Bladder Tumor, Care After This sheet gives you information about how to care for yourself after your procedure. Your health care provider may also give you more specific instructions. If you have problems or questions, contact your health care provider. What can I expect after the procedure? After the procedure, it is common to have:  A small amount of blood in your urine for up to 2 weeks.  Soreness or mild pain from your catheter. After your catheter is removed, you may have mild soreness, especially when urinating.  Pain in your lower abdomen. Follow these instructions at home: Medicines   Take over-the-counter and prescription medicines only as told by your health care provider.  If you were prescribed an antibiotic medicine, take it as told by your health care provider. Do not stop taking the antibiotic even if you start to feel better.  Do not drive for 24 hours if you were given a sedative during your procedure.  Ask your health care provider if the medicine prescribed to you: ? Requires you to avoid driving or using heavy machinery. ? Can cause constipation. You may need to take these actions to prevent or treat constipation:  Take over-the-counter or prescription medicines.  Eat foods that are high in fiber, such as beans, whole grains, and fresh fruits and vegetables.  Limit foods that are high in fat and processed sugars, such as fried or sweet foods. Activity  Return to your normal activities as told by your health care provider. Ask your health care provider what activities are safe for you.  Do not lift anything that is heavier than 10 lb (4.5 kg), or the limit that you are told, until your health care provider says that it is safe.  Avoid intense physical activity for as long as told by your health care provider.  Rest as told by your health care provider.  Avoid sitting for a long time without moving. Get up to take short walks every  1-2 hours. This is important to improve blood flow and breathing. Ask for help if you feel weak or unsteady. General instructions   Do not drink alcohol for as long as told by your health care provider. This is especially important if you are taking prescription pain medicines.  Do not take baths, swim, or use a hot tub until your health care provider approves. Ask your health care provider if you may take showers. You may only be allowed to take sponge baths.  If you have a catheter, follow instructions from your health care provider about caring for your catheter and your drainage bag.  Drink enough fluid to keep your urine pale yellow.  Wear compression stockings as told by your health care provider. These stockings help to prevent blood clots and reduce swelling in your legs.  Keep all follow-up visits as told by your health care provider. This is important. ? You will need to be followed closely with regular checks of your bladder and urethra (cystoscopies) to make sure that the cancer does not come back. Contact a health care provider if:  You have pain that gets worse or does not improve with medicine.  You have blood in your urine for more than 2 weeks.  You have cloudy or bad-smelling urine.  You become constipated. Signs of constipation may include having: ? Fewer than three bowel movements in a week. ? Difficulty having a bowel movement. ? Stools that are dry, hard, or larger than normal.  You have   a fever. Get help right away if:  You have: ? Severe pain. ? Bright red blood in your urine. ? Blood clots in your urine. ? A lot of blood in your urine.  Your catheter has been removed and you are not able to urinate.  You have a catheter in place and the catheter is not draining urine. Summary  After your procedure, it is common to have a small amount of blood in your urine, soreness or mild pain from your catheter, and pain in your lower abdomen.  Take  over-the-counter and prescription medicines only as told by your health care provider.  Rest as told by your health care provider. Follow your health care provider's instructions about returning to normal activities. Ask what activities are safe for you.  If you have a catheter, follow instructions from your health care provider about caring for your catheter and your drainage bag.  Get help right away if you cannot urinate, you have severe pain, or you have bright red blood or blood clots in your urine. This information is not intended to replace advice given to you by your health care provider. Make sure you discuss any questions you have with your health care provider. Document Revised: 03/18/2018 Document Reviewed: 03/18/2018 Elsevier Patient Education  2020 [employer]   Post Anesthesia Home Care Instructions  Activity: Get plenty of rest for the remainder of the day. A responsible individual must stay with you for 24 hours following the procedure.  For the next 24 hours, DO NOT: -Drive a car -Operate machinery -Drink alcoholic beverages -Take any medication unless instructed by your physician -Make any legal decisions or sign important papers.  Meals: Start with liquid foods such as gelatin or soup. Progress to regular foods as tolerated. Avoid greasy, spicy, heavy foods. If nausea and/or vomiting occur, drink only clear liquids until the nausea and/or vomiting subsides. Call your physician if vomiting continues.  Special Instructions/Symptoms: Your throat may feel dry or sore from the anesthesia or the breathing tube placed in your throat during surgery. If this causes discomfort, gargle with warm salt water. The discomfort should disappear within 24 hours.  

## 2020-02-08 LAB — SURGICAL PATHOLOGY

## 2020-02-10 DIAGNOSIS — D696 Thrombocytopenia, unspecified: Secondary | ICD-10-CM | POA: Diagnosis not present

## 2020-02-10 DIAGNOSIS — D509 Iron deficiency anemia, unspecified: Secondary | ICD-10-CM | POA: Diagnosis not present

## 2020-02-10 DIAGNOSIS — B370 Candidal stomatitis: Secondary | ICD-10-CM | POA: Diagnosis not present

## 2020-02-10 DIAGNOSIS — E039 Hypothyroidism, unspecified: Secondary | ICD-10-CM | POA: Diagnosis not present

## 2020-02-15 DIAGNOSIS — K219 Gastro-esophageal reflux disease without esophagitis: Secondary | ICD-10-CM | POA: Diagnosis not present

## 2020-02-15 DIAGNOSIS — Z0001 Encounter for general adult medical examination with abnormal findings: Secondary | ICD-10-CM | POA: Diagnosis not present

## 2020-02-15 DIAGNOSIS — N182 Chronic kidney disease, stage 2 (mild): Secondary | ICD-10-CM | POA: Diagnosis not present

## 2020-02-15 DIAGNOSIS — R309 Painful micturition, unspecified: Secondary | ICD-10-CM | POA: Diagnosis not present

## 2020-02-16 ENCOUNTER — Ambulatory Visit (HOSPITAL_COMMUNITY)
Admission: RE | Admit: 2020-02-16 | Discharge: 2020-02-16 | Disposition: A | Discharge status: Home / Self Care | Payer: Medicare Other | Source: Ambulatory Visit | Attending: Internal Medicine | Admitting: Internal Medicine

## 2020-02-16 ENCOUNTER — Other Ambulatory Visit: Payer: Self-pay

## 2020-02-16 DIAGNOSIS — D090 Carcinoma in situ of bladder: Secondary | ICD-10-CM | POA: Diagnosis not present

## 2020-02-16 DIAGNOSIS — R911 Solitary pulmonary nodule: Secondary | ICD-10-CM | POA: Insufficient documentation

## 2020-02-16 DIAGNOSIS — R3915 Urgency of urination: Secondary | ICD-10-CM | POA: Diagnosis not present

## 2020-02-16 DIAGNOSIS — R350 Frequency of micturition: Secondary | ICD-10-CM | POA: Diagnosis not present

## 2020-03-02 DIAGNOSIS — E119 Type 2 diabetes mellitus without complications: Secondary | ICD-10-CM | POA: Diagnosis not present

## 2020-03-12 DIAGNOSIS — Z5111 Encounter for antineoplastic chemotherapy: Secondary | ICD-10-CM | POA: Diagnosis not present

## 2020-03-12 DIAGNOSIS — D090 Carcinoma in situ of bladder: Secondary | ICD-10-CM | POA: Diagnosis not present

## 2020-03-19 DIAGNOSIS — Z5111 Encounter for antineoplastic chemotherapy: Secondary | ICD-10-CM | POA: Diagnosis not present

## 2020-03-19 DIAGNOSIS — D090 Carcinoma in situ of bladder: Secondary | ICD-10-CM | POA: Diagnosis not present

## 2020-03-26 DIAGNOSIS — Z5111 Encounter for antineoplastic chemotherapy: Secondary | ICD-10-CM | POA: Diagnosis not present

## 2020-03-26 DIAGNOSIS — D090 Carcinoma in situ of bladder: Secondary | ICD-10-CM | POA: Diagnosis not present

## 2020-04-02 DIAGNOSIS — D090 Carcinoma in situ of bladder: Secondary | ICD-10-CM | POA: Diagnosis not present

## 2020-04-02 DIAGNOSIS — Z5111 Encounter for antineoplastic chemotherapy: Secondary | ICD-10-CM | POA: Diagnosis not present

## 2020-04-09 DIAGNOSIS — Z5111 Encounter for antineoplastic chemotherapy: Secondary | ICD-10-CM | POA: Diagnosis not present

## 2020-04-09 DIAGNOSIS — D090 Carcinoma in situ of bladder: Secondary | ICD-10-CM | POA: Diagnosis not present

## 2020-04-10 ENCOUNTER — Encounter: Payer: Self-pay | Admitting: Internal Medicine

## 2020-04-10 ENCOUNTER — Other Ambulatory Visit: Payer: Self-pay

## 2020-04-10 ENCOUNTER — Ambulatory Visit: Payer: Medicare Other | Admitting: Internal Medicine

## 2020-04-10 ENCOUNTER — Ambulatory Visit (HOSPITAL_COMMUNITY)
Admission: RE | Admit: 2020-04-10 | Discharge: 2020-04-10 | Disposition: A | Discharge status: Home / Self Care | Payer: Medicare Other | Source: Ambulatory Visit | Attending: Internal Medicine | Admitting: Internal Medicine

## 2020-04-10 DIAGNOSIS — R0600 Dyspnea, unspecified: Secondary | ICD-10-CM

## 2020-04-10 DIAGNOSIS — R918 Other nonspecific abnormal finding of lung field: Secondary | ICD-10-CM

## 2020-04-10 DIAGNOSIS — R0602 Shortness of breath: Secondary | ICD-10-CM | POA: Diagnosis not present

## 2020-04-10 DIAGNOSIS — R911 Solitary pulmonary nodule: Secondary | ICD-10-CM | POA: Diagnosis not present

## 2020-04-10 DIAGNOSIS — R0609 Other forms of dyspnea: Secondary | ICD-10-CM | POA: Insufficient documentation

## 2020-04-10 NOTE — Patient Instructions (Addendum)
Please remember to go to the  x-ray department at [facility]   for your tests - we will call you with the results when they are available     Please schedule a follow up office visit in 6 weeks, call sooner if needed Error, f/u should be 6 months

## 2020-04-10 NOTE — Assessment & Plan Note (Addendum)
Onset 2014 with cpst 08/31/13  88% of nl with no cardiopulmonary limitation  - 04/10/2020 no change ex tol since 2014   States really Not limited by breathing from desired activities  But walking up to a mile for exercise now s difficulty so doubt needs repeat pfts or cpst at this point but strongly favor continued submax ex x  30 min 3 x weeksly at a min          Each maintenance medication was reviewed in detail including emphasizing most importantly the difference between maintenance and prns and under what circumstances the prns are to be triggered using an action plan format where appropriate.  Total time for H and P, chart review, counseling, teaching device and generating customized AVS unique to this office visit / charting = 60 min

## 2020-04-10 NOTE — Progress Notes (Addendum)
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   Sleeping  without nocturnal  or early am exacerbation  of respiratory  c/o's or need for noct saba. Also denies any obvious fluctuation of symptoms with weather or environmental changes or other aggravating or alleviating factors except as outlined above   No unusual exposure hx or h/o childhood pna/ asthma or knowledge of premature birth.  Current Allergies, Complete Past Medical History, Past Surgical History, Family History, and Social History were reviewed in [employer] record.  ROS  The following are not active complaints unless bolded Hoarseness, sore throat, dysphagia, dental problems, itching, sneezing,  nasal congestion or discharge of excess mucus or purulent secretions, ear ache,   fever, chills, sweats, unintended wt loss or wt gain, classically pleuritic or exertional cp,  orthopnea pnd or arm/hand swelling  or leg swelling, presyncope, palpitations, abdominal pain, anorexia, nausea,  vomiting, diarrhea  or change in bowel habits or change in bladder habits, change in stools or change in urine, dysuria, hematuria,  rash, arthralgias, visual complaints, headache, numbness, weakness or ataxia or problems with walking or coordination,  change in mood or  memory.           Past Medical History:  Diagnosis Date  . Arthritis   . BPH (benign prostatic hyperplasia) 08/1989  . GERD (gastroesophageal reflux disease) 11/29/1998  . Hyperlipidemia 08/1997  . Hypertension   . Hypothyroidism 1983  . Lesion of bladder   . Spermatocele    per Dr. Jeffie Pollock with [facility]    Outpatient Medications Prior to Visit  Medication Sig Dispense Refill  . b complex vitamins tablet Take 1 tablet by mouth daily.      . Cholecalciferol (VITAMIN D3) 2000 UNITS TABS Take by mouth daily.      . Coenzyme Q10 200 MG capsule Take 200 mg by mouth daily.      . ferrous sulfate 325 (65 FE) MG tablet Take 325 mg by mouth daily with breakfast.    . gabapentin (NEURONTIN) 100 MG capsule Take 100 mg by mouth 2 (two) times daily.    [name] levothyroxine (LEVOXYL) 88 MCG tablet Take 88 mcg by mouth daily.      [name] loratadine (CLARITIN) 10 MG tablet Take 10 mg by mouth daily.    [name] losartan (COZAAR) 50 MG tablet Take 50 mg by mouth daily.    [name]  meloxicam (MOBIC) 15 MG tablet as needed.   1  . pantoprazole (PROTONIX) 40 MG tablet Take 40 mg by mouth daily.    . traMADol (ULTRAM) 50 MG tablet Take 50 mg by mouth every 6 (six) hours as needed.    [name] ZOCOR 40 MG tablet TAKE ONE TABLET BY MOUTH IN THE EVENING. 30 each 4  . Multiple Vitamin (MULTIVITAMIN) capsule Take 1 capsule by mouth daily.    . ANDROGEL PUMP 20.25 MG/ACT (1.62%) GEL Apply topically See admin instructions. Apply one pump to either shoulder once daily (Patient not taking: Reported on 04/10/2020)    . vitamin C (ASCORBIC ACID) 500 MG tablet Take 500 mg by mouth daily.   (Patient not taking: Reported on 04/10/2020)     No facility-administered medications prior to  visit.     Objective:     BP 128/80 (BP Location: Left Arm, Cuff Size: Normal)   Pulse 74   Temp 98.6 F (37 C) (Oral)   Ht 5' 10" (1.778 m)   Wt 184 lb 12.8 oz (83.8 kg)   SpO2 96% Comment: Room air  BMI 26.52 kg/m   SpO2: 96 % (Room air)   Somber amb wm nad    HEENT : pt wearing mask not removed for exam due to covid -19 concerns.    NECK :  without JVD/Nodes/TM/ nl carotid upstrokes bilaterally   LUNGS: no acc muscle use,  Nl contour chest which is clear to A and P bilaterally without cough on insp or exp maneuvers   CV:  RRR  no s3 or murmur or increase in P2, and no edema   ABD:  soft and nontender with nl inspiratory excursion in the supine position. No bruits or organomegaly appreciated, bowel sounds nl  MS:  Nl gait/ ext warm without deformities, calf tenderness, cyanosis or clubbing No obvious joint restrictions   SKIN: warm and dry without lesions    NEURO:  alert, approp, nl sensorium with  no motor or cerebellar deficits apparent.     I personally reviewed images and agree with radiology impression as follows:  Chest CT w/o contrast 6/17 21  Progressive bilateral pulmonary nodules measuring up to 7 mm, some of which are minimally progressive from 2015 but many of which are new. Dominant peribronchovascular distribution in the right middle lobe with associated scarring/atelectasis.  Calcified mediastinal and hilar lymphadenopathy, chronic/unchanged from 2015.  Overall, these findings are compatible with sarcoidosis.  None of these nodules are considered overly suspicious for primary bronchogenic neoplasm in that context  CXR PA and Lateral:   04/10/2020 :    I personally reviewed images and agree with radiology impression as follows:    Stable radiographic appearance of the chest with partially calcified adenopathy and nodularity consistent with sarcoidosis as correlated with prior CT. No acute superimposed process identified.      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   We don't even know when the "new" nodules developed as comparing CT's from > 6 y prior doesn't mean they are really new, esp in the absence of any new pulmonary symptoms.   Best bet is baseline cxr now then return p BCG for labs  ESR/ ACE/ LFT's and Calcium level.   Discussed in detail all the  indications, usual  risks and alternatives  relative to the benefits with patient who agrees to proceed with conservative f/u as outlined       DOE (dyspnea on exertion) Onset 2014 with cpst 08/31/13  88% of nl with no cardiopulmonary limitation  - 04/10/2020 no change ex tol since 2014   States really Not limited by breathing from desired activities  But walking up to a mile for exercise now s difficulty so doubt needs repeat pfts or cpst at this point but strongly favor continued submax ex x  30 min 3 x weeksly at a min   >>> f/u 6 months    Each maintenance medication was reviewed in detail including emphasizing most importantly the difference between maintenance and prns and under what circumstances the prns are to be triggered using an action plan format where appropriate.  Total time for H and P, chart review, counseling, teaching device and generating customized AVS unique to this office visit / charting = 60 min        Christinia Gully, MD 04/10/2020

## 2020-04-10 NOTE — Assessment & Plan Note (Signed)
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   We don't even know when the "new" nodules developed as comparing CT's from > 6 y prior doesn't mean they are really new, esp in the absence of any new pulmonary symptoms.   Best bet is baseline cxr now then return p BCG for labs  ESR/ ACE/ LFT's and Calcium level.   Discussed in detail all the  indications, usual  risks and alternatives  relative to the benefits with patient who agrees to proceed with conservative f/u as outlined

## 2020-04-11 DIAGNOSIS — H25811 Combined forms of age-related cataract, right eye: Secondary | ICD-10-CM | POA: Diagnosis not present

## 2020-04-11 DIAGNOSIS — Z961 Presence of intraocular lens: Secondary | ICD-10-CM | POA: Diagnosis not present

## 2020-04-11 NOTE — Progress Notes (Signed)
Called and left message about xray results per Dr Melvyn Novas and DPR. Advised to please feel free to call us back if he had any questions. Nothing further needed at this time.

## 2020-04-12 ENCOUNTER — Telehealth: Payer: Self-pay | Admitting: *Deleted

## 2020-04-12 NOTE — Telephone Encounter (Signed)
Tried calling the pt- LMTCB  Needs to cancel appt for 05/25/20 and schedule 6 mo reminder for [place]- I already placed recall

## 2020-04-12 NOTE — Telephone Encounter (Signed)
-----   Message from Tanda Rockers, MD sent at 04/11/2020  6:17 AM EDT ----- Error on avs, f/u should have been 6 months, not 6 weeks

## 2020-04-12 NOTE — Telephone Encounter (Signed)
Spoke with the pt and cancelled his appt and he is aware recall in for 6 months for [place] office

## 2020-04-16 DIAGNOSIS — Z5111 Encounter for antineoplastic chemotherapy: Secondary | ICD-10-CM | POA: Diagnosis not present

## 2020-04-16 DIAGNOSIS — D090 Carcinoma in situ of bladder: Secondary | ICD-10-CM | POA: Diagnosis not present

## 2020-05-02 DIAGNOSIS — Z0120 Encounter for dental examination and cleaning without abnormal findings: Secondary | ICD-10-CM | POA: Diagnosis not present

## 2020-05-24 DIAGNOSIS — R309 Painful micturition, unspecified: Secondary | ICD-10-CM | POA: Diagnosis not present

## 2020-05-24 DIAGNOSIS — I10 Essential (primary) hypertension: Secondary | ICD-10-CM | POA: Diagnosis not present

## 2020-05-24 DIAGNOSIS — J060 Acute laryngopharyngitis: Secondary | ICD-10-CM | POA: Diagnosis not present

## 2020-05-24 DIAGNOSIS — S76311A Strain of muscle, fascia and tendon of the posterior muscle group at thigh level, right thigh, initial encounter: Secondary | ICD-10-CM | POA: Diagnosis not present

## 2020-05-25 ENCOUNTER — Ambulatory Visit: Payer: Medicare Other | Admitting: Internal Medicine

## 2020-05-30 DIAGNOSIS — N182 Chronic kidney disease, stage 2 (mild): Secondary | ICD-10-CM | POA: Diagnosis not present

## 2020-05-30 DIAGNOSIS — K219 Gastro-esophageal reflux disease without esophagitis: Secondary | ICD-10-CM | POA: Diagnosis not present

## 2020-05-30 DIAGNOSIS — R309 Painful micturition, unspecified: Secondary | ICD-10-CM | POA: Diagnosis not present

## 2020-05-30 DIAGNOSIS — Z23 Encounter for immunization: Secondary | ICD-10-CM | POA: Diagnosis not present

## 2020-06-14 DIAGNOSIS — H25811 Combined forms of age-related cataract, right eye: Secondary | ICD-10-CM | POA: Diagnosis not present

## 2020-06-14 DIAGNOSIS — H52221 Regular astigmatism, right eye: Secondary | ICD-10-CM | POA: Diagnosis not present

## 2020-07-10 DIAGNOSIS — E782 Mixed hyperlipidemia: Secondary | ICD-10-CM | POA: Diagnosis not present

## 2020-07-10 DIAGNOSIS — E1169 Type 2 diabetes mellitus with other specified complication: Secondary | ICD-10-CM | POA: Diagnosis not present

## 2020-07-10 DIAGNOSIS — E039 Hypothyroidism, unspecified: Secondary | ICD-10-CM | POA: Diagnosis not present

## 2020-07-10 DIAGNOSIS — N182 Chronic kidney disease, stage 2 (mild): Secondary | ICD-10-CM | POA: Diagnosis not present

## 2020-07-19 NOTE — Progress Notes (Signed)
Subjective:  1. History of bladder cancer   2. Urinary frequency   3. 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 Marland KitchenHe is voiding with reduced LUTS with an IPSS of 9.  He has had no hematuria.   01/16/20: Isaac Guerrero returns today in f/u. His cytology is positive. A CT today showed bilateral renal cysts and prostate enlargement but no bladder lesions or upper tract lesions. His IPSS remains elevated at 24.   01/04/20: Isaac Guerrero returns today in f/u for his LUTS with OAB. He was given a trial of Vesicare on 12/01/19 but didin't tolerate it or notice improvement . His IPSS is 25 with frequency and a sensation of incomplete emptying as primary issues. he has nocturia x 4 and urgency. He has some chronic dysuria and Azo doesn't help. He had no gross hematuria but he has 3-10 RBC's today.   12/01/2019: Seen in the past for ED and balanitis by Dr Jeffie Pollock. He is on Androgel for hypogonadism managed by his primary care physician. Presents today complaining of worsening frequency/urgency and nocturia that has persistently increased since the fall of last year. His PCP has previously trialed him on tamsulosin, Toviaz, and Myrbetriq with patient reporting no notable difference is in voiding symptoms with either of the medications. Not associated with any hesitancy or straining. He tells me tries to drink a lot of water but that seems to only make symptoms worse. Also associated with chronic intermittent burning with urination. He does not have any associated lower back or flank pain/discomfort. He denies any visible blood in the urine. Not associated with fevers or chills, nausea/vomiting.     IPSS    Row Name 07/20/20 1000         International Prostate Symptom Score   How often have you had the sensation of not emptying your bladder? Less  than 1 in 5     How often have you had to urinate less than every two hours? About half the time     How often have you found you stopped and started again several times when you urinated? Less than 1 in 5 times     How often have you found it difficult to postpone urination? Less than 1 in 5 times     How often have you had a weak urinary stream? Less than 1 in 5 times     How often have you had to strain to start urination? Not at All     How many times did you typically get up at night to urinate? 2 Times     Total IPSS Score 9       Quality of Life due to urinary symptoms   If you were to spend the rest of your life with your urinary condition just the way it is now how would you feel about that? Mostly Satisfied             ROS:  ROS:  A complete review of systems was performed.  All systems are negative except for pertinent findings as noted.   ROS  Allergies  Allergen Reactions  . Cephalexin Rash  . Penicillins Rash  . Sulfonamide Derivatives Rash    Outpatient Encounter Medications as of 07/20/2020  Medication Sig  . b complex vitamins tablet  Take 1 tablet by mouth daily.    . Cholecalciferol (VITAMIN D3) 2000 UNITS TABS Take by mouth daily.    . Coenzyme Q10 200 MG capsule Take 200 mg by mouth daily.    . ferrous sulfate 325 (65 FE) MG tablet Take 325 mg by mouth daily with breakfast.  . gabapentin (NEURONTIN) 100 MG capsule Take 100 mg by mouth 2 (two) times daily.  [name] levothyroxine (LEVOXYL) 88 MCG tablet Take 88 mcg by mouth daily.    [name] loratadine (CLARITIN) 10 MG tablet Take 10 mg by mouth daily.  [name] losartan (COZAAR) 50 MG tablet Take 50 mg by mouth daily.  . meloxicam (MOBIC) 15 MG tablet as needed.   . pantoprazole (PROTONIX) 40 MG tablet Take 40 mg by mouth daily.  . traMADol (ULTRAM) 50 MG tablet Take 50 mg by mouth every 6 (six) hours as needed.  [name] ZOCOR 40 MG tablet TAKE ONE TABLET BY MOUTH IN THE EVENING.   Facility-Administered Encounter Medications as  of 07/20/2020  Medication  . ciprofloxacin (CIPRO) tablet 500 mg    Past Medical History:  Diagnosis Date  . Arthritis   . BPH (benign prostatic hyperplasia) 08/1989  . GERD (gastroesophageal reflux disease) 11/29/1998  . Hyperlipidemia 08/1997  . Hypertension   . Hypothyroidism 1983  . Lesion of bladder   . Spermatocele    per Dr. Jeffie Pollock with URO    Past Surgical History:  Procedure Laterality Date  . CATARACT EXTRACTION  03/2003   left eye  . COLONOSCOPY  08/10/09   polyp in ascending colon otherwise normal  . COLONOSCOPY WITH ESOPHAGOGASTRODUODENOSCOPY (EGD) N/A 10/28/2013   Dr. Rourk:Schatzki's ring - not manipulated because no dysphagia currently. Small hiatal hernia.  Gastric and duodenal erosions of uncertain significance-status post biopsy. path with benign findings, no villous atrophy, no H.pylori/TCS:Colonic diverticulosis  . CYSTOSCOPY  08/1989   normal  . CYSTOSCOPY WITH BIOPSY N/A 02/07/2020   Procedure: CYSTOSCOPY WITH BLADDER AND PROSTATIS URETHRAL  BIOPSY WIHT FULGURATION AND INSTILL GEMCITABINE;  Surgeon: Irine Seal, MD;  Location: [facility];  Service: Urology;  Laterality: N/A;  . ESOPHAGOGASTRODUODENOSCOPY  10/1999   distal esoph. stricture; sliding H. H.  . LASIK  04/12/2003   right eye  . lumbar microdisectomy  08/2000   Dr. Hal Neer    Social History   Socioeconomic History  . Marital status: Married    Spouse name: Not on file  . Number of children: 1  . Years of education: Not on file  . Highest education level: Not on file  Occupational History  . Occupation: Music therapist: [employer]    Comment: Music therapist  Tobacco Use  . Smoking status: Never Smoker  . Smokeless tobacco: Never Used  Vaping Use  . Vaping Use: Never used  Substance and Sexual Activity  . Alcohol use: No  . Drug use: No  . Sexual activity: Yes  Other Topics Concern  . Not on file  Social History Narrative   Married and lives with  wife   Social Determinants of Health   Financial Resource Strain:   . Difficulty of Paying Living Expenses: Not on file  Food Insecurity:   . Worried About Charity fundraiser in the Last Year: Not on file  . Ran Out of Food in the Last Year: Not on file  Transportation Needs:   . Lack of Transportation (Medical): Not on file  . Lack of Transportation (Non-Medical): Not on file  Physical Activity:   . Days of Exercise per Week: Not on file  . Minutes of Exercise per Session: Not on file  Stress:   . Feeling of Stress : Not on file  Social Connections:   . Frequency of Communication with Friends and Family: Not on file  . Frequency of Social Gatherings with Friends and Family: Not on file  . Attends Religious Services: Not on file  . Active Member of Clubs or Organizations: Not on file  . Attends Archivist Meetings: Not on file  . Marital Status: Not on file  Intimate Partner Violence:   . Fear of Current or Ex-Partner: Not on file  . Emotionally Abused: Not on file  . Physically Abused: Not on file  . Sexually Abused: Not on file    Family History  Problem Relation Age of Onset  . Hypertension Mother   . Hyperlipidemia Mother   . Diabetes Mother 36  . Heart disease Father        CABG X 4  12/2001  . Hyperlipidemia Father   . Hypertension Father   . Heart disease Brother        CABG X 1  . Heart disease Paternal Uncle        MI  . Cancer Maternal Grandfather        colon  . Diabetes Maternal Uncle   . Arthritis Maternal Uncle   . Stroke Paternal Grandfather   . Allergic rhinitis Neg Hx   . Angioedema Neg Hx   . Asthma Neg Hx   . Atopy Neg Hx   . Eczema Neg Hx   . Immunodeficiency Neg Hx   . Urticaria Neg Hx        Objective: Vitals:   07/20/20 1032  BP: (!) 166/80  Pulse: 71  Temp: 98.4 F (36.9 C)     Physical Exam  Lab Results:  PSA PSA  Date Value Ref Range Status  08/05/2011 0.98 0.10 - 4.00 ng/mL Final  07/22/2010 0.85 0.10  - 4.00 ng/mL Final  03/26/2009 1.03 0.10 - 4.00 ng/mL Final   No results found for: TESTOSTERONE    Studies/Results: Results for orders placed or performed in visit on 07/20/20 (from the past 24 hour(s))  Urinalysis, Routine w reflex microscopic     Status: Abnormal   Collection Time: 07/20/20 10:30 AM  Result Value Ref Range   Specific Gravity, UA 1.025 1.005 - 1.030   pH, UA 6.0 5.0 - 7.5   Color, UA Yellow Yellow   Appearance Ur Clear Clear   Leukocytes,UA Trace (A) Negative   Protein,UA Negative Negative/Trace   Glucose, UA Negative Negative   Ketones, UA Negative Negative   RBC, UA Trace (A) Negative   Bilirubin, UA Negative Negative   Urobilinogen, Ur 1.0 0.2 - 1.0 mg/dL   Nitrite, UA Negative Negative   Microscopic Examination See below:    Narrative   Performed at:  [facility] [address] [place], [place]  [id number] Lab Director: Mina Marble MT, Phone:  [phone]  Microscopic Examination     Status: None   Collection Time: 07/20/20 10:30 AM   Urine  Result Value Ref Range   WBC, UA 0-5 0 - 5 /hpf   RBC 0-2 0 - 2 /hpf   Epithelial Cells (non renal) None seen 0 - 10 /hpf   Renal Epithel, UA None seen None seen /hpf   Mucus, UA Present Not Estab.   Bacteria, UA None  seen None seen/Few   Narrative   Performed at:  [facility] [address], [place], [place]  [id number] Lab Director: [place], Phone:  [phone]    Procedure: Flexible cystoscopy.  He was given cipro 500mg  and prepped with betadine.  The urethra was instilled with 14ml of lidocaine jelly.  Cystoscopy demonstrated a normal urethra, the prostate has bilobar hyperplasia with some coaptation.  The bladder wall has mild trabeculation.  There is patchy erythema of the dome that is consistent with either post BCG inflammation or possibly recurrent CIS.   The UO's are normal.   A barbotage cytology was obtained.    Assessment & Plan: History of CIS with  persistent lesions on the dome on cystoscopy.  I will get a cytology and if negative, he will return for BCG maintenance.  If atypical or positive, I will arrange cystoscopy with biopsy and fulguration first.  He will otherwise return in 3-4 months for cystoscopy.    OAB.  This has markedly improved with the BCG treatment of the CIS.     Meds ordered this encounter  Medications  . ciprofloxacin (CIPRO) tablet 500 mg     Orders Placed This Encounter  Procedures  . Microscopic Examination  . Urinalysis, Routine w reflex microscopic      Return in about 3 months (around 10/20/2020) for BCG maintenance x 3 in 2 weeks and cystoscopy in 3-4 months. .   CC: Celene Squibb, MD      Irine Seal 07/20/2020

## 2020-07-19 NOTE — H&P (View-Only) (Signed)
Subjective:  1. History of bladder cancer   2. Urinary frequency   3. 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 Marland KitchenHe is voiding with reduced LUTS with an IPSS of 9.  He has had no hematuria.   01/16/20: Isaac Guerrero returns today in f/u. His cytology is positive. A CT today showed bilateral renal cysts and prostate enlargement but no bladder lesions or upper tract lesions. His IPSS remains elevated at 24.   01/04/20: Isaac Guerrero returns today in f/u for his LUTS with OAB. He was given a trial of Vesicare on 12/01/19 but didin't tolerate it or notice improvement . His IPSS is 25 with frequency and a sensation of incomplete emptying as primary issues. he has nocturia x 4 and urgency. He has some chronic dysuria and Azo doesn't help. He had no gross hematuria but he has 3-10 RBC's today.   12/01/2019: Seen in the past for ED and balanitis by Dr Jeffie Pollock. He is on Androgel for hypogonadism managed by his primary care physician. Presents today complaining of worsening frequency/urgency and nocturia that has persistently increased since the fall of last year. His PCP has previously trialed him on tamsulosin, Toviaz, and Myrbetriq with patient reporting no notable difference is in voiding symptoms with either of the medications. Not associated with any hesitancy or straining. He tells me tries to drink a lot of water but that seems to only make symptoms worse. Also associated with chronic intermittent burning with urination. He does not have any associated lower back or flank pain/discomfort. He denies any visible blood in the urine. Not associated with fevers or chills, nausea/vomiting.     IPSS    Row Name 07/20/20 1000         International Prostate Symptom Score   How often have you had the sensation of not emptying your bladder? Less  than 1 in 5     How often have you had to urinate less than every two hours? About half the time     How often have you found you stopped and started again several times when you urinated? Less than 1 in 5 times     How often have you found it difficult to postpone urination? Less than 1 in 5 times     How often have you had a weak urinary stream? Less than 1 in 5 times     How often have you had to strain to start urination? Not at All     How many times did you typically get up at night to urinate? 2 Times     Total IPSS Score 9       Quality of Life due to urinary symptoms   If you were to spend the rest of your life with your urinary condition just the way it is now how would you feel about that? Mostly Satisfied             ROS:  ROS:  A complete review of systems was performed.  All systems are negative except for pertinent findings as noted.   ROS  Allergies  Allergen Reactions  . Cephalexin Rash  . Penicillins Rash  . Sulfonamide Derivatives Rash    Outpatient Encounter Medications as of 07/20/2020  Medication Sig  . b complex vitamins tablet  Take 1 tablet by mouth daily.    . Cholecalciferol (VITAMIN D3) 2000 UNITS TABS Take by mouth daily.    . Coenzyme Q10 200 MG capsule Take 200 mg by mouth daily.    . ferrous sulfate 325 (65 FE) MG tablet Take 325 mg by mouth daily with breakfast.  . gabapentin (NEURONTIN) 100 MG capsule Take 100 mg by mouth 2 (two) times daily.  [name] levothyroxine (LEVOXYL) 88 MCG tablet Take 88 mcg by mouth daily.    [name] loratadine (CLARITIN) 10 MG tablet Take 10 mg by mouth daily.  [name] losartan (COZAAR) 50 MG tablet Take 50 mg by mouth daily.  . meloxicam (MOBIC) 15 MG tablet as needed.   . pantoprazole (PROTONIX) 40 MG tablet Take 40 mg by mouth daily.  . traMADol (ULTRAM) 50 MG tablet Take 50 mg by mouth every 6 (six) hours as needed.  [name] ZOCOR 40 MG tablet TAKE ONE TABLET BY MOUTH IN THE EVENING.   Facility-Administered Encounter Medications as  of 07/20/2020  Medication  . ciprofloxacin (CIPRO) tablet 500 mg    Past Medical History:  Diagnosis Date  . Arthritis   . BPH (benign prostatic hyperplasia) 08/1989  . GERD (gastroesophageal reflux disease) 11/29/1998  . Hyperlipidemia 08/1997  . Hypertension   . Hypothyroidism 1983  . Lesion of bladder   . Spermatocele    per Dr. Jeffie Pollock with URO    Past Surgical History:  Procedure Laterality Date  . CATARACT EXTRACTION  03/2003   left eye  . COLONOSCOPY  08/10/09   polyp in ascending colon otherwise normal  . COLONOSCOPY WITH ESOPHAGOGASTRODUODENOSCOPY (EGD) N/A 10/28/2013   Dr. Rourk:Schatzki's ring - not manipulated because no dysphagia currently. Small hiatal hernia.  Gastric and duodenal erosions of uncertain significance-status post biopsy. path with benign findings, no villous atrophy, no H.pylori/TCS:Colonic diverticulosis  . CYSTOSCOPY  08/1989   normal  . CYSTOSCOPY WITH BIOPSY N/A 02/07/2020   Procedure: CYSTOSCOPY WITH BLADDER AND PROSTATIS URETHRAL  BIOPSY WIHT FULGURATION AND INSTILL GEMCITABINE;  Surgeon: Irine Seal, MD;  Location: [facility];  Service: Urology;  Laterality: N/A;  . ESOPHAGOGASTRODUODENOSCOPY  10/1999   distal esoph. stricture; sliding H. H.  . LASIK  04/12/2003   right eye  . lumbar microdisectomy  08/2000   Dr. Hal Neer    Social History   Socioeconomic History  . Marital status: Married    Spouse name: Not on file  . Number of children: 1  . Years of education: Not on file  . Highest education level: Not on file  Occupational History  . Occupation: Music therapist: [employer]    Comment: Music therapist  Tobacco Use  . Smoking status: Never Smoker  . Smokeless tobacco: Never Used  Vaping Use  . Vaping Use: Never used  Substance and Sexual Activity  . Alcohol use: No  . Drug use: No  . Sexual activity: Yes  Other Topics Concern  . Not on file  Social History Narrative   Married and lives with  wife   Social Determinants of Health   Financial Resource Strain:   . Difficulty of Paying Living Expenses: Not on file  Food Insecurity:   . Worried About Charity fundraiser in the Last Year: Not on file  . Ran Out of Food in the Last Year: Not on file  Transportation Needs:   . Lack of Transportation (Medical): Not on file  . Lack of Transportation (Non-Medical): Not on file  Physical Activity:   . Days of Exercise per Week: Not on file  . Minutes of Exercise per Session: Not on file  Stress:   . Feeling of Stress : Not on file  Social Connections:   . Frequency of Communication with Friends and Family: Not on file  . Frequency of Social Gatherings with Friends and Family: Not on file  . Attends Religious Services: Not on file  . Active Member of Clubs or Organizations: Not on file  . Attends Archivist Meetings: Not on file  . Marital Status: Not on file  Intimate Partner Violence:   . Fear of Current or Ex-Partner: Not on file  . Emotionally Abused: Not on file  . Physically Abused: Not on file  . Sexually Abused: Not on file    Family History  Problem Relation Age of Onset  . Hypertension Mother   . Hyperlipidemia Mother   . Diabetes Mother 39  . Heart disease Father        CABG X 4  12/2001  . Hyperlipidemia Father   . Hypertension Father   . Heart disease Brother        CABG X 1  . Heart disease Paternal Uncle        MI  . Cancer Maternal Grandfather        colon  . Diabetes Maternal Uncle   . Arthritis Maternal Uncle   . Stroke Paternal Grandfather   . Allergic rhinitis Neg Hx   . Angioedema Neg Hx   . Asthma Neg Hx   . Atopy Neg Hx   . Eczema Neg Hx   . Immunodeficiency Neg Hx   . Urticaria Neg Hx        Objective: Vitals:   07/20/20 1032  BP: (!) 166/80  Pulse: 71  Temp: 98.4 F (36.9 C)     Physical Exam  Lab Results:  PSA PSA  Date Value Ref Range Status  08/05/2011 0.98 0.10 - 4.00 ng/mL Final  07/22/2010 0.85 0.10  - 4.00 ng/mL Final  03/26/2009 1.03 0.10 - 4.00 ng/mL Final   No results found for: TESTOSTERONE    Studies/Results: Results for orders placed or performed in visit on 07/20/20 (from the past 24 hour(s))  Urinalysis, Routine w reflex microscopic     Status: Abnormal   Collection Time: 07/20/20 10:30 AM  Result Value Ref Range   Specific Gravity, UA 1.025 1.005 - 1.030   pH, UA 6.0 5.0 - 7.5   Color, UA Yellow Yellow   Appearance Ur Clear Clear   Leukocytes,UA Trace (A) Negative   Protein,UA Negative Negative/Trace   Glucose, UA Negative Negative   Ketones, UA Negative Negative   RBC, UA Trace (A) Negative   Bilirubin, UA Negative Negative   Urobilinogen, Ur 1.0 0.2 - 1.0 mg/dL   Nitrite, UA Negative Negative   Microscopic Examination See below:    Narrative   Performed at:  [facility] [address], [place], [place]  [id number] Lab Director: Mina Marble MT, Phone:  [phone]  Microscopic Examination     Status: None   Collection Time: 07/20/20 10:30 AM   Urine  Result Value Ref Range   WBC, UA 0-5 0 - 5 /hpf   RBC 0-2 0 - 2 /hpf   Epithelial Cells (non renal) None seen 0 - 10 /hpf   Renal Epithel, UA None seen None seen /hpf   Mucus, UA Present Not Estab.   Bacteria, UA None  seen None seen/Few   Narrative   Performed at:  [facility] [address], [place], [place]  [id number] Lab Director: [place], Phone:  [phone]    Procedure: Flexible cystoscopy.  He was given cipro 500mg  and prepped with betadine.  The urethra was instilled with 28ml of lidocaine jelly.  Cystoscopy demonstrated a normal urethra, the prostate has bilobar hyperplasia with some coaptation.  The bladder wall has mild trabeculation.  There is patchy erythema of the dome that is consistent with either post BCG inflammation or possibly recurrent CIS.   The UO's are normal.   A barbotage cytology was obtained.    Assessment & Plan: History of CIS with  persistent lesions on the dome on cystoscopy.  I will get a cytology and if negative, he will return for BCG maintenance.  If atypical or positive, I will arrange cystoscopy with biopsy and fulguration first.  He will otherwise return in 3-4 months for cystoscopy.    OAB.  This has markedly improved with the BCG treatment of the CIS.     Meds ordered this encounter  Medications  . ciprofloxacin (CIPRO) tablet 500 mg     Orders Placed This Encounter  Procedures  . Microscopic Examination  . Urinalysis, Routine w reflex microscopic      Return in about 3 months (around 10/20/2020) for BCG maintenance x 3 in 2 weeks and cystoscopy in 3-4 months. .   CC: Celene Squibb, MD      Irine Seal 07/20/2020

## 2020-07-20 ENCOUNTER — Encounter: Payer: Self-pay | Admitting: Urology

## 2020-07-20 ENCOUNTER — Ambulatory Visit (INDEPENDENT_AMBULATORY_CARE_PROVIDER_SITE_OTHER): Payer: Medicare Other | Admitting: Urology

## 2020-07-20 ENCOUNTER — Other Ambulatory Visit: Payer: Self-pay

## 2020-07-20 VITALS — BP 166/80 | HR 71 | Temp 98.4°F | Ht 70.0 in | Wt 185.0 lb

## 2020-07-20 DIAGNOSIS — R8289 Other abnormal findings on cytological and histological examination of urine: Secondary | ICD-10-CM | POA: Diagnosis not present

## 2020-07-20 DIAGNOSIS — R351 Nocturia: Secondary | ICD-10-CM | POA: Diagnosis not present

## 2020-07-20 DIAGNOSIS — Z8551 Personal history of malignant neoplasm of bladder: Secondary | ICD-10-CM | POA: Diagnosis not present

## 2020-07-20 DIAGNOSIS — R350 Frequency of micturition: Secondary | ICD-10-CM

## 2020-07-20 DIAGNOSIS — C679 Malignant neoplasm of bladder, unspecified: Secondary | ICD-10-CM | POA: Diagnosis not present

## 2020-07-20 LAB — URINALYSIS, ROUTINE W REFLEX MICROSCOPIC
Bilirubin, UA: NEGATIVE
Glucose, UA: NEGATIVE
Ketones, UA: NEGATIVE
Nitrite, UA: NEGATIVE
Protein,UA: NEGATIVE
Specific Gravity, UA: 1.025 (ref 1.005–1.030)
Urobilinogen, Ur: 1 mg/dL (ref 0.2–1.0)
pH, UA: 6 (ref 5.0–7.5)

## 2020-07-20 LAB — MICROSCOPIC EXAMINATION
Bacteria, UA: NONE SEEN
Epithelial Cells (non renal): NONE SEEN /hpf (ref 0–10)
Renal Epithel, UA: NONE SEEN /hpf

## 2020-07-20 MED ORDER — CIPROFLOXACIN HCL 500 MG PO TABS
500.0000 mg | ORAL_TABLET | Freq: Once | ORAL | Status: AC
Start: 2020-07-20 — End: 2020-07-20
  Administered 2020-07-20: 500 mg via ORAL

## 2020-07-20 NOTE — Progress Notes (Signed)
Urological Symptom Review  Patient is experiencing the following symptoms: Get up at night to urinate Stream starts and stops Erection problems (male only)   Review of Systems  Gastrointestinal (upper)  : Negative for upper GI symptoms  Gastrointestinal (lower) : Negative for lower GI symptoms  Constitutional : Negative for symptoms  Skin: Negative for skin symptoms  Eyes: Negative for eye symptoms  Ear/Nose/Throat : Negative for Ear/Nose/Throat symptoms  Hematologic/Lymphatic: Negative for Hematologic/Lymphatic symptoms  Cardiovascular : Negative for cardiovascular symptoms  Respiratory : Negative for respiratory symptoms  Endocrine: Negative for endocrine symptoms  Musculoskeletal: Negative for musculoskeletal symptoms  Neurological: Negative for neurological symptoms  Psychologic: Negative for psychiatric symptoms

## 2020-07-23 LAB — CYTOLOGY, URINE

## 2020-07-24 NOTE — Progress Notes (Signed)
His cytology showed atypia so as we had discussed, I am going to need to get him set up to go back and rebiopsy the areas that I saw on cystoscopy before we proceed with further therapy.

## 2020-07-31 ENCOUNTER — Other Ambulatory Visit: Payer: Self-pay | Admitting: Urology

## 2020-08-02 DIAGNOSIS — R519 Headache, unspecified: Secondary | ICD-10-CM | POA: Diagnosis not present

## 2020-08-02 DIAGNOSIS — R059 Cough, unspecified: Secondary | ICD-10-CM | POA: Diagnosis not present

## 2020-08-02 DIAGNOSIS — R0981 Nasal congestion: Secondary | ICD-10-CM | POA: Diagnosis not present

## 2020-08-02 NOTE — Progress Notes (Addendum)
COVID Vaccine Completed:  x2 Date COVID Vaccine completed:  2nd dose April, Booster 11-21 COVID vaccine manufacturer: Pfizer    Moderna   [employer]   PCP - Allyn Kenner, MD Cardiologist - Carlyle Dolly, MD Pulmonologist - Dr. Melvyn Novas  Chest x-ray - 04-10-20 in Epic EKG - 08-06-20 in Epic Stress Test - 08-31-13 in epic ECHO - 06-13-13 in Epic Cardiac Cath -  Pacemaker/ICD device last checked:  Sleep Study -  CPAP -   Fasting Blood Sugar -  Checks Blood Sugar _____ times a day  Blood Thinner Instructions: Aspirin Instructions: Last Dose:  Anesthesia review: Hx of SOB on exertion and murmur.  Patient has pulmonary nodules.  Patient denies fever, chest pain at PAT appointment.  Patient states that he has had nasal congestion and cough x1 week and had a negative Covid test at PCP.  Cough has improved.  States that he has shortness of breath with exertion such as walking fast or running.  Is able to climb a flight of stairs with SOB and is able to perform ADL's without assistance or SOB.   Patient verbalized understanding of instructions that were given to them at the PAT appointment. Patient was also instructed that they will need to review over the PAT instructions again at home before surgery.

## 2020-08-02 NOTE — Patient Instructions (Addendum)
DUE TO COVID-19 ONLY ONE VISITOR IS ALLOWED TO COME WITH YOU AND STAY IN THE WAITING ROOM ONLY DURING PRE OP AND PROCEDURE.   IF YOU WILL BE ADMITTED INTO THE HOSPITAL YOU ARE ALLOWED ONE SUPPORT PERSON DURING VISITATION HOURS ONLY (10AM -8PM)   . The support person may change daily. . The support person must pass our screening, gel in and out, and wear a mask at all times, including in the patient's room. . Patients must also wear a mask when staff or their support person are in the room.   COVID SWAB TESTING MUST BE COMPLETED ON:  Monday, 08-06-20 @ 2:45 PM   [address] [place], [place] [address]  (Must self quarantine after testing. Follow instructions on handout.)        Your procedure is scheduled on: Thursday, 08-09-20   Report to [facility] Main  Entrance    Report to admitting at 10:00 AM   Call this number if you have problems the morning of surgery [phone]   Do not eat food :After Midnight.   May have liquids until 9:00 AM day of surgery  CLEAR LIQUID DIET  Foods Allowed                                                                     Foods Excluded  Water, Black Coffee and tea, regular and decaf              liquids that you cannot  Plain Jell-O in any flavor  (No red)                                    see through such as: Fruit ices (not with fruit pulp)                                      milk, soups, orange juice              Iced Popsicles (No red)                                      All solid food                                   Apple juices Sports drinks like Gatorade (No red) Lightly seasoned clear broth or consume(fat free) Sugar, honey syrup    Oral Hygiene is also important to reduce your risk of infection.                                    Remember - BRUSH YOUR TEETH THE MORNING OF SURGERY WITH YOUR REGULAR TOOTHPASTE   Do NOT smoke after Midnight   Take these medicines the morning of surgery with A SIP OF WATER:  Levoxyl,  Claritin, Protonix  You may not have any metal on your body including jewelry, and body piercings             Do not wear  lotions, powders, perfumes/cologne, or deodorant             Men may shave face and neck.   Do not bring valuables to the hospital. [place].   Contacts, dentures or bridgework may not be worn into surgery.    Patients discharged the day of surgery will not be allowed to drive home.               Please read over the following fact sheets you were given: IF YOU HAVE QUESTIONS ABOUT YOUR PRE OP INSTRUCTIONS PLEASE CALL [phone]   [place] - Preparing for Surgery Before surgery, you can play an important role.  Because skin is not sterile, your skin needs to be as free of germs as possible.  You can reduce the number of germs on your skin by washing with CHG (chlorahexidine gluconate) soap before surgery.  CHG is an antiseptic cleaner which kills germs and bonds with the skin to continue killing germs even after washing. Please DO NOT use if you have an allergy to CHG or antibacterial soaps.  If your skin becomes reddened/irritated stop using the CHG and inform your nurse when you arrive at Short Stay. Do not shave (including legs and underarms) for at least 48 hours prior to the first CHG shower.  You may shave your face/neck.  Please follow these instructions carefully:  1.  Shower with CHG Soap the night before surgery and the  morning of surgery.  2.  If you choose to wash your hair, wash your hair first as usual with your normal  shampoo.  3.  After you shampoo, rinse your hair and body thoroughly to remove the shampoo.                             4.  Use CHG as you would any other liquid soap.  You can apply chg directly to the skin and wash.  Gently with a scrungie or clean washcloth.  5.  Apply the CHG Soap to your body ONLY FROM THE NECK DOWN.   Do   not use on face/ open                           Wound or  open sores. Avoid contact with eyes, ears mouth and   genitals (private parts).                       Wash face,  Genitals (private parts) with your normal soap.             6.  Wash thoroughly, paying special attention to the area where your    surgery  will be performed.  7.  Thoroughly rinse your body with warm water from the neck down.  8.  DO NOT shower/wash with your normal soap after using and rinsing off the CHG Soap.                9.  Pat yourself dry with a clean towel.            10.  Wear clean pajamas.  11.  Place clean sheets on your bed the night of your first shower and do not  sleep with pets. Day of Surgery : Do not apply any lotions/deodorants the morning of surgery.  Please wear clean clothes to the hospital/surgery center.  FAILURE TO FOLLOW THESE INSTRUCTIONS MAY RESULT IN THE CANCELLATION OF YOUR SURGERY  PATIENT SIGNATURE_________________________________  NURSE SIGNATURE__________________________________  ________________________________________________________________________

## 2020-08-03 ENCOUNTER — Ambulatory Visit: Payer: Medicare Other

## 2020-08-06 ENCOUNTER — Encounter (HOSPITAL_COMMUNITY)
Admission: RE | Admit: 2020-08-06 | Discharge: 2020-08-06 | Disposition: A | Discharge status: Home / Self Care | Payer: Medicare Other | Source: Ambulatory Visit | Attending: Urology | Admitting: Urology

## 2020-08-06 ENCOUNTER — Other Ambulatory Visit: Payer: Self-pay

## 2020-08-06 ENCOUNTER — Other Ambulatory Visit (HOSPITAL_COMMUNITY)
Admission: RE | Admit: 2020-08-06 | Discharge: 2020-08-06 | Disposition: A | Discharge status: Home / Self Care | Payer: Medicare Other | Source: Ambulatory Visit | Attending: Urology | Admitting: Urology

## 2020-08-06 ENCOUNTER — Encounter (HOSPITAL_COMMUNITY): Payer: Self-pay

## 2020-08-06 DIAGNOSIS — Z20822 Contact with and (suspected) exposure to covid-19: Secondary | ICD-10-CM | POA: Insufficient documentation

## 2020-08-06 DIAGNOSIS — Z01818 Encounter for other preprocedural examination: Secondary | ICD-10-CM | POA: Diagnosis not present

## 2020-08-06 DIAGNOSIS — I10 Essential (primary) hypertension: Secondary | ICD-10-CM | POA: Diagnosis not present

## 2020-08-06 DIAGNOSIS — Z01812 Encounter for preprocedural laboratory examination: Secondary | ICD-10-CM | POA: Insufficient documentation

## 2020-08-06 HISTORY — DX: Dyspnea, unspecified: R06.00

## 2020-08-06 HISTORY — DX: Other nonspecific abnormal finding of lung field: R91.8

## 2020-08-06 HISTORY — DX: Anemia, unspecified: D64.9

## 2020-08-06 LAB — CBC
HCT: 48.1 % (ref 39.0–52.0)
Hemoglobin: 16.6 g/dL (ref 13.0–17.0)
MCH: 29.8 pg (ref 26.0–34.0)
MCHC: 34.5 g/dL (ref 30.0–36.0)
MCV: 86.4 fL (ref 80.0–100.0)
Platelets: 173 10*3/uL (ref 150–400)
RBC: 5.57 MIL/uL (ref 4.22–5.81)
RDW: 12.5 % (ref 11.5–15.5)
WBC: 5 10*3/uL (ref 4.0–10.5)
nRBC: 0 % (ref 0.0–0.2)

## 2020-08-06 LAB — BASIC METABOLIC PANEL
Anion gap: 12 (ref 5–15)
BUN: 24 mg/dL — ABNORMAL HIGH (ref 8–23)
CO2: 25 mmol/L (ref 22–32)
Calcium: 9.9 mg/dL (ref 8.9–10.3)
Chloride: 99 mmol/L (ref 98–111)
Creatinine, Ser: 1.29 mg/dL — ABNORMAL HIGH (ref 0.61–1.24)
GFR, Estimated: >60 mL/min (ref >60)
Glucose, Bld: 115 mg/dL — ABNORMAL HIGH (ref 70–99)
Potassium: 5.3 mmol/L — ABNORMAL HIGH (ref 3.5–5.1)
Sodium: 136 mmol/L (ref 135–145)

## 2020-08-06 LAB — SARS CORONAVIRUS 2 (TAT 6-24 HRS): SARS Coronavirus 2: NEGATIVE

## 2020-08-09 ENCOUNTER — Ambulatory Visit (HOSPITAL_COMMUNITY): Payer: Medicare Other

## 2020-08-09 ENCOUNTER — Ambulatory Visit (HOSPITAL_COMMUNITY): Payer: Medicare Other | Admitting: Physician Assistant

## 2020-08-09 ENCOUNTER — Ambulatory Visit (HOSPITAL_COMMUNITY)
Admission: RE | Admit: 2020-08-09 | Discharge: 2020-08-09 | Disposition: A | Discharge status: Home / Self Care | Payer: Medicare Other | Attending: Urology | Admitting: Urology

## 2020-08-09 ENCOUNTER — Encounter (HOSPITAL_COMMUNITY)
Admission: RE | Disposition: A | Discharge status: Home / Self Care | Payer: Self-pay | Source: Home / Self Care | Attending: Urology

## 2020-08-09 ENCOUNTER — Encounter (HOSPITAL_COMMUNITY): Payer: Self-pay | Admitting: Urology

## 2020-08-09 ENCOUNTER — Ambulatory Visit (HOSPITAL_COMMUNITY): Payer: Medicare Other | Admitting: Anesthesiology

## 2020-08-09 DIAGNOSIS — Z881 Allergy status to other antibiotic agents status: Secondary | ICD-10-CM | POA: Diagnosis not present

## 2020-08-09 DIAGNOSIS — N411 Chronic prostatitis: Secondary | ICD-10-CM | POA: Diagnosis not present

## 2020-08-09 DIAGNOSIS — Z79890 Hormone replacement therapy (postmenopausal): Secondary | ICD-10-CM | POA: Insufficient documentation

## 2020-08-09 DIAGNOSIS — Z880 Allergy status to penicillin: Secondary | ICD-10-CM | POA: Insufficient documentation

## 2020-08-09 DIAGNOSIS — E039 Hypothyroidism, unspecified: Secondary | ICD-10-CM | POA: Diagnosis not present

## 2020-08-09 DIAGNOSIS — Z79899 Other long term (current) drug therapy: Secondary | ICD-10-CM | POA: Insufficient documentation

## 2020-08-09 DIAGNOSIS — Z882 Allergy status to sulfonamides status: Secondary | ICD-10-CM | POA: Diagnosis not present

## 2020-08-09 DIAGNOSIS — N3289 Other specified disorders of bladder: Secondary | ICD-10-CM | POA: Diagnosis not present

## 2020-08-09 DIAGNOSIS — D090 Carcinoma in situ of bladder: Secondary | ICD-10-CM | POA: Diagnosis not present

## 2020-08-09 DIAGNOSIS — Z8551 Personal history of malignant neoplasm of bladder: Secondary | ICD-10-CM | POA: Diagnosis not present

## 2020-08-09 DIAGNOSIS — N329 Bladder disorder, unspecified: Secondary | ICD-10-CM | POA: Insufficient documentation

## 2020-08-09 DIAGNOSIS — N400 Enlarged prostate without lower urinary tract symptoms: Secondary | ICD-10-CM | POA: Diagnosis not present

## 2020-08-09 DIAGNOSIS — D494 Neoplasm of unspecified behavior of bladder: Secondary | ICD-10-CM | POA: Diagnosis not present

## 2020-08-09 HISTORY — PX: CYSTOSCOPY WITH BIOPSY: SHX5122

## 2020-08-09 SURGERY — CYSTOSCOPY, WITH BIOPSY
Anesthesia: General | Laterality: Bilateral

## 2020-08-09 MED ORDER — ORAL CARE MOUTH RINSE: 15.0000 mL | Freq: Once | OROMUCOSAL | Status: AC

## 2020-08-09 MED ORDER — ONDANSETRON HCL 4 MG/2ML IJ SOLN: 4.0000 mg | Freq: Once | INTRAMUSCULAR | Status: DC | PRN

## 2020-08-09 MED ORDER — FENTANYL CITRATE (PF) 100 MCG/2ML IJ SOLN
INTRAMUSCULAR | Status: AC
  Filled 2020-08-09: qty 2

## 2020-08-09 MED ORDER — MEPERIDINE HCL 50 MG/ML IJ SOLN: 6.2500 mg | INTRAMUSCULAR | Status: DC | PRN

## 2020-08-09 MED ORDER — LIDOCAINE HCL (PF) 2 % IJ SOLN
INTRAMUSCULAR | Status: AC
  Filled 2020-08-09: qty 5

## 2020-08-09 MED ORDER — OXYCODONE HCL 5 MG PO TABS: 5.0000 mg | ORAL_TABLET | Freq: Once | ORAL | Status: DC | PRN

## 2020-08-09 MED ORDER — SODIUM CHLORIDE 0.9 % IR SOLN
Status: DC | PRN
  Administered 2020-08-09: 3000 mL via INTRAVESICAL

## 2020-08-09 MED ORDER — PHENYLEPHRINE 40 MCG/ML (10ML) SYRINGE FOR IV PUSH (FOR BLOOD PRESSURE SUPPORT)
PREFILLED_SYRINGE | INTRAVENOUS | Status: AC
  Filled 2020-08-09: qty 20

## 2020-08-09 MED ORDER — MIDAZOLAM HCL 2 MG/2ML IJ SOLN
INTRAMUSCULAR | Status: AC
  Filled 2020-08-09: qty 2

## 2020-08-09 MED ORDER — ACETAMINOPHEN 325 MG PO TABS: 650.0000 mg | ORAL_TABLET | ORAL | Status: DC | PRN

## 2020-08-09 MED ORDER — MIDAZOLAM HCL 2 MG/2ML IJ SOLN
INTRAMUSCULAR | Status: DC | PRN
  Administered 2020-08-09: 2 mg via INTRAVENOUS

## 2020-08-09 MED ORDER — ACETAMINOPHEN 325 MG PO TABS: 325.0000 mg | ORAL_TABLET | ORAL | Status: DC | PRN

## 2020-08-09 MED ORDER — FENTANYL CITRATE (PF) 100 MCG/2ML IJ SOLN
25.0000 ug | INTRAMUSCULAR | Status: DC | PRN
  Administered 2020-08-09: 50 ug via INTRAVENOUS

## 2020-08-09 MED ORDER — OXYCODONE HCL 5 MG PO TABS: 5.0000 mg | ORAL_TABLET | ORAL | Status: DC | PRN

## 2020-08-09 MED ORDER — LACTATED RINGERS IV SOLN: INTRAVENOUS | Status: DC

## 2020-08-09 MED ORDER — SODIUM CHLORIDE 0.9% FLUSH: 3.0000 mL | Freq: Two times a day (BID) | INTRAVENOUS | Status: DC

## 2020-08-09 MED ORDER — PROPOFOL 10 MG/ML IV BOLUS
INTRAVENOUS | Status: DC | PRN
  Administered 2020-08-09: 150 mg via INTRAVENOUS

## 2020-08-09 MED ORDER — PROPOFOL 10 MG/ML IV BOLUS
INTRAVENOUS | Status: AC
  Filled 2020-08-09: qty 20

## 2020-08-09 MED ORDER — ONDANSETRON HCL 4 MG/2ML IJ SOLN
INTRAMUSCULAR | Status: DC | PRN
  Administered 2020-08-09: 4 mg via INTRAVENOUS

## 2020-08-09 MED ORDER — PHENYLEPHRINE 40 MCG/ML (10ML) SYRINGE FOR IV PUSH (FOR BLOOD PRESSURE SUPPORT)
PREFILLED_SYRINGE | INTRAVENOUS | Status: AC
  Filled 2020-08-09: qty 10

## 2020-08-09 MED ORDER — SODIUM CHLORIDE 0.9 % IV SOLN: 250.0000 mL | INTRAVENOUS | Status: DC | PRN

## 2020-08-09 MED ORDER — SODIUM CHLORIDE 0.9% FLUSH: 3.0000 mL | INTRAVENOUS | Status: DC | PRN

## 2020-08-09 MED ORDER — STERILE WATER FOR IRRIGATION IR SOLN
Status: DC | PRN
  Administered 2020-08-09 (×2): 3000 mL

## 2020-08-09 MED ORDER — FENTANYL CITRATE (PF) 100 MCG/2ML IJ SOLN: 25.0000 ug | INTRAMUSCULAR | Status: DC | PRN

## 2020-08-09 MED ORDER — IOHEXOL 300 MG/ML  SOLN
INTRAMUSCULAR | Status: DC | PRN
  Administered 2020-08-09: 13 mL via URETHRAL

## 2020-08-09 MED ORDER — OXYCODONE HCL 5 MG/5ML PO SOLN: 5.0000 mg | Freq: Once | ORAL | Status: DC | PRN

## 2020-08-09 MED ORDER — MORPHINE SULFATE (PF) 4 MG/ML IV SOLN: 2.0000 mg | INTRAVENOUS | Status: DC | PRN

## 2020-08-09 MED ORDER — ACETAMINOPHEN 650 MG RE SUPP: 650.0000 mg | RECTAL | Status: DC | PRN

## 2020-08-09 MED ORDER — DEXAMETHASONE SODIUM PHOSPHATE 10 MG/ML IJ SOLN
INTRAMUSCULAR | Status: DC | PRN
  Administered 2020-08-09: 10 mg via INTRAVENOUS

## 2020-08-09 MED ORDER — LIDOCAINE 2% (20 MG/ML) 5 ML SYRINGE
INTRAMUSCULAR | Status: DC | PRN
  Administered 2020-08-09: 80 mg via INTRAVENOUS

## 2020-08-09 MED ORDER — EPHEDRINE 5 MG/ML INJ
INTRAVENOUS | Status: AC
  Filled 2020-08-09: qty 10

## 2020-08-09 MED ORDER — CHLORHEXIDINE GLUCONATE 0.12 % MT SOLN
15.0000 mL | Freq: Once | OROMUCOSAL | Status: AC
  Administered 2020-08-09: 15 mL via OROMUCOSAL

## 2020-08-09 MED ORDER — CIPROFLOXACIN IN D5W 400 MG/200ML IV SOLN
400.0000 mg | INTRAVENOUS | Status: AC
  Administered 2020-08-09: 400 mg via INTRAVENOUS
  Filled 2020-08-09: qty 200

## 2020-08-09 MED ORDER — FENTANYL CITRATE (PF) 100 MCG/2ML IJ SOLN
INTRAMUSCULAR | Status: DC | PRN
  Administered 2020-08-09 (×2): 25 ug via INTRAVENOUS
  Administered 2020-08-09 (×2): 50 ug via INTRAVENOUS

## 2020-08-09 MED ORDER — ACETAMINOPHEN 160 MG/5ML PO SOLN: 325.0000 mg | ORAL | Status: DC | PRN

## 2020-08-09 SURGICAL SUPPLY — 19 items
BAG DRN RND TRDRP ANRFLXCHMBR (UROLOGICAL SUPPLIES)
BAG URINE DRAIN 2000ML AR STRL (UROLOGICAL SUPPLIES) IMPLANT
BAG URO CATCHER STRL LF (MISCELLANEOUS) ×3 IMPLANT
CATH FOLEY 2WAY 5CC 20FR (CATHETERS) ×1 IMPLANT
CATH FOLEY 3WAY 30CC 22FR (CATHETERS) IMPLANT
CATH URET 5FR 28IN OPEN ENDED (CATHETERS) ×1 IMPLANT
GLOVE SURG SS PI 8.0 STRL IVOR (GLOVE) IMPLANT
GOWN STRL REUS W/TWL XL LVL3 (GOWN DISPOSABLE) ×2 IMPLANT
HOLDER FOLEY CATH W/STRAP (MISCELLANEOUS) ×1 IMPLANT
KIT TURNOVER KIT A (KITS) ×1 IMPLANT
LOOP CUT BIPOLAR 24F LRG (ELECTROSURGICAL) IMPLANT
MANIFOLD NEPTUNE II (INSTRUMENTS) ×2 IMPLANT
PACK CYSTO (CUSTOM PROCEDURE TRAY) ×2 IMPLANT
SUT ETHILON 3 0 PS 1 (SUTURE) IMPLANT
SYR 30ML LL (SYRINGE) IMPLANT
SYR TOOMEY IRRIG 70ML (MISCELLANEOUS) ×2
SYRINGE TOOMEY IRRIG 70ML (MISCELLANEOUS) IMPLANT
TUBING CONNECTING 10 (TUBING) ×2 IMPLANT
TUBING UROLOGY SET (TUBING) ×2 IMPLANT

## 2020-08-09 NOTE — Transfer of Care (Signed)
Immediate Anesthesia Transfer of Care Note  Patient: Isaac Guerrero  Procedure(s) Performed: CYSTOSCOPY WITH BLADDER BIOPSYAND FULGURATION BILATERAL RETROGRADE S (Bilateral )  Patient Location: PACU  Anesthesia Type:General  Level of Consciousness: awake, alert  and oriented  Airway & Oxygen Therapy: Patient Spontanous Breathing and Patient connected to face mask oxygen  Post-op Assessment: Report given to RN and Post -op Vital signs reviewed and stable  Post vital signs: Reviewed and stable  Last Vitals:  Vitals Value Taken Time  BP 151/120 08/09/20 1345  Temp    Pulse 99 08/09/20 1345  Resp 13 08/09/20 1345  SpO2 100 % 08/09/20 1345  Vitals shown include unvalidated device data.  Last Pain:  Vitals:   08/09/20 1018  TempSrc:   PainSc: 0-No pain         Complications: No complications documented.

## 2020-08-09 NOTE — Op Note (Signed)
Procedure: 1. Cystoscopy with bilateral retrograde pyelography and interpretation. 2. Bladder biopsy and fulguration of 2 x 3 cm bladder lesion on the dome. 3. Biopsy of right lateral wall lesion. 4. Fulguration of left bladder neck lesion. 5. Biopsy of posterior wall lesion.  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  Postop diagnosis: Same.  Surgeon: Dr. Irine Seal.  Anesthesia: General.  Specimen: Bladder biopsies from the dome, posterior wall and right lateral wall.  Drains: 20 French Foley catheter.  EBL: None.  Complications: None.  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 At his first surveillance cystoscopy following induction he was noted to have some patchy erythema of the dome of the bladder and had atypia on cytology so it was felt that biopsy was indicated.  Procedure: He was taken operating room where he was given preoperative antibiotics. A general anesthetic was induced. He was placed in lithotomy position and fitted with PAS hose. His perineum and genitalia were prepped with Betadine solution he was draped in usual sterile fashion.  Cystoscopy was performed using a 23 [place] scope and 30 degree lens. Examination revealed a normal urethra. The external sphincter was intact. The prostatic urethra bilobar hyperplasia with only mild coaptation. The bladder wall had mild trabeculation. There was erythematous mucosa on the dome and an area of approximately 2 x 3 cm. There were some dystrophic calcifications at prior biopsy sites on the left bladder neck, right lateral wall and dome. No obvious papillary tumors were identified. Ureteral orifices were unremarkable.  Bilateral retrograde pyelography was performed using a 5 French opening catheter and Omnipaque.  The left retrograde pyelogram revealed a normal ureter and  intrarenal collecting system.  The right retrograde pyelogram demonstrated a normal ureter and intrarenal collecting system.  The cystoscope was then fitted with a rigid biopsy forceps and biopsies were obtained from the dome and 2 areas, the posterior wall and the right lateral wall.  The biopsy sites were then fulgurated with the Bugbee electrode and the patchy erythema on the dome was generously fulgurated to destroy all abnormal appearing mucosa and area of approximately 2 x 3 cm.  Once hemostasis was achieved final inspection revealed no evidence of bladder wall perforation and no active bleeding from the biopsy sites but he did have fairly significant oozing generally from the mucosa from fragile capillaries. It was felt that a Foley catheter was indicated. A 20 French Foley catheter was inserted and the balloon was filled with 10 mL of sterile fluid. The catheter was irrigated with very light pink return and placed to straight drainage. He was taken down from lithotomy position and moved to recovery in stable condition. There were no complications.

## 2020-08-09 NOTE — Discharge Instructions (Signed)
Indwelling Urinary Catheter Care, Adult An indwelling urinary catheter is a thin tube that is put into your bladder. The tube helps to drain pee (urine) out of your body. The tube goes in through your urethra. Your urethra is where pee comes out of your body. Your pee will come out through the catheter, then it will go into a bag (drainage bag). Take good care of your catheter so it will work well. How to wear your catheter and bag Supplies needed  Sticky tape (adhesive tape) or a leg strap.  Alcohol wipe or soap and water (if you use tape).  A clean towel (if you use tape).  Large overnight bag.  Smaller bag (leg bag). Wearing your catheter Attach your catheter to your leg with tape or a leg strap.  Make sure the catheter is not pulled tight.  If a leg strap gets wet, take it off and put on a dry strap.  If you use tape to hold the bag on your leg: 1. Use an alcohol wipe or soap and water to wash your skin where the tape made it sticky before. 2. Use a clean towel to pat-dry that skin. 3. Use new tape to make the bag stay on your leg. Wearing your bags You should have been given a large overnight bag.  You may wear the overnight bag in the day or night.  Always have the overnight bag lower than your bladder.  Do not let the bag touch the floor.  Before you go to sleep, put a clean plastic bag in a wastebasket. Then hang the overnight bag inside the wastebasket. You should also have a smaller leg bag that fits under your clothes.  Always wear the leg bag below your knee.  Do not wear your leg bag at night. How to care for your skin and catheter Supplies needed  A clean washcloth.  Water and mild soap.  A clean towel. Caring for your skin and catheter      Clean the skin around your catheter every day: 1. Wash your hands with soap and water. 2. Wet a clean washcloth in warm water and mild soap. 3. Clean the skin around your urethra.  If you are  male:  Gently spread the folds of skin around your vagina (labia).  With the washcloth in your other hand, wipe the inner side of your labia on each side. Wipe from front to back.  If you are male:  Pull back any skin that covers the end of your penis (foreskin).  With the washcloth in your other hand, wipe your penis in small circles. Start wiping at the tip of your penis, then move away from the catheter.  Move the foreskin back in place, if needed. 4. With your free hand, hold the catheter close to where it goes into your body.  Keep holding the catheter during cleaning so it does not get pulled out. 5. With the washcloth in your other hand, clean the catheter.  Only wipe downward on the catheter.  Do not wipe upward toward your body. Doing this may push germs into your urethra and cause infection. 6. Use a clean towel to pat-dry the catheter and the skin around it. Make sure to wipe off all soap. 7. Wash your hands with soap and water.  Shower every day. Do not take baths.  Do not use cream, ointment, or lotion on the area where the catheter goes into your body, unless your doctor tells you   to.  Do not use powders, sprays, or lotions on your genital area.  Check your skin around the catheter every day for signs of infection. Check for: ? Redness, swelling, or pain. ? Fluid or blood. ? Warmth. ? Pus or a bad smell. How to empty the bag Supplies needed  Rubbing alcohol.  Gauze pad or cotton ball.  Tape or a leg strap. Emptying the bag Pour the pee out of your bag when it is ?- full, or at least 2-3 times a day. Do this for your overnight bag and your leg bag. 1. Wash your hands with soap and water. 2. Separate (detach) the bag from your leg. 3. Hold the bag over the toilet or a clean pail. Keep the bag lower than your hips and bladder. This is so the pee (urine) does not go back into the tube. 4. Open the pour spout. It is at the bottom of the bag. 5. Empty the  pee into the toilet or pail. Do not let the pour spout touch any surface. 6. Put rubbing alcohol on a gauze pad or cotton ball. 7. Use the gauze pad or cotton ball to clean the pour spout. 8. Close the pour spout. 9. Attach the bag to your leg with tape or a leg strap. 10. Wash your hands with soap and water. Follow instructions for cleaning the drainage bag:  From the product maker.  As told by your doctor. How to change the bag Supplies needed  Alcohol wipes.  A clean bag.  Tape or a leg strap. Changing the bag Replace your bag when it starts to leak, smell bad, or look dirty. 1. Wash your hands with soap and water. 2. Separate the dirty bag from your leg. 3. Pinch the catheter with your fingers so that pee does not spill out. 4. Separate the catheter tube from the bag tube where these tubes connect (at the connection valve). Do not let the tubes touch any surface. 5. Clean the end of the catheter tube with an alcohol wipe. Use a different alcohol wipe to clean the end of the bag tube. 6. Connect the catheter tube to the tube of the clean bag. 7. Attach the clean bag to your leg with tape or a leg strap. Do not make the bag tight on your leg. 8. Wash your hands with soap and water. General rules   Never pull on your catheter. Never try to take it out. Doing that can hurt you.  Always wash your hands before and after you touch your catheter or bag. Use a mild, fragrance-free soap. If you do not have soap and water, use hand sanitizer.  Always make sure there are no twists or bends (kinks) in the catheter tube.  Always make sure there are no leaks in the catheter or bag.  Drink enough fluid to keep your pee pale yellow.  Do not take baths, swim, or use a hot tub.  If you are male, wipe from front to back after you poop (have a bowel movement). Contact a doctor if:  Your pee is cloudy.  Your pee smells worse than usual.  Your catheter gets clogged.  Your catheter  leaks.  Your bladder feels full. Get help right away if:  You have redness, swelling, or pain where the catheter goes into your body.  You have fluid, blood, pus, or a bad smell coming from the area where the catheter goes into your body.  Your skin feels warm where   the catheter goes into your body.  You have a fever.  You have pain in your: ? Belly (abdomen). ? Legs. ? Lower back. ? Bladder.  You see blood in the catheter.  Your pee is pink or red.  You feel sick to your stomach (nauseous).  You throw up (vomit).  You have chills.  Your pee is not draining into the bag.  Your catheter gets pulled out. Summary  An indwelling urinary catheter is a thin tube that is placed into the bladder to help drain pee (urine) out of the body.  The catheter is placed into the part of the body that drains pee from the bladder (urethra).  Taking good care of your catheter will keep it working properly and help prevent problems.  Always wash your hands before and after touching your catheter or bag.  Never pull on your catheter or try to take it out.  You may remove the catheter in the morning by cutting off the side arm with the yellow nipple.  It should slide out easily after the fluid drains.   This information is not intended to replace advice given to you by your health care provider. Make sure you discuss any questions you have with your health care provider. Document Revised: 12/10/2018 Document Reviewed: 04/03/2017 Elsevier Patient Education  [name].

## 2020-08-09 NOTE — Interval H&P Note (Signed)
History and Physical Interval Note:  cytology had atypia and he had a lesion on the bladder wall that needs a biopsy.   08/09/2020 12:33 PM  Isaac Guerrero  has presented today for surgery, with the diagnosis of HISTORY OF BLADDER CANCER.  The various methods of treatment have been discussed with the patient and family. After consideration of risks, benefits and other options for treatment, the patient has consented to  Procedure(s): CYSTOSCOPY WITH BLADDER BIOPSYAND FULGURATION BILATERAL RETROGRADE S POSSIBLE TRANSURETHRAL RESECTION OF BLADDER TUMOR (Bilateral) as a surgical intervention.  The patient's history has been reviewed, patient examined, no change in status, stable for surgery.  I have reviewed the patient's chart and labs.  Questions were answered to the patient's satisfaction.     Irine Seal

## 2020-08-09 NOTE — Anesthesia Preprocedure Evaluation (Signed)
Anesthesia Evaluation  Patient identified by MRN, date of birth, ID band Patient awake    Reviewed: Allergy & Precautions, NPO status , Patient's Chart, lab work & pertinent test results  Airway Mallampati: II  TM Distance: >3 FB Neck ROM: Full    Dental no notable dental hx. (+) Teeth Intact, Dental Advisory Given   Pulmonary neg pulmonary ROS,    Pulmonary exam normal breath sounds clear to auscultation       Cardiovascular hypertension, Pt. on medications + DOE  Normal cardiovascular exam Rhythm:Regular Rate:Normal     Neuro/Psych negative neurological ROS  negative psych ROS   GI/Hepatic Neg liver ROS, GERD  Medicated,  Endo/Other  Hypothyroidism   Renal/GU negative Renal ROS  negative genitourinary   Musculoskeletal  (+) Arthritis , Osteoarthritis,    Abdominal   Peds negative pediatric ROS (+)  Hematology  (+) Blood dyscrasia, anemia ,   Anesthesia Other Findings   Reproductive/Obstetrics negative OB ROS                             Anesthesia Physical  Anesthesia Plan  ASA: III  Anesthesia Plan: General   Post-op Pain Management:    Induction: Intravenous  PONV Risk Score and Plan: 2 and Ondansetron, Dexamethasone and Treatment may vary due to age or medical condition  Airway Management Planned: LMA and Oral ETT  Additional Equipment:   Intra-op Plan:   Post-operative Plan: Extubation in OR  Informed Consent: I have reviewed the patients History and Physical, chart, labs and discussed the procedure including the risks, benefits and alternatives for the proposed anesthesia with the patient or authorized representative who has indicated his/her understanding and acceptance.     Dental advisory given  Plan Discussed with: CRNA, Surgeon and Anesthesiologist  Anesthesia Plan Comments:         Anesthesia Quick Evaluation

## 2020-08-09 NOTE — Anesthesia Procedure Notes (Signed)
Procedure Name: LMA Insertion Date/Time: 08/09/2020 12:57 PM Performed by: [place] Mairany Bruno C, CRNA Pre-anesthesia Checklist: Patient identified, Emergency Drugs available, Suction available and Patient being monitored Patient Re-evaluated:Patient Re-evaluated prior to induction Oxygen Delivery Method: Circle system utilized Preoxygenation: Pre-oxygenation with 100% oxygen Induction Type: IV induction Ventilation: Mask ventilation without difficulty LMA: LMA inserted LMA Size: 4.0 Number of attempts: 1 Airway Equipment and Method: Bite block Placement Confirmation: positive ETCO2 Tube secured with: Tape Dental Injury: Teeth and Oropharynx as per pre-operative assessment

## 2020-08-10 ENCOUNTER — Ambulatory Visit: Payer: Medicare Other

## 2020-08-10 ENCOUNTER — Encounter (HOSPITAL_COMMUNITY): Payer: Self-pay | Admitting: Urology

## 2020-08-12 LAB — SURGICAL PATHOLOGY

## 2020-08-17 ENCOUNTER — Ambulatory Visit: Payer: Medicare Other

## 2020-08-19 NOTE — Anesthesia Postprocedure Evaluation (Signed)
Anesthesia Post Note  Patient: Tina Griffiths  Procedure(s) Performed: CYSTOSCOPY WITH BLADDER BIOPSYAND FULGURATION BILATERAL RETROGRADE S (Bilateral )     Patient location during evaluation: PACU Anesthesia Type: General Level of consciousness: awake and alert Pain management: pain level controlled Vital Signs Assessment: post-procedure vital signs reviewed and stable Respiratory status: spontaneous breathing, nonlabored ventilation, respiratory function stable and patient connected to nasal cannula oxygen Cardiovascular status: blood pressure returned to baseline and stable Postop Assessment: no apparent nausea or vomiting Anesthetic complications: no   No complications documented.  Last Vitals:  Vitals:   08/09/20 1500 08/09/20 1530  BP: (!) 142/85 (!) 178/89  Pulse: 81 92  Resp: 13   Temp:    SpO2: 93% 92%    Last Pain:  Vitals:   08/09/20 1530  TempSrc:   PainSc: 3                  Lasundra Hascall

## 2020-08-20 DIAGNOSIS — R059 Cough, unspecified: Secondary | ICD-10-CM | POA: Diagnosis not present

## 2020-08-31 DIAGNOSIS — E1169 Type 2 diabetes mellitus with other specified complication: Secondary | ICD-10-CM | POA: Diagnosis not present

## 2020-08-31 DIAGNOSIS — N182 Chronic kidney disease, stage 2 (mild): Secondary | ICD-10-CM | POA: Diagnosis not present

## 2020-08-31 DIAGNOSIS — E782 Mixed hyperlipidemia: Secondary | ICD-10-CM | POA: Diagnosis not present

## 2020-08-31 DIAGNOSIS — E039 Hypothyroidism, unspecified: Secondary | ICD-10-CM | POA: Diagnosis not present

## 2020-09-06 ENCOUNTER — Ambulatory Visit (INDEPENDENT_AMBULATORY_CARE_PROVIDER_SITE_OTHER): Payer: Medicare Other | Admitting: Urology

## 2020-09-06 ENCOUNTER — Encounter: Payer: Self-pay | Admitting: Urology

## 2020-09-06 ENCOUNTER — Other Ambulatory Visit: Payer: Self-pay

## 2020-09-06 VITALS — BP 150/94 | HR 78 | Temp 98.4°F | Ht 70.0 in | Wt 188.0 lb

## 2020-09-06 DIAGNOSIS — D090 Carcinoma in situ of bladder: Secondary | ICD-10-CM

## 2020-09-06 DIAGNOSIS — Z8551 Personal history of malignant neoplasm of bladder: Secondary | ICD-10-CM

## 2020-09-06 MED ORDER — BCG LIVE 50 MG IS SUSR
3.2400 mL | Freq: Once | INTRAVESICAL | Status: AC
  Administered 2020-09-06: 81 mg via INTRAVESICAL

## 2020-09-06 NOTE — Progress Notes (Signed)
Urological Symptom Review  Patient is experiencing the following symptoms: Frequent urination Get up at night to urinate Erection problems (male only)   Review of Systems  Gastrointestinal (upper)  : Negative for upper GI symptoms  Gastrointestinal (lower) : Negative for lower GI symptoms  Constitutional : Negative for symptoms  Skin: Negative for skin symptoms  Eyes: Negative for eye symptoms  Ear/Nose/Throat : Negative for Ear/Nose/Throat symptoms  Hematologic/Lymphatic: Negative for Hematologic/Lymphatic symptoms  Cardiovascular : Negative for cardiovascular symptoms  Respiratory : Negative for respiratory symptoms  Endocrine: Negative for endocrine symptoms  Musculoskeletal: Back pain  Neurological: Negative for neurological symptoms  Psychologic: Negative for psychiatric symptoms  

## 2020-09-06 NOTE — Progress Notes (Signed)
Subjective:  1. History of bladder cancer   2. CIS (carcinoma in situ of bladder)     09/06/20: Isaac Guerrero returns today in f/u from his recent cystoscopy with biopsy and fulguration on 08/09/20.  He was found to have recurrent/persistent CIS in 4/4 biopsy sites on the dome, base and posterior wall of the bladder.  He is having no hematuria or dysuria.  His IPSS is 6.    07/20/20: Isaac Guerrero returns today in f/u for his history of CIS found on TURBT from a left lateral wall lesion on 02/07/20 that was followed by BCG induction treatment that he completed on 04/16/20 in the [place] office. Marland KitchenHe is voiding with reduced LUTS with an IPSS of 9.  He has had no hematuria.   01/16/20: Isaac Guerrero returns today in f/u. His cytology is positive. A CT today showed bilateral renal cysts and prostate enlargement but no bladder lesions or upper tract lesions. His IPSS remains elevated at 24.   01/04/20: Isaac Guerrero returns today in f/u for his LUTS with OAB. He was given a trial of Vesicare on 12/01/19 but didin't tolerate it or notice improvement . His IPSS is 25 with frequency and a sensation of incomplete emptying as primary issues. he has nocturia x 4 and urgency. He has some chronic dysuria and Azo doesn't help. He had no gross hematuria but he has 3-10 RBC's today.   12/01/2019: Seen in the past for ED and balanitis by Dr Jeffie Pollock. He is on Androgel for hypogonadism managed by his primary care physician. Presents today complaining of worsening frequency/urgency and nocturia that has persistently increased since the fall of last year. His PCP has previously trialed him on tamsulosin, Toviaz, and Myrbetriq with patient reporting no notable difference is in voiding symptoms with either of the medications. Not associated with any hesitancy or straining. He tells me tries to drink a lot of water but that seems to only make symptoms worse. Also associated with chronic intermittent burning with urination. He does not have any associated lower back or  flank pain/discomfort. He denies any visible blood in the urine. Not associated with fevers or chills, nausea/vomiting.       ROS:  ROS:  A complete review of systems was performed.  All systems are negative except for pertinent findings as noted.   ROS  Allergies  Allergen Reactions  . Cephalexin Rash  . Penicillins Rash  . Sulfonamide Derivatives Rash    Outpatient Encounter Medications as of 09/06/2020  Medication Sig  . b complex vitamins tablet Take 1 tablet by mouth daily.  . benzonatate (TESSALON) 100 MG capsule Take by mouth 3 (three) times daily as needed for cough.  . Cholecalciferol (VITAMIN D3) 2000 UNITS TABS Take 2,000 Units by mouth daily.  . Coenzyme Q10 200 MG capsule Take 200 mg by mouth daily.  [name] gabapentin (NEURONTIN) 100 MG capsule Take 100 mg by mouth at bedtime.   [name] levothyroxine (SYNTHROID) 88 MCG tablet Take 88 mcg by mouth daily before breakfast.  . loratadine (CLARITIN) 10 MG tablet Take 10 mg by mouth daily.  [name] losartan (COZAAR) 50 MG tablet Take 50 mg by mouth daily.  . meloxicam (MOBIC) 15 MG tablet Take 15 mg by mouth daily as needed for pain.   . pantoprazole (PROTONIX) 40 MG tablet Take 40 mg by mouth daily.  . traMADol (ULTRAM) 50 MG tablet Take 50 mg by mouth every 6 (six) hours as needed for moderate pain.   [name] ZOCOR 40 MG tablet  TAKE ONE TABLET BY MOUTH IN THE EVENING. (Patient taking differently: Take 40 mg by mouth every evening.)  . [EXPIRED] bcg vaccine injection 81 mg    No facility-administered encounter medications on file as of 09/06/2020.    Past Medical History:  Diagnosis Date  . Anemia   . Arthritis   . BPH (benign prostatic hyperplasia) 08/1989  . Dyspnea    with exertion  . GERD (gastroesophageal reflux disease) 11/29/1998  . Hyperlipidemia 08/1997  . Hypertension   . Hypothyroidism 1983  . Lesion of bladder   . Pulmonary nodules   . Spermatocele    per Dr. Annabell Howells with URO    Past Surgical History:  Procedure  Laterality Date  . CATARACT EXTRACTION  03/2003   left eye  . COLONOSCOPY  08/10/09   polyp in ascending colon otherwise normal  . COLONOSCOPY WITH ESOPHAGOGASTRODUODENOSCOPY (EGD) N/A 10/28/2013   Dr. Rourk:Schatzki's ring - not manipulated because no dysphagia currently. Small hiatal hernia.  Gastric and duodenal erosions of uncertain significance-status post biopsy. path with benign findings, no villous atrophy, no H.pylori/TCS:Colonic diverticulosis  . CYSTOSCOPY  08/1989   normal  . CYSTOSCOPY WITH BIOPSY N/A 02/07/2020   Procedure: CYSTOSCOPY WITH BLADDER AND PROSTATIS URETHRAL  BIOPSY WIHT FULGURATION AND INSTILL GEMCITABINE;  Surgeon: Bjorn Pippin, MD;  Location: [facility];  Service: Urology;  Laterality: N/A;  . CYSTOSCOPY WITH BIOPSY Bilateral 08/09/2020   Procedure: CYSTOSCOPY WITH BLADDER BIOPSYAND FULGURATION BILATERAL RETROGRADE S;  Surgeon: Bjorn Pippin, MD;  Location: [facility];  Service: Urology;  Laterality: Bilateral;  . ESOPHAGOGASTRODUODENOSCOPY  10/1999   distal esoph. stricture; sliding H. H.  . LASIK  04/12/2003   right eye  . lumbar microdisectomy  08/2000   Dr. Gerlene Fee    Social History   Socioeconomic History  . Marital status: Married    Spouse name: Not on file  . Number of children: 1  . Years of education: Not on file  . Highest education level: Not on file  Occupational History  . Occupation: Sports administrator: [employer]    Comment: Engineer, water  Tobacco Use  . Smoking status: Never Smoker  . Smokeless tobacco: Never Used  Vaping Use  . Vaping Use: Never used  Substance and Sexual Activity  . Alcohol use: No  . Drug use: No  . Sexual activity: Yes  Other Topics Concern  . Not on file  Social History Narrative   Married and lives with wife   Social Determinants of Health   Financial Resource Strain: Not on file  Food Insecurity: Not on file  Transportation Needs: Not on file  Physical Activity: Not on file   Stress: Not on file  Social Connections: Not on file  Intimate Partner Violence: Not on file    Family History  Problem Relation Age of Onset  . Hypertension Mother   . Hyperlipidemia Mother   . Diabetes Mother 7  . Heart disease Father        CABG X 4  12/2001  . Hyperlipidemia Father   . Hypertension Father   . Heart disease Brother        CABG X 1  . Heart disease Paternal Uncle        MI  . Cancer Maternal Grandfather        colon  . Diabetes Maternal Uncle   . Arthritis Maternal Uncle   . Stroke Paternal Grandfather   . Allergic rhinitis Neg Hx   .  Angioedema Neg Hx   . Asthma Neg Hx   . Atopy Neg Hx   . Eczema Neg Hx   . Immunodeficiency Neg Hx   . Urticaria Neg Hx        Objective: Vitals:   09/06/20 1312  BP: (!) 150/94  Pulse: 78  Temp: 98.4 F (36.9 C)     Physical Exam  Lab Results:  PSA PSA  Date Value Ref Range Status  08/05/2011 0.98 0.10 - 4.00 ng/mL Final  07/22/2010 0.85 0.10 - 4.00 ng/mL Final  03/26/2009 1.03 0.10 - 4.00 ng/mL Final   No results found for: TESTOSTERONE    Studies/Results: No results found for this or any previous visit (from the past 24 hour(s)).  Path: CIS at multiple sites.     Assessment & Plan: Recurrent/Persistent CIS s/p BCG induction.  His options at this time are repeat induction BCG vs intravesical chemotherapy vs cystectomy.   He has elected to proceed with a second induction course of BCG which was initiated today.     OAB.  This has markedly improved with the BCG treatment of the CIS despite the recurrent disease.     Meds ordered this encounter  Medications  . bcg vaccine injection 81 mg     Orders Placed This Encounter  Procedures  . Urinalysis, Routine w reflex microscopic      Return for BCG weekly for 6 weeks total then f/u for cystoscopy 8 weeks after last dose. .   CC: Celene Squibb, MD      Irine Seal 09/06/2020

## 2020-09-06 NOTE — Patient Instructions (Signed)

## 2020-09-06 NOTE — Progress Notes (Signed)
BCG Bladder Instillation  BCG # 1 of 6  Due to Bladder Cancer patient is present today for a BCG treatment. Patient was cleaned and prepped in a sterile fashion with betadine. A 14FR catheter was inserted, urine return was noted 60ml, urine was yellow in color.  81ml of reconstituted BCG was instilled into the bladder. The catheter was then removed. Patient tolerated well, no complications were noted  Preformed by: Sarita Haver, Zailyn Thoennes,LPN  Follow up/ Additional notes: ! wk

## 2020-09-07 LAB — URINALYSIS, ROUTINE W REFLEX MICROSCOPIC
Bilirubin, UA: NEGATIVE
Glucose, UA: NEGATIVE
Ketones, UA: NEGATIVE
Leukocytes,UA: NEGATIVE
Nitrite, UA: NEGATIVE
Protein,UA: NEGATIVE
Specific Gravity, UA: 1.025 (ref 1.005–1.030)
Urobilinogen, Ur: 0.2 mg/dL (ref 0.2–1.0)
pH, UA: 6.5 (ref 5.0–7.5)

## 2020-09-13 ENCOUNTER — Other Ambulatory Visit: Payer: Self-pay

## 2020-09-13 ENCOUNTER — Other Ambulatory Visit: Payer: Self-pay | Admitting: Urology

## 2020-09-13 ENCOUNTER — Ambulatory Visit (INDEPENDENT_AMBULATORY_CARE_PROVIDER_SITE_OTHER): Payer: Medicare Other

## 2020-09-13 DIAGNOSIS — Z8551 Personal history of malignant neoplasm of bladder: Secondary | ICD-10-CM | POA: Diagnosis not present

## 2020-09-13 LAB — MICROSCOPIC EXAMINATION: Renal Epithel, UA: NONE SEEN /hpf

## 2020-09-13 LAB — URINALYSIS, ROUTINE W REFLEX MICROSCOPIC
Bilirubin, UA: NEGATIVE
Glucose, UA: NEGATIVE
Nitrite, UA: NEGATIVE
Specific Gravity, UA: 1.02 (ref 1.005–1.030)
Urobilinogen, Ur: 1 mg/dL (ref 0.2–1.0)
pH, UA: 6 (ref 5.0–7.5)

## 2020-09-13 MED ORDER — BCG LIVE 50 MG IS SUSR
3.2400 mL | Freq: Once | INTRAVESICAL | Status: AC
  Administered 2020-09-13: 81 mg via INTRAVESICAL

## 2020-09-13 NOTE — Addendum Note (Signed)
Addended byIris Pert on: 09/13/2020 05:08 PM   Modules accepted: Orders

## 2020-09-13 NOTE — Addendum Note (Signed)
Addended byIris Pert on: 09/13/2020 05:27 PM   Modules accepted: Orders

## 2020-09-13 NOTE — Progress Notes (Signed)
BCG Bladder Instillation  BCG # 2  Due to Bladder Cancer patient is present today for a BCG treatment. Patient was cleaned and prepped in a sterile fashion with betadine. A 14FR catheter was inserted, urine return was noted 68ml, urine was yellow in color.  96ml of reconstituted BCG was instilled into the bladder. The catheter was then removed. Patient tolerated well, no complications were noted  Performed by: Vegas Fritze,lpn  Follow up/ Additional notes: Keep next scheduled NV

## 2020-09-13 NOTE — Patient Instructions (Signed)

## 2020-09-15 LAB — URINE CULTURE: Organism ID, Bacteria: NO GROWTH

## 2020-09-19 ENCOUNTER — Ambulatory Visit (INDEPENDENT_AMBULATORY_CARE_PROVIDER_SITE_OTHER): Payer: Medicare Other

## 2020-09-19 ENCOUNTER — Other Ambulatory Visit: Payer: Self-pay

## 2020-09-19 DIAGNOSIS — D090 Carcinoma in situ of bladder: Secondary | ICD-10-CM

## 2020-09-19 LAB — URINALYSIS, ROUTINE W REFLEX MICROSCOPIC
Bilirubin, UA: NEGATIVE
Glucose, UA: NEGATIVE
Nitrite, UA: NEGATIVE
Specific Gravity, UA: 1.02 (ref 1.005–1.030)
Urobilinogen, Ur: 1 mg/dL (ref 0.2–1.0)
pH, UA: 5 (ref 5.0–7.5)

## 2020-09-19 LAB — MICROSCOPIC EXAMINATION
Epithelial Cells (non renal): NONE SEEN /hpf (ref 0–10)
Renal Epithel, UA: NONE SEEN /hpf
WBC, UA: >30 /hpf — AB (ref 0–5)

## 2020-09-19 MED ORDER — BCG LIVE 50 MG IS SUSR
3.2400 mL | Freq: Once | INTRAVESICAL | Status: AC
  Administered 2020-09-19: 81 mg via INTRAVESICAL

## 2020-09-19 NOTE — Progress Notes (Signed)
BCG Bladder Instillation  BCG # 3 of 6  Due to Bladder Cancer patient is present today for a BCG treatment. Patient was cleaned and prepped in a sterile fashion with betadine. A 14FR catheter was inserted, urine return was noted 57ml, urine was yellow in color.  55ml of reconstituted BCG was instilled into the bladder. The catheter was then removed. Patient tolerated well, no complications were noted  Preformed by: Eryn Krejci,LPN  Follow up/ Additional notes: 1 wk

## 2020-09-24 DIAGNOSIS — I10 Essential (primary) hypertension: Secondary | ICD-10-CM | POA: Diagnosis not present

## 2020-09-24 DIAGNOSIS — S76311A Strain of muscle, fascia and tendon of the posterior muscle group at thigh level, right thigh, initial encounter: Secondary | ICD-10-CM | POA: Diagnosis not present

## 2020-09-24 DIAGNOSIS — J019 Acute sinusitis, unspecified: Secondary | ICD-10-CM | POA: Diagnosis not present

## 2020-09-24 DIAGNOSIS — J060 Acute laryngopharyngitis: Secondary | ICD-10-CM | POA: Diagnosis not present

## 2020-09-26 ENCOUNTER — Ambulatory Visit (INDEPENDENT_AMBULATORY_CARE_PROVIDER_SITE_OTHER): Payer: Medicare Other

## 2020-09-26 ENCOUNTER — Other Ambulatory Visit: Payer: Self-pay

## 2020-09-26 DIAGNOSIS — D090 Carcinoma in situ of bladder: Secondary | ICD-10-CM | POA: Diagnosis not present

## 2020-09-26 LAB — URINALYSIS, ROUTINE W REFLEX MICROSCOPIC
Bilirubin, UA: NEGATIVE
Glucose, UA: NEGATIVE
Nitrite, UA: NEGATIVE
Specific Gravity, UA: 1.02 (ref 1.005–1.030)
Urobilinogen, Ur: 1 mg/dL (ref 0.2–1.0)
pH, UA: 6 (ref 5.0–7.5)

## 2020-09-26 LAB — MICROSCOPIC EXAMINATION
Renal Epithel, UA: NONE SEEN /hpf
WBC, UA: >30 /hpf — AB (ref 0–5)

## 2020-09-26 MED ORDER — BCG LIVE 50 MG IS SUSR
3.2400 mL | Freq: Once | INTRAVESICAL | Status: AC
  Administered 2020-09-26: 81 mg via INTRAVESICAL

## 2020-09-26 NOTE — Progress Notes (Signed)
BCG Bladder Instillation  BCG # 4  Due to Bladder Cancer patient is present today for a BCG treatment. Patient was cleaned and prepped in a sterile fashion with betadine. A 14FR catheter was inserted, urine return was noted 94ml, urine was yellow in color.  40ml of reconstituted BCG was instilled into the bladder. The catheter was then removed. Patient tolerated well, no complications were noted  Preformed by: Park Pope  RN   Follow up/ Additional notes: 1 week nurse visit

## 2020-09-27 DIAGNOSIS — N182 Chronic kidney disease, stage 2 (mild): Secondary | ICD-10-CM | POA: Diagnosis not present

## 2020-09-27 DIAGNOSIS — R945 Abnormal results of liver function studies: Secondary | ICD-10-CM | POA: Diagnosis not present

## 2020-09-27 DIAGNOSIS — D090 Carcinoma in situ of bladder: Secondary | ICD-10-CM | POA: Diagnosis not present

## 2020-09-27 DIAGNOSIS — E1169 Type 2 diabetes mellitus with other specified complication: Secondary | ICD-10-CM | POA: Diagnosis not present

## 2020-10-03 ENCOUNTER — Ambulatory Visit (INDEPENDENT_AMBULATORY_CARE_PROVIDER_SITE_OTHER): Payer: Medicare Other

## 2020-10-03 ENCOUNTER — Other Ambulatory Visit: Payer: Self-pay

## 2020-10-03 DIAGNOSIS — Z8551 Personal history of malignant neoplasm of bladder: Secondary | ICD-10-CM

## 2020-10-03 LAB — URINALYSIS, ROUTINE W REFLEX MICROSCOPIC
Bilirubin, UA: NEGATIVE
Glucose, UA: NEGATIVE
Nitrite, UA: NEGATIVE
Specific Gravity, UA: 1.025 (ref 1.005–1.030)
Urobilinogen, Ur: 1 mg/dL (ref 0.2–1.0)
pH, UA: 6 (ref 5.0–7.5)

## 2020-10-03 LAB — MICROSCOPIC EXAMINATION: Renal Epithel, UA: NONE SEEN /hpf

## 2020-10-03 MED ORDER — BCG LIVE 50 MG IS SUSR
3.2400 mL | Freq: Once | INTRAVESICAL | Status: AC
  Administered 2020-10-03: 81 mg via INTRAVESICAL

## 2020-10-03 NOTE — Progress Notes (Signed)
BCG Bladder Instillation  BCG # 5  Due to Bladder Cancer patient is present today for a BCG treatment. Patient was cleaned and prepped in a sterile fashion with betadine. A 14FR catheter was inserted, urine return was noted 3ml, urine was yellow in color.  74ml of reconstituted BCG was instilled into the bladder. The catheter was then removed. Patient tolerated well, no complications were noted  Preformed by: Estill Bamberg RN  Follow up/ Additional notes: f/u #6 BCG

## 2020-10-08 IMAGING — CT CT CHEST W/O CM
2 of 4 series · 15 of 36 positions shown, 18 images · non-contrast
Comparison: CT chest dated 12/16/2013. Partial comparison to CT
abdomen/pelvis dated 01/16/2020.

CLINICAL DATA: Follow-up pulmonary nodule.

EXAM:
CT CHEST WITHOUT CONTRAST
TECHNIQUE: Multidetector CT imaging of the chest was performed following the
standard protocol without IV contrast.

[Series 2: routine chest without · axial · non-contrast · 0.77mm/px · z∈[+946,+1200]mm · 12 of 151 slices shown, 15 images]
[im 12/151  mediastinal]
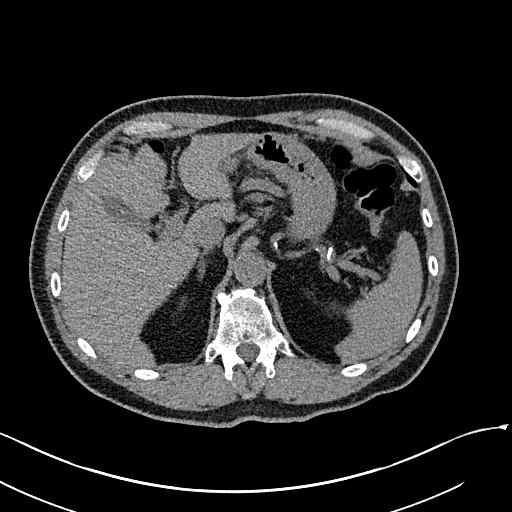
[im 12/151  lung]
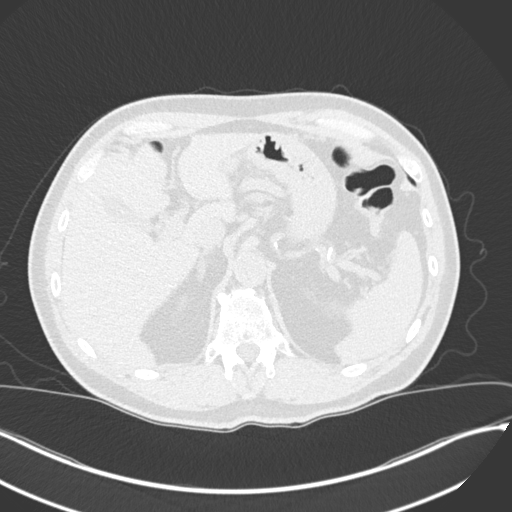
[im 24/151  lung]
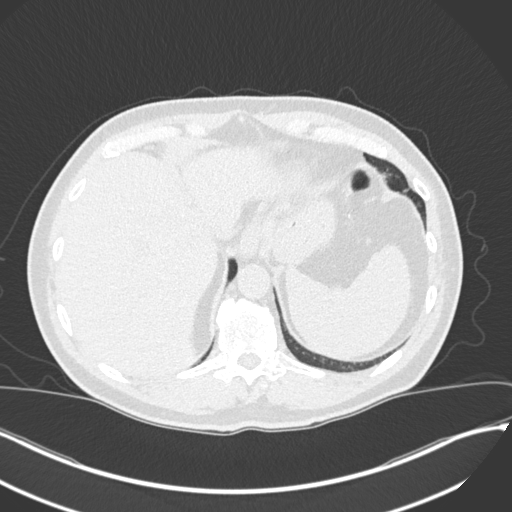
[im 35/151  lung]
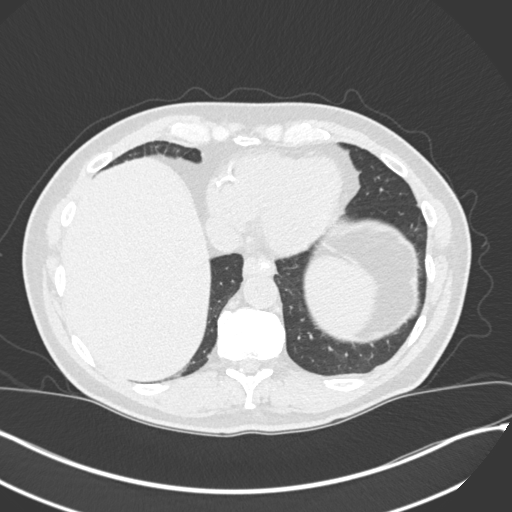
[im 47/151  lung]
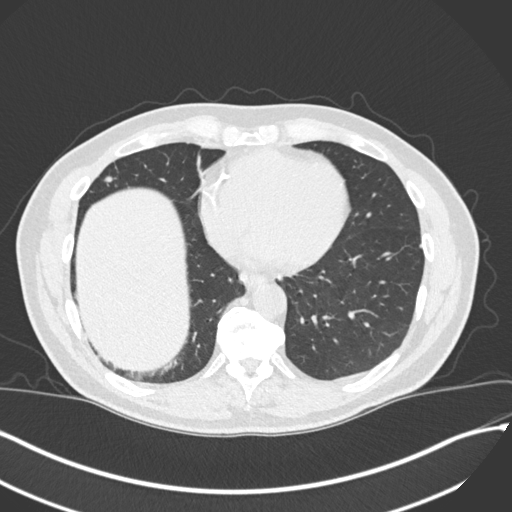
[im 58/151  mediastinal]
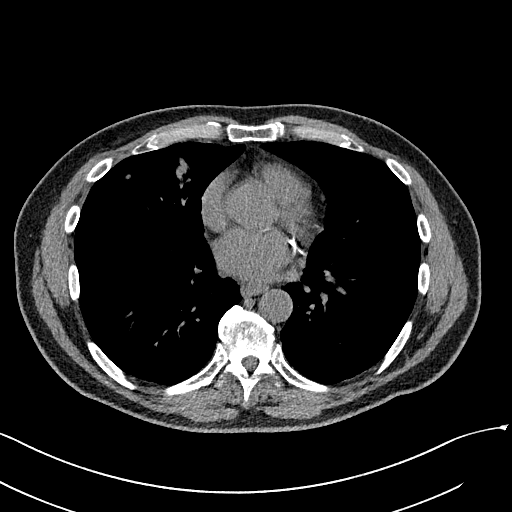
[im 58/151  lung]
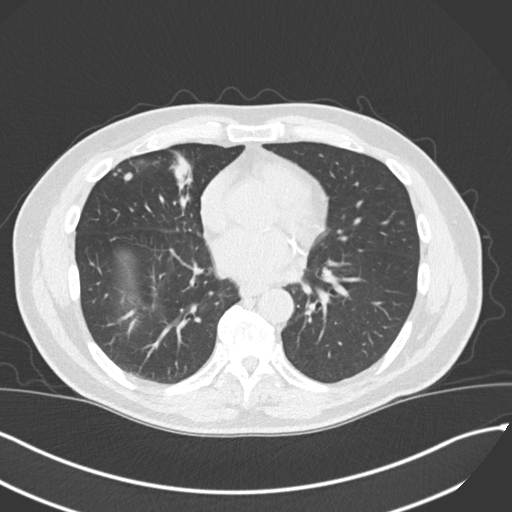
[im 70/151  lung]
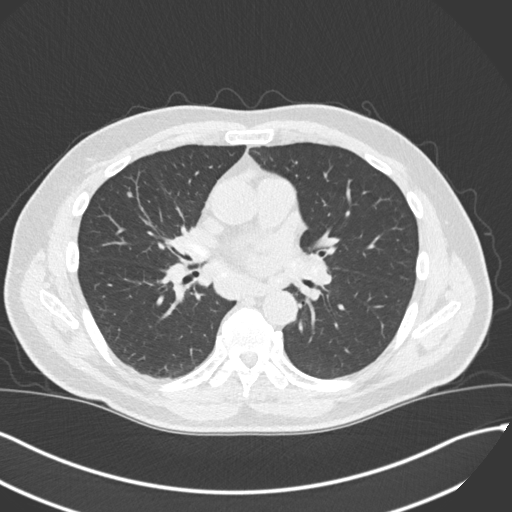
[im 81/151  lung]
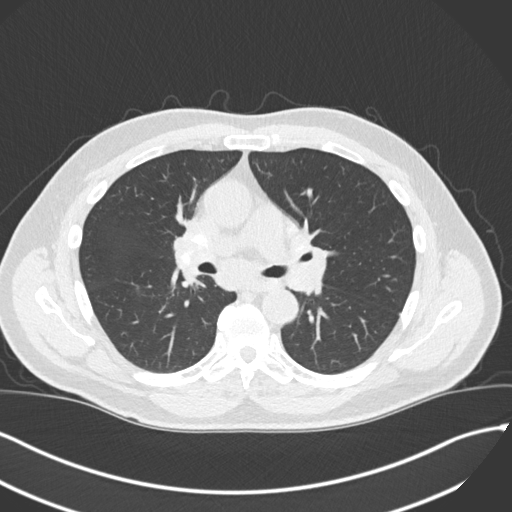
[im 93/151  lung]
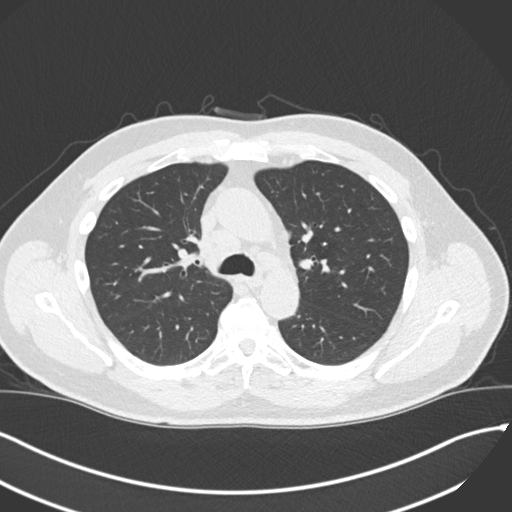
[im 104/151  mediastinal]
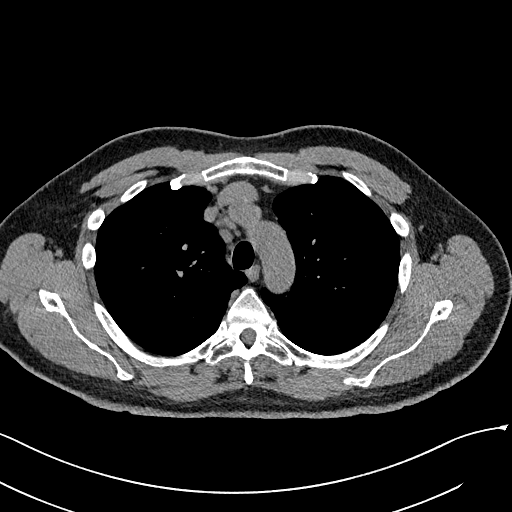
[im 104/151  lung]
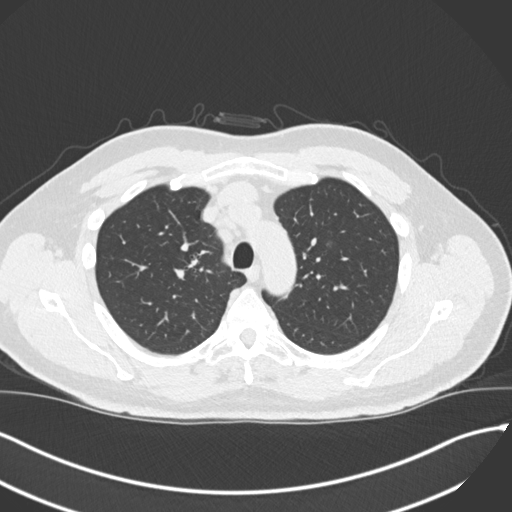
[im 116/151  lung]
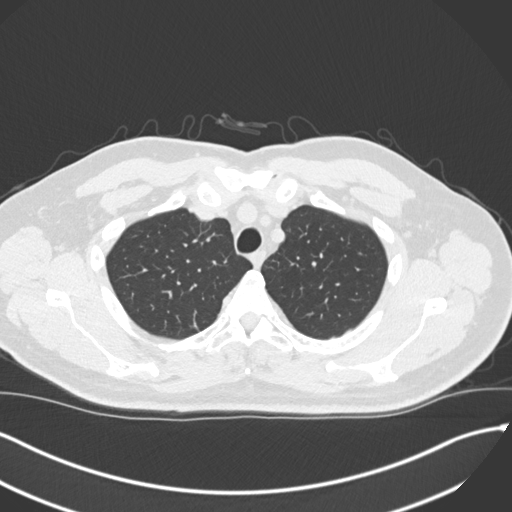
[im 127/151  lung]
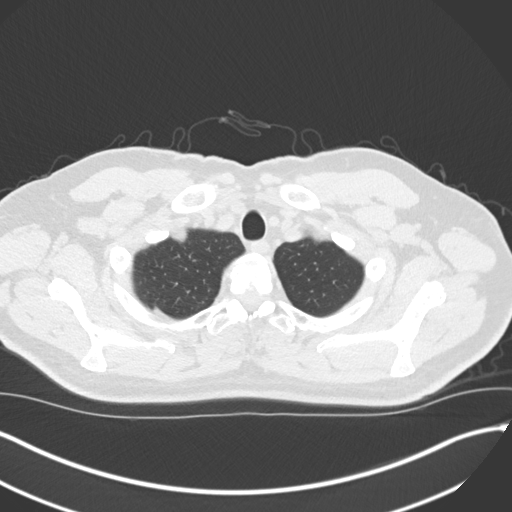
[im 139/151  lung]
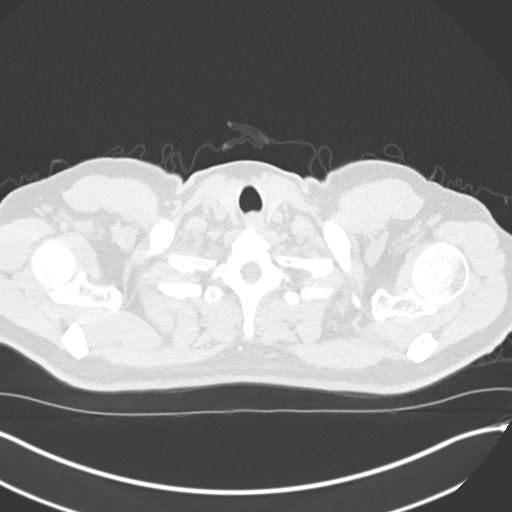

[Series 5: coronal · coronal · 0.61mm/px · 3 of 143 slices shown]
[im 29/143  lung]
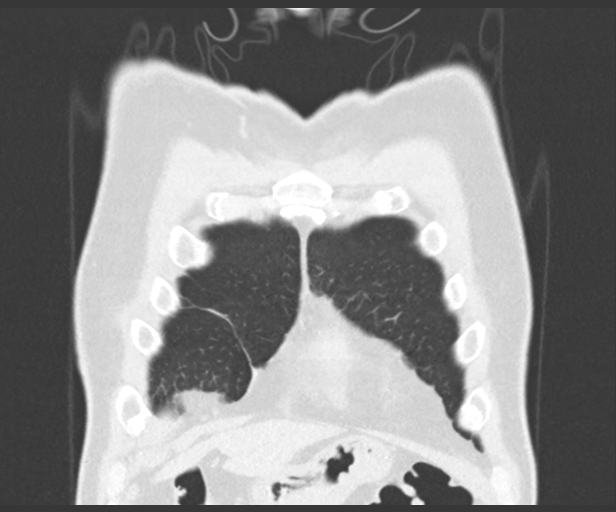
[im 57/143  lung]
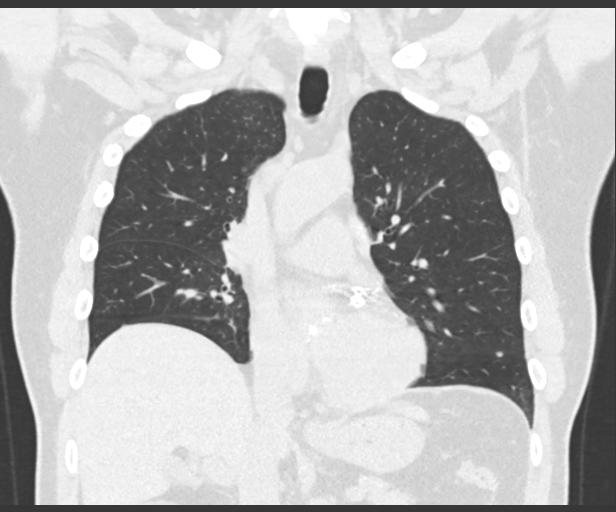
[im 86/143  lung]
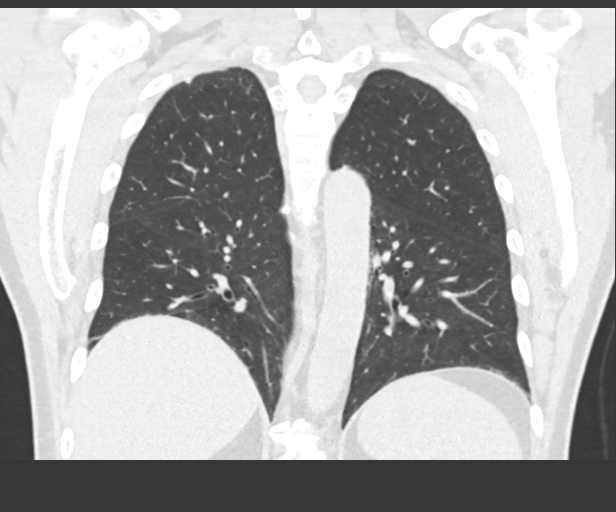

[15 of 36 positions shown; findings below may reference images not displayed]

FINDINGS: Cardiovascular: The heart is normal in size. No pericardial
effusion.

No evidence of thoracic aortic aneurysm.

Three vessel coronary atherosclerosis.

Mediastinum/Nodes: Calcified mediastinal and hilar lymphadenopathy,
chronic. Dominant subcarinal node measures 2.1 cm, unchanged.

No suspicious axillary lymphadenopathy.

Lungs/Pleura: 7 mm left upper lobe nodule (series 4/image 51),
previously 6 mm.

Scarring/atelectasis in the right middle lobe with progressive
peribronchovascular nodularity measuring up to 6 mm (series 4/image
90), much of which is new.

7 mm nodule inferiorly in the right middle lobe (series 4/image
100), previously 4 mm.

Additional scattered small bilateral pulmonary nodules, including
subpleural nodules in the bilateral lower lobes measuring up to 5
mm, many of which are new/progressive.

No emphysematous changes.

No focal consolidation.

No pleural effusion or pneumothorax.

Upper Abdomen: Visualized upper abdomen is grossly unremarkable,
noting mild vascular calcifications.

Musculoskeletal: Degenerative changes of the visualized
thoracolumbar spine.
IMPRESSION: Progressive bilateral pulmonary nodules measuring up to 7 mm, some
of which are minimally progressive from 7926 but many of which are
new. Dominant peribronchovascular distribution in the right middle
lobe with associated scarring/atelectasis.

Calcified mediastinal and hilar lymphadenopathy, chronic/unchanged
from 7926.

Overall, these findings are compatible with sarcoidosis.

None of these nodules are considered overly suspicious for primary
bronchogenic neoplasm in that context. If clinically warranted, a
single follow-up CT chest can be considered in 6-12 months.

## 2020-10-09 ENCOUNTER — Ambulatory Visit: Payer: Medicare Other | Admitting: Internal Medicine

## 2020-10-10 ENCOUNTER — Ambulatory Visit: Payer: Medicare Other

## 2020-10-11 ENCOUNTER — Other Ambulatory Visit: Payer: Medicare Other | Admitting: Urology

## 2020-10-12 ENCOUNTER — Other Ambulatory Visit: Payer: Self-pay

## 2020-10-12 ENCOUNTER — Ambulatory Visit (INDEPENDENT_AMBULATORY_CARE_PROVIDER_SITE_OTHER): Payer: Medicare Other

## 2020-10-12 DIAGNOSIS — D090 Carcinoma in situ of bladder: Secondary | ICD-10-CM

## 2020-10-12 LAB — MICROSCOPIC EXAMINATION: Renal Epithel, UA: NONE SEEN /hpf

## 2020-10-12 LAB — URINALYSIS, ROUTINE W REFLEX MICROSCOPIC
Bilirubin, UA: NEGATIVE
Glucose, UA: NEGATIVE
Nitrite, UA: NEGATIVE
Specific Gravity, UA: 1.02 (ref 1.005–1.030)
Urobilinogen, Ur: 1 mg/dL (ref 0.2–1.0)
pH, UA: 6 (ref 5.0–7.5)

## 2020-10-12 MED ORDER — BCG LIVE 50 MG IS SUSR
3.2400 mL | Freq: Once | INTRAVESICAL | Status: AC
  Administered 2020-10-12: 81 mg via INTRAVESICAL

## 2020-10-12 NOTE — Progress Notes (Signed)
BCG Bladder Instillation  BCG # 6  Due to Bladder Cancer patient is present today for a BCG treatment. Patient was cleaned and prepped in a sterile fashion with betadine. A 14FR catheter was inserted, urine return was noted 72ml, urine was yellow in color.  20ml of reconstituted BCG was instilled into the bladder. The catheter was then removed. Patient tolerated well, no complications were noted  Preformed by: Estill Bamberg RN  Follow up/ Additional notes: f/u with schedule MD appt

## 2020-10-12 NOTE — Patient Instructions (Signed)

## 2020-10-15 ENCOUNTER — Ambulatory Visit: Payer: Medicare Other | Admitting: Internal Medicine

## 2020-10-18 ENCOUNTER — Ambulatory Visit: Payer: Medicare Other

## 2020-10-24 ENCOUNTER — Ambulatory Visit: Payer: Medicare Other

## 2020-10-25 ENCOUNTER — Other Ambulatory Visit: Payer: Self-pay

## 2020-10-25 ENCOUNTER — Ambulatory Visit (HOSPITAL_COMMUNITY)
Admission: RE | Admit: 2020-10-25 | Discharge: 2020-10-25 | Disposition: A | Discharge status: Home / Self Care | Payer: Medicare Other | Source: Ambulatory Visit | Attending: Internal Medicine | Admitting: Internal Medicine

## 2020-10-25 ENCOUNTER — Other Ambulatory Visit (HOSPITAL_COMMUNITY)
Admission: RE | Admit: 2020-10-25 | Discharge: 2020-10-25 | Disposition: A | Discharge status: Home / Self Care | Payer: Medicare Other | Source: Ambulatory Visit | Attending: Internal Medicine | Admitting: Internal Medicine

## 2020-10-25 ENCOUNTER — Encounter: Payer: Self-pay | Admitting: Internal Medicine

## 2020-10-25 ENCOUNTER — Ambulatory Visit: Payer: Medicare Other | Admitting: Internal Medicine

## 2020-10-25 DIAGNOSIS — R918 Other nonspecific abnormal finding of lung field: Secondary | ICD-10-CM

## 2020-10-25 DIAGNOSIS — R911 Solitary pulmonary nodule: Secondary | ICD-10-CM | POA: Diagnosis not present

## 2020-10-25 LAB — HEPATIC FUNCTION PANEL
ALT: 26 U/L (ref 0–44)
AST: 24 U/L (ref 15–41)
Albumin: 4.3 g/dL (ref 3.5–5.0)
Alkaline Phosphatase: 71 U/L (ref 38–126)
Bilirubin, Direct: 0.2 mg/dL (ref 0.0–0.2)
Indirect Bilirubin: 1.2 mg/dL — ABNORMAL HIGH (ref 0.3–0.9)
Total Bilirubin: 1.4 mg/dL — ABNORMAL HIGH (ref 0.3–1.2)
Total Protein: 7.5 g/dL (ref 6.5–8.1)

## 2020-10-25 LAB — BASIC METABOLIC PANEL
Anion gap: 8 (ref 5–15)
BUN: 22 mg/dL (ref 8–23)
CO2: 28 mmol/L (ref 22–32)
Calcium: 9.9 mg/dL (ref 8.9–10.3)
Chloride: 103 mmol/L (ref 98–111)
Creatinine, Ser: 1.25 mg/dL — ABNORMAL HIGH (ref 0.61–1.24)
GFR, Estimated: >60 mL/min (ref >60)
Glucose, Bld: 108 mg/dL — ABNORMAL HIGH (ref 70–99)
Potassium: 4.4 mmol/L (ref 3.5–5.1)
Sodium: 139 mmol/L (ref 135–145)

## 2020-10-25 LAB — SEDIMENTATION RATE: Sed Rate: 25 mm/hr — ABNORMAL HIGH (ref 0–16)

## 2020-10-25 NOTE — Patient Instructions (Signed)
No change in recommendations but try to build up to 30 min of exercise  Please remember to go to the lab and x-ray department at [facility]   for your tests - we will call you with the results when they are available.      Please schedule a follow up visit in 6  months but call sooner if needed

## 2020-10-25 NOTE — Assessment & Plan Note (Addendum)
First noted 2014 with nl baseline cxr -  See CT chest 02/16/20 c/w low grade sarcoidosis  -  ACE level 10/25/2020 pending   Since some of the nodules are clearly viz on plain cxr and are unchanged with ESR only 25 strongly doubt active sarcoid and certainly met ca unlikely here, esp from bladder   Discussed in detail all the  indications, usual  risks and alternatives  relative to the benefits with patient who agrees to proceed with conservative f/u with cxr/ ov in 6 m unless new resp symptoms in interim          Each maintenance medication was reviewed in detail including emphasizing most importantly the difference between maintenance and prns and under what circumstances the prns are to be triggered using an action plan format where appropriate.  Total time for H and P, chart review, counseling,  and generating customized AVS unique to this office visit / same day charting  > 30 min

## 2020-10-25 NOTE — Progress Notes (Signed)
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 Consult    shortness of breath with exertion  Dyspnea:  MMRC1 = can walk nl pace, flat grade, can't hurry or go uphills or steps s sob   Cough: nothing unusual x year /dry  Sleep: ok flat bed/ one pillow SABA use: none  Finishing weekly BCG next week for localized transitional cell ca and tol fine rec No change rx   10/25/2020  f/u ov/[place] office/Ka[place]en Desena re: MPNS   Chief Complaint  Patient presents with  . Follow-up    Breathing has improved since the last visit. No new co's.    Dyspnea:  Ex x treadmill x 1.5 mph on grade ? % x 15 min daily s sob  Cough: none Sleeping: no resp flat / on side  SABA use: no 02: no Covid status: vax x 3  Lung cancer screening: na     No obvious day to day or daytime variability or assoc excess/ purulent sputum or mucus plugs or hemoptysis or cp or chest tightness, subjective wheeze or overt sinus or hb symptoms.   Sleeping fine  without nocturnal  or early am exacerbation  of respiratory  c/o's or need for noct saba. Also denies any obvious fluctuation of symptoms with weather or environmental changes or other aggravating or alleviating factors except as outlined above   No unusual exposure hx or h/o childhood pna/ asthma or knowledge of premature birth.  Current Allergies, Complete Past Medical History, Past Surgical History, Family History, and Social History were reviewed in [employer] record.  ROS  The following are  not active complaints unless bolded Hoarseness, sore throat, dysphagia, dental problems, itching, sneezing,  nasal congestion or discharge of excess mucus or purulent secretions, ear ache,   fever, chills, sweats, unintended wt loss or wt gain, classically pleuritic or exertional cp,  orthopnea pnd or arm/hand swelling  or leg swelling, presyncope, palpitations, abdominal pain, anorexia, nausea, vomiting, diarrhea  or change in bowel habits or change in bladder habits, change in stools or change in urine, dysuria, hematuria,  rash, arthralgias, visual complaints, headache, numbness, weakness or ataxia or problems with walking or coordination,  change in mood or  memory.        Current Meds  Medication Sig  . b complex vitamins tablet Take 1 tablet by mouth daily.  . Cholecalciferol (VITAMIN D3) 2000 UNITS TABS Take 2,000 Units by mouth daily.  . Coenzyme Q10 200 MG capsule Take 200 mg by mouth daily.  [name] gabapentin (NEURONTIN) 100 MG capsule Take 100 mg by mouth at bedtime.   [name] levothyroxine (SYNTHROID) 88 MCG tablet Take 88 mcg by mouth daily before breakfast.  . loratadine (CLARITIN) 10 MG tablet Take 10 mg by mouth daily.  [name] losartan (COZAAR) 50 MG tablet Take 50 mg by mouth daily.  . meloxicam (MOBIC) 15 MG tablet Take 15 mg by mouth daily as  needed for pain.   . pantoprazole (PROTONIX) 40 MG tablet Take 40 mg by mouth daily.  . traMADol (ULTRAM) 50 MG tablet Take 50 mg by mouth every 6 (six) hours as needed for moderate pain.   [name] ZOCOR 40 MG tablet TAKE ONE TABLET BY MOUTH IN THE EVENING. (Patient taking differently: Take 40 mg by mouth every evening.)           Past Medical History:  Diagnosis Date  . Arthritis   . BPH (benign prostatic hyperplasia) 08/1989  . GERD (gastroesophageal reflux disease) 11/29/1998  . Hyperlipidemia 08/1997  . Hypertension   . Hypothyroidism 1983  . Lesion of bladder   . Spermatocele    per Dr. Jeffie Pollock with URO       Objective:    Wt Readings from  Last 3 Encounters:  10/25/20 189 lb 6.4 oz (85.9 kg)  09/06/20 188 lb (85.3 kg)  08/09/20 187 lb (84.8 kg)      Vital signs reviewed  10/25/2020  - Note at rest 02 sats  97% on RA   General appearance:    Robust pleasant amb wm nad   HEENT : pt wearing mask not removed for exam due to covid -19 concerns.    NECK :  without JVD/Nodes/TM/ nl carotid upstrokes bilaterally   LUNGS: no acc muscle use,  Nl contour chest which is clear to A and P bilaterally without cough on insp or exp maneuvers   CV:  RRR  no s3 or murmur or increase in P2, and no edema   ABD:  soft and nontender with nl inspiratory excursion in the supine position. No bruits or organomegaly appreciated, bowel sounds nl  MS:  Nl gait/ ext warm without deformities, calf tenderness, cyanosis or clubbing No obvious joint restrictions   SKIN: warm and dry without lesions    NEURO:  alert, approp, nl sensorium with  no motor or cerebellar deficits apparent.    CXR PA and Lateral:   10/25/2020 :    I personally reviewed images and  impression as follows:   No acute change   Labs ordered/ reviewed:      Chemistry      Component Value Date/Time   NA 139 10/25/2020 1257   K 4.4 10/25/2020 1257   CL 103 10/25/2020 1257   CO2 28 10/25/2020 1257   BUN 22 10/25/2020 1257   CREATININE 1.25 (H) 10/25/2020 1257      Component Value Date/Time   CALCIUM 9.9 10/25/2020 1257   ALKPHOS 71 10/25/2020 1257   AST 24 10/25/2020 1257   ALT 26 10/25/2020 1257   BILITOT 1.4 (H) 10/25/2020 1257        Lab Results  Component Value Date   WBC 5.0 08/06/2020   HGB 16.6 08/06/2020   HCT 48.1 08/06/2020   MCV 86.4 08/06/2020   PLT 173 08/06/2020           Lab Results  Component Value Date   ESRSEDRATE 25 (H) 10/25/2020        Labs ordered 10/25/2020  :  ACE level               Assessment

## 2020-10-26 ENCOUNTER — Encounter: Payer: Self-pay | Admitting: *Deleted

## 2020-10-26 LAB — ANGIOTENSIN CONVERTING ENZYME: Angiotensin-Converting Enzyme: 66 U/L (ref 14–82)

## 2020-10-29 ENCOUNTER — Encounter: Payer: Self-pay | Admitting: *Deleted

## 2020-10-30 ENCOUNTER — Other Ambulatory Visit: Payer: Medicare Other

## 2020-10-30 ENCOUNTER — Other Ambulatory Visit: Payer: Self-pay

## 2020-10-30 DIAGNOSIS — R300 Dysuria: Secondary | ICD-10-CM

## 2020-10-30 LAB — URINALYSIS, ROUTINE W REFLEX MICROSCOPIC
Bilirubin, UA: NEGATIVE
Glucose, UA: NEGATIVE
Nitrite, UA: NEGATIVE
Protein,UA: NEGATIVE
Specific Gravity, UA: 1.015 (ref 1.005–1.030)
Urobilinogen, Ur: 0.2 mg/dL (ref 0.2–1.0)
pH, UA: 6.5 (ref 5.0–7.5)

## 2020-10-30 LAB — MICROSCOPIC EXAMINATION
Epithelial Cells (non renal): NONE SEEN /hpf (ref 0–10)
Renal Epithel, UA: NONE SEEN /hpf
WBC, UA: >30 /hpf — AB (ref 0–5)

## 2020-11-01 LAB — CULTURE, URINE COMPREHENSIVE

## 2020-11-07 NOTE — Progress Notes (Signed)
Pt notified through My chart

## 2020-11-14 NOTE — Progress Notes (Signed)
Subjective:  1. History of bladder cancer   2. Burning with urination   3. Urinary frequency   4. CIS (carcinoma in situ of bladder)     11/15/20: Ahmari returns today f/u for his history of CIS.   He had his last BCG about a month ago and still has dysuria, frequency and urgency with nocturia x 5.  He isn't sure he empties.  He has a good stream.  His UA today has 3-10 RBC's.   09/06/20: Joanthony returns today in f/u from his recent cystoscopy with biopsy and fulguration on 08/09/20.  He was found to have recurrent/persistent CIS in 4/4 biopsy sites on the dome, base and posterior wall of the bladder.  He is having no hematuria or dysuria.  His IPSS is 6.    07/20/20: Sheila returns today in f/u for his history of CIS found on TURBT from a left lateral wall lesion on 02/07/20 that was followed by BCG induction treatment that he completed on 04/16/20 in the [place] office. Marland KitchenHe is voiding with reduced LUTS with an IPSS of 9.  He has had no hematuria.   01/16/20: Joshuah returns today in f/u. His cytology is positive. A CT today showed bilateral renal cysts and prostate enlargement but no bladder lesions or upper tract lesions. His IPSS remains elevated at 24.   01/04/20: Treyshon returns today in f/u for his LUTS with OAB. He was given a trial of Vesicare on 12/01/19 but didin't tolerate it or notice improvement . His IPSS is 25 with frequency and a sensation of incomplete emptying as primary issues. he has nocturia x 4 and urgency. He has some chronic dysuria and Azo doesn't help. He had no gross hematuria but he has 3-10 RBC's today.   12/01/2019: Seen in the past for ED and balanitis by Dr Jeffie Pollock. He is on Androgel for hypogonadism managed by his primary care physician. Presents today complaining of worsening frequency/urgency and nocturia that has persistently increased since the fall of last year. His PCP has previously trialed him on tamsulosin, Toviaz, and Myrbetriq with patient reporting no notable difference is in  voiding symptoms with either of the medications. Not associated with any hesitancy or straining. He tells me tries to drink a lot of water but that seems to only make symptoms worse. Also associated with chronic intermittent burning with urination. He does not have any associated lower back or flank pain/discomfort. He denies any visible blood in the urine. Not associated with fevers or chills, nausea/vomiting.     IPSS    Row Name 11/15/20 1100         International Prostate Symptom Score   How often have you had the sensation of not emptying your bladder? Almost always     How often have you had to urinate less than every two hours? Almost always     How often have you found you stopped and started again several times when you urinated? More than half the time     How often have you found it difficult to postpone urination? More than half the time     How often have you had a weak urinary stream? Less than 1 in 5 times     How often have you had to strain to start urination? Not at All     How many times did you typically get up at night to urinate? 5 Times     Total IPSS Score 24  Quality of Life due to urinary symptoms   If you were to spend the rest of your life with your urinary condition just the way it is now how would you feel about that? Unhappy             ROS:  ROS:  A complete review of systems was performed.  All systems are negative except for pertinent findings as noted.   ROS  Allergies  Allergen Reactions  . Cephalexin Rash  . Penicillins Rash  . Sulfonamide Derivatives Rash    Outpatient Encounter Medications as of 11/15/2020  Medication Sig  . b complex vitamins tablet Take 1 tablet by mouth daily.  . Cholecalciferol (VITAMIN D3) 2000 UNITS TABS Take 2,000 Units by mouth daily.  . Coenzyme Q10 200 MG capsule Take 200 mg by mouth daily.  [name] gabapentin (NEURONTIN) 100 MG capsule Take 100 mg by mouth at bedtime.   [name] levofloxacin (LEVAQUIN) 500 MG  tablet Take 1 tablet (500 mg total) by mouth daily.  [name] levothyroxine (SYNTHROID) 88 MCG tablet Take 88 mcg by mouth daily before breakfast.  . loratadine (CLARITIN) 10 MG tablet Take 10 mg by mouth daily.  [name] losartan (COZAAR) 50 MG tablet Take 50 mg by mouth daily.  . meloxicam (MOBIC) 15 MG tablet Take 15 mg by mouth daily as needed for pain.   . pantoprazole (PROTONIX) 40 MG tablet Take 40 mg by mouth daily.  . traMADol (ULTRAM) 50 MG tablet Take 50 mg by mouth every 6 (six) hours as needed for moderate pain.   [name] ZOCOR 40 MG tablet TAKE ONE TABLET BY MOUTH IN THE EVENING. (Patient taking differently: Take 40 mg by mouth every evening.)  . [EXPIRED] ciprofloxacin (CIPRO) tablet 500 mg   . [DISCONTINUED] ciprofloxacin (CIPRO) tablet 500 mg    No facility-administered encounter medications on file as of 11/15/2020.    Past Medical History:  Diagnosis Date  . Anemia   . Arthritis   . BPH (benign prostatic hyperplasia) 08/1989  . Dyspnea    with exertion  . GERD (gastroesophageal reflux disease) 11/29/1998  . Hyperlipidemia 08/1997  . Hypertension   . Hypothyroidism 1983  . Lesion of bladder   . Pulmonary nodules   . Spermatocele    per Dr. Jeffie Pollock with URO    Past Surgical History:  Procedure Laterality Date  . CATARACT EXTRACTION  03/2003   left eye  . COLONOSCOPY  08/10/09   polyp in ascending colon otherwise normal  . COLONOSCOPY WITH ESOPHAGOGASTRODUODENOSCOPY (EGD) N/A 10/28/2013   Dr. Rourk:Schatzki's ring - not manipulated because no dysphagia currently. Small hiatal hernia.  Gastric and duodenal erosions of uncertain significance-status post biopsy. path with benign findings, no villous atrophy, no H.pylori/TCS:Colonic diverticulosis  . CYSTOSCOPY  08/1989   normal  . CYSTOSCOPY WITH BIOPSY N/A 02/07/2020   Procedure: CYSTOSCOPY WITH BLADDER AND PROSTATIS URETHRAL  BIOPSY WIHT FULGURATION AND INSTILL GEMCITABINE;  Surgeon: Irine Seal, MD;  Location: [facility];  Service: Urology;  Laterality: N/A;  . CYSTOSCOPY WITH BIOPSY Bilateral 08/09/2020   Procedure: CYSTOSCOPY WITH BLADDER BIOPSYAND FULGURATION BILATERAL RETROGRADE S;  Surgeon: Irine Seal, MD;  Location: [facility];  Service: Urology;  Laterality: Bilateral;  . ESOPHAGOGASTRODUODENOSCOPY  10/1999   distal esoph. stricture; sliding H. H.  . LASIK  04/12/2003   right eye  . lumbar microdisectomy  08/2000   Dr. Hal Neer    Social History   Socioeconomic History  . Marital status: Married  Spouse name: Not on file  . Number of children: 1  . Years of education: Not on file  . Highest education level: Not on file  Occupational History  . Occupation: Music therapist: [employer]    Comment: Music therapist  Tobacco Use  . Smoking status: Never Smoker  . Smokeless tobacco: Never Used  Vaping Use  . Vaping Use: Never used  Substance and Sexual Activity  . Alcohol use: No  . Drug use: No  . Sexual activity: Yes  Other Topics Concern  . Not on file  Social History Narrative   Married and lives with wife   Social Determinants of Health   Financial Resource Strain: Not on file  Food Insecurity: Not on file  Transportation Needs: Not on file  Physical Activity: Not on file  Stress: Not on file  Social Connections: Not on file  Intimate Partner Violence: Not on file    Family History  Problem Relation Age of Onset  . Hypertension Mother   . Hyperlipidemia Mother   . Diabetes Mother 53  . Heart disease Father        CABG X 4  12/2001  . Hyperlipidemia Father   . Hypertension Father   . Heart disease Brother        CABG X 1  . Heart disease Paternal Uncle        MI  . Cancer Maternal Grandfather        colon  . Diabetes Maternal Uncle   . Arthritis Maternal Uncle   . Stroke Paternal Grandfather   . Allergic rhinitis Neg Hx   . Angioedema Neg Hx   . Asthma Neg Hx   . Atopy Neg Hx   . Eczema Neg Hx   . Immunodeficiency Neg Hx   . Urticaria Neg  Hx        Objective: Vitals:   11/15/20 1055  BP: (!) 161/94  Pulse: 76  Temp: 98.2 F (36.8 C)     Physical Exam  Lab Results:  PSA PSA  Date Value Ref Range Status  08/05/2011 0.98 0.10 - 4.00 ng/mL Final  07/22/2010 0.85 0.10 - 4.00 ng/mL Final  03/26/2009 1.03 0.10 - 4.00 ng/mL Final   No results found for: TESTOSTERONE    Studies/Results: Results for orders placed or performed in visit on 11/15/20 (from the past 24 hour(s))  Urinalysis, Routine w reflex microscopic     Status: Abnormal   Collection Time: 11/15/20 10:41 AM  Result Value Ref Range   Specific Gravity, UA 1.020 1.005 - 1.030   pH, UA 6.5 5.0 - 7.5   Color, UA Yellow Yellow   Appearance Ur Clear Clear   Leukocytes,UA Trace (A) Negative   Protein,UA Negative Negative/Trace   Glucose, UA Negative Negative   Ketones, UA Negative Negative   RBC, UA 1+ (A) Negative   Bilirubin, UA Negative Negative   Urobilinogen, Ur 0.2 0.2 - 1.0 mg/dL   Nitrite, UA Negative Negative   Microscopic Examination See below:    Narrative   Performed at:  [facility] [address] [place], [place]  [id number] Lab Director: Mina Marble MT, Phone:  [phone]  Microscopic Examination     Status: Abnormal   Collection Time: 11/15/20 10:41 AM   Urine  Result Value Ref Range   WBC, UA 0-5 0 - 5 /hpf   RBC 3-10 (A) 0 - 2 /hpf   Epithelial Cells (non renal) None seen 0 -  10 /hpf   Renal Epithel, UA None seen None seen /hpf   Mucus, UA Present Not Estab.   Bacteria, UA Few (A) None seen/Few   Narrative   Performed at:  [facility] [address] [place], [place]  [id number] Lab Director: Mina Marble MT, Phone:  [phone]    Path: CIS at multiple sites prior to second BCG.  Procedure: Flexible cystoscopy.  He was prepped with betadine and 2% lidocaine jelly and given Cipro 500mg  po.  The flexible scope was passed.  The urethra was normal.  The prostate was short with mild  hyperplasia.  The bladder wall had some trabeculation adjacent to the resection scar on the dome.  There was surrounding erythema of the dome and anterior left lateral wall that was most consistent with inflammatory change but one area on the left was more concerning.  No papillary lesions were seen and the UO's were normal.   He was filled with saline and voided to collect a post cystoscopy specimen for cytology.      Assessment & Plan: Recurrent/Persistent CIS s/p BCG induction.  He has completed a second induction course and on cystoscopy he still  Has significant erythema that appears inflammatory but there is one area on the left that is a little more worrisome.  I will get a cytology today and start him on levaquin 500mg  po qday x 14 days.  If the cytology is abnormal, we will need rebiopsy him but if it is ok, he will complete the levaquin and return in 4-6 weeks for reevaluation with plan for repeat cystoscopy in 3 months.   OAB.  This has worsened with the second round of  BCG treatment.  I will reassess after he takes the levaquin.      Meds ordered this encounter  Medications  . DISCONTD: ciprofloxacin (CIPRO) tablet 500 mg  . levofloxacin (LEVAQUIN) 500 MG tablet    Sig: Take 1 tablet (500 mg total) by mouth daily.    Dispense:  14 tablet    Refill:  0  . ciprofloxacin (CIPRO) tablet 500 mg     Orders Placed This Encounter  Procedures  . Microscopic Examination  . Urinalysis, Routine w reflex microscopic      Return in about 4 weeks (around 12/13/2020) for 4-6 week f/u.  [name]   CC: Celene Squibb, MD      Irine Seal 11/15/2020

## 2020-11-15 ENCOUNTER — Ambulatory Visit (INDEPENDENT_AMBULATORY_CARE_PROVIDER_SITE_OTHER): Payer: Medicare Other | Admitting: Urology

## 2020-11-15 ENCOUNTER — Encounter: Payer: Self-pay | Admitting: Urology

## 2020-11-15 ENCOUNTER — Other Ambulatory Visit: Payer: Self-pay

## 2020-11-15 VITALS — BP 161/94 | HR 76 | Temp 98.2°F | Ht 70.0 in | Wt 187.0 lb

## 2020-11-15 DIAGNOSIS — D090 Carcinoma in situ of bladder: Secondary | ICD-10-CM | POA: Diagnosis not present

## 2020-11-15 DIAGNOSIS — R300 Dysuria: Secondary | ICD-10-CM | POA: Diagnosis not present

## 2020-11-15 DIAGNOSIS — Z8551 Personal history of malignant neoplasm of bladder: Secondary | ICD-10-CM | POA: Diagnosis not present

## 2020-11-15 DIAGNOSIS — R350 Frequency of micturition: Secondary | ICD-10-CM

## 2020-11-15 LAB — URINALYSIS, ROUTINE W REFLEX MICROSCOPIC
Bilirubin, UA: NEGATIVE
Glucose, UA: NEGATIVE
Ketones, UA: NEGATIVE
Nitrite, UA: NEGATIVE
Protein,UA: NEGATIVE
Specific Gravity, UA: 1.02 (ref 1.005–1.030)
Urobilinogen, Ur: 0.2 mg/dL (ref 0.2–1.0)
pH, UA: 6.5 (ref 5.0–7.5)

## 2020-11-15 LAB — MICROSCOPIC EXAMINATION
Epithelial Cells (non renal): NONE SEEN /hpf (ref 0–10)
Renal Epithel, UA: NONE SEEN /hpf

## 2020-11-15 MED ORDER — CIPROFLOXACIN HCL 500 MG PO TABS
500.0000 mg | ORAL_TABLET | Freq: Once | ORAL | Status: AC
  Administered 2020-11-15: 500 mg via ORAL

## 2020-11-15 MED ORDER — LEVOFLOXACIN 500 MG PO TABS: 500.0000 mg | ORAL_TABLET | Freq: Every day | ORAL | 0 refills | Status: DC

## 2020-11-15 MED ORDER — CIPROFLOXACIN HCL 500 MG PO TABS: 500.0000 mg | ORAL_TABLET | Freq: Once | ORAL | Status: DC

## 2020-11-15 NOTE — Progress Notes (Signed)
Urological Symptom Review  Patient is experiencing the following symptoms: Frequent urination Hard to postpone urination Burning/pain with urination Get up at night to urinate Leakage of urine Stream starts and stops   Review of Systems  Gastrointestinal (upper)  : Negative for upper GI symptoms  Gastrointestinal (lower) : Negative for lower GI symptoms  Constitutional : Negative for symptoms  Skin: Negative for skin symptoms  Eyes: Negative for eye symptoms  Ear/Nose/Throat : Negative for Ear/Nose/Throat symptoms  Hematologic/Lymphatic: Negative for Hematologic/Lymphatic symptoms  Cardiovascular : Negative for cardiovascular symptoms  Respiratory : Negative for respiratory symptoms  Endocrine: Negative for endocrine symptoms  Musculoskeletal: Negative for musculoskeletal symptoms  Neurological: Negative for neurological symptoms  Psychologic: Negative for psychiatric symptoms

## 2020-11-16 LAB — CYTOLOGY, URINE

## 2020-11-28 DIAGNOSIS — D090 Carcinoma in situ of bladder: Secondary | ICD-10-CM | POA: Diagnosis not present

## 2020-11-28 DIAGNOSIS — E1169 Type 2 diabetes mellitus with other specified complication: Secondary | ICD-10-CM | POA: Diagnosis not present

## 2020-11-28 DIAGNOSIS — I10 Essential (primary) hypertension: Secondary | ICD-10-CM | POA: Diagnosis not present

## 2020-11-28 DIAGNOSIS — K219 Gastro-esophageal reflux disease without esophagitis: Secondary | ICD-10-CM | POA: Diagnosis not present

## 2020-12-10 ENCOUNTER — Other Ambulatory Visit: Payer: Self-pay

## 2020-12-10 ENCOUNTER — Telehealth: Payer: Self-pay

## 2020-12-10 DIAGNOSIS — R300 Dysuria: Secondary | ICD-10-CM

## 2020-12-10 DIAGNOSIS — R350 Frequency of micturition: Secondary | ICD-10-CM

## 2020-12-10 MED ORDER — LEVOFLOXACIN 500 MG PO TABS: 500.0000 mg | ORAL_TABLET | Freq: Every day | ORAL | 0 refills | Status: DC

## 2020-12-10 NOTE — Telephone Encounter (Signed)
Rx sent patient called and made aware.

## 2020-12-10 NOTE — Telephone Encounter (Signed)
Please sent levaquin 500mg  po qday #7.

## 2020-12-19 DIAGNOSIS — E782 Mixed hyperlipidemia: Secondary | ICD-10-CM | POA: Diagnosis not present

## 2020-12-19 DIAGNOSIS — E1169 Type 2 diabetes mellitus with other specified complication: Secondary | ICD-10-CM | POA: Diagnosis not present

## 2020-12-19 DIAGNOSIS — I10 Essential (primary) hypertension: Secondary | ICD-10-CM | POA: Diagnosis not present

## 2020-12-19 DIAGNOSIS — R945 Abnormal results of liver function studies: Secondary | ICD-10-CM | POA: Diagnosis not present

## 2020-12-24 DIAGNOSIS — E039 Hypothyroidism, unspecified: Secondary | ICD-10-CM | POA: Diagnosis not present

## 2020-12-24 DIAGNOSIS — E782 Mixed hyperlipidemia: Secondary | ICD-10-CM | POA: Diagnosis not present

## 2020-12-24 DIAGNOSIS — N182 Chronic kidney disease, stage 2 (mild): Secondary | ICD-10-CM | POA: Diagnosis not present

## 2020-12-24 DIAGNOSIS — E1169 Type 2 diabetes mellitus with other specified complication: Secondary | ICD-10-CM | POA: Diagnosis not present

## 2021-01-03 ENCOUNTER — Other Ambulatory Visit: Payer: Self-pay

## 2021-01-03 ENCOUNTER — Ambulatory Visit: Payer: Medicare Other | Admitting: Urology

## 2021-01-03 VITALS — BP 130/83 | HR 76

## 2021-01-03 DIAGNOSIS — R350 Frequency of micturition: Secondary | ICD-10-CM

## 2021-01-03 DIAGNOSIS — R300 Dysuria: Secondary | ICD-10-CM | POA: Diagnosis not present

## 2021-01-03 DIAGNOSIS — D090 Carcinoma in situ of bladder: Secondary | ICD-10-CM | POA: Diagnosis not present

## 2021-01-03 LAB — MICROSCOPIC EXAMINATION

## 2021-01-03 LAB — URINALYSIS, ROUTINE W REFLEX MICROSCOPIC
Bilirubin, UA: NEGATIVE
Glucose, UA: NEGATIVE
Leukocytes,UA: NEGATIVE
Nitrite, UA: NEGATIVE
Specific Gravity, UA: 1.03 (ref 1.005–1.030)
Urobilinogen, Ur: 0.2 mg/dL (ref 0.2–1.0)
pH, UA: 6 (ref 5.0–7.5)

## 2021-01-03 MED ORDER — LEVOFLOXACIN 500 MG PO TABS: 500.0000 mg | ORAL_TABLET | Freq: Every day | ORAL | 0 refills | Status: DC

## 2021-01-03 NOTE — Progress Notes (Signed)
Subjective:  1. Urinary frequency   2. CIS (carcinoma in situ of bladder)   3. Burning with urination     01/03/21: Isaac Guerrero returns today in f/u.  He was given Levaquin 500mg  x 7 days in mid April and the burning is improving.  HIs UA today has 3-10 RBC's today.  He continues to have frequency and nocturia that are improving.  He was up 3 x last night.  His last BCG of the second induction course for his persistent CIS was in February.    11/15/20: Isaac Guerrero returns today f/u for his history of CIS.   He had his last BCG about a month ago and still has dysuria, frequency and urgency with nocturia x 5.  He isn't sure he empties.  He has a good stream.  His UA today has 3-10 RBC's.   09/06/20: Isaac Guerrero returns today in f/u from his recent cystoscopy with biopsy and fulguration on 08/09/20.  He was found to have recurrent/persistent CIS in 4/4 biopsy sites on the dome, base and posterior wall of the bladder.  He is having no hematuria or dysuria.  His IPSS is 6.    07/20/20: Isaac Guerrero returns today in f/u for his history of CIS found on TURBT from a left lateral wall lesion on 02/07/20 that was followed by BCG induction treatment that he completed on 04/16/20 in the [place] office. Marland KitchenHe is voiding with reduced LUTS with an IPSS of 9.  He has had no hematuria.   01/16/20: Isaac Guerrero returns today in f/u. His cytology is positive. A CT today showed bilateral renal cysts and prostate enlargement but no bladder lesions or upper tract lesions. His IPSS remains elevated at 24.   01/04/20: Isaac Guerrero returns today in f/u for his LUTS with OAB. He was given a trial of Vesicare on 12/01/19 but didin't tolerate it or notice improvement . His IPSS is 25 with frequency and a sensation of incomplete emptying as primary issues. he has nocturia x 4 and urgency. He has some chronic dysuria and Azo doesn't help. He had no gross hematuria but he has 3-10 RBC's today.   12/01/2019: Seen in the past for ED and balanitis by Dr Jeffie Pollock. He is on Androgel for  hypogonadism managed by his primary care physician. Presents today complaining of worsening frequency/urgency and nocturia that has persistently increased since the fall of last year. His PCP has previously trialed him on tamsulosin, Toviaz, and Myrbetriq with patient reporting no notable difference is in voiding symptoms with either of the medications. Not associated with any hesitancy or straining. He tells me tries to drink a lot of water but that seems to only make symptoms worse. Also associated with chronic intermittent burning with urination. He does not have any associated lower back or flank pain/discomfort. He denies any visible blood in the urine. Not associated with fevers or chills, nausea/vomiting.       ROS:  ROS:  A complete review of systems was performed.  All systems are negative except for pertinent findings as noted.   ROS  Allergies  Allergen Reactions  . Cephalexin Rash  . Penicillins Rash  . Sulfonamide Derivatives Rash    Outpatient Encounter Medications as of 01/03/2021  Medication Sig  . b complex vitamins tablet Take 1 tablet by mouth daily.  . Cholecalciferol (VITAMIN D3) 2000 UNITS TABS Take 2,000 Units by mouth daily.  . Coenzyme Q10 200 MG capsule Take 200 mg by mouth daily.  [name] gabapentin (NEURONTIN) 100 MG capsule  Take 100 mg by mouth at bedtime.   [name] levothyroxine (SYNTHROID) 88 MCG tablet Take 88 mcg by mouth daily before breakfast.  . loratadine (CLARITIN) 10 MG tablet Take 10 mg by mouth daily.  [name] losartan (COZAAR) 50 MG tablet Take 50 mg by mouth daily.  . meloxicam (MOBIC) 15 MG tablet Take 15 mg by mouth daily as needed for pain.   . pantoprazole (PROTONIX) 40 MG tablet Take 40 mg by mouth daily.  . traMADol (ULTRAM) 50 MG tablet Take 50 mg by mouth every 6 (six) hours as needed for moderate pain.   [name] ZOCOR 40 MG tablet TAKE ONE TABLET BY MOUTH IN THE EVENING. (Patient taking differently: Take 40 mg by mouth every evening.)  . [DISCONTINUED]  levofloxacin (LEVAQUIN) 500 MG tablet Take 1 tablet (500 mg total) by mouth daily.  . [DISCONTINUED] levofloxacin (LEVAQUIN) 500 MG tablet Take 1 tablet (500 mg total) by mouth daily.   No facility-administered encounter medications on file as of 01/03/2021.    Past Medical History:  Diagnosis Date  . Anemia   . Arthritis   . BPH (benign prostatic hyperplasia) 08/1989  . Dyspnea    with exertion  . GERD (gastroesophageal reflux disease) 11/29/1998  . Hyperlipidemia 08/1997  . Hypertension   . Hypothyroidism 1983  . Lesion of bladder   . Pulmonary nodules   . Spermatocele    per Dr. Jeffie Pollock with URO    Past Surgical History:  Procedure Laterality Date  . CATARACT EXTRACTION  03/2003   left eye  . COLONOSCOPY  08/10/09   polyp in ascending colon otherwise normal  . COLONOSCOPY WITH ESOPHAGOGASTRODUODENOSCOPY (EGD) N/A 10/28/2013   Dr. Rourk:Schatzki's ring - not manipulated because no dysphagia currently. Small hiatal hernia.  Gastric and duodenal erosions of uncertain significance-status post biopsy. path with benign findings, no villous atrophy, no H.pylori/TCS:Colonic diverticulosis  . CYSTOSCOPY  08/1989   normal  . CYSTOSCOPY WITH BIOPSY N/A 02/07/2020   Procedure: CYSTOSCOPY WITH BLADDER AND PROSTATIS URETHRAL  BIOPSY WIHT FULGURATION AND INSTILL GEMCITABINE;  Surgeon: Irine Seal, MD;  Location: [facility];  Service: Urology;  Laterality: N/A;  . CYSTOSCOPY WITH BIOPSY Bilateral 08/09/2020   Procedure: CYSTOSCOPY WITH BLADDER BIOPSYAND FULGURATION BILATERAL RETROGRADE S;  Surgeon: Irine Seal, MD;  Location: [facility];  Service: Urology;  Laterality: Bilateral;  . ESOPHAGOGASTRODUODENOSCOPY  10/1999   distal esoph. stricture; sliding H. H.  . LASIK  04/12/2003   right eye  . lumbar microdisectomy  08/2000   Dr. Hal Neer    Social History   Socioeconomic History  . Marital status: Married    Spouse name: Not on file  . Number of children: 1  . Years of  education: Not on file  . Highest education level: Not on file  Occupational History  . Occupation: Music therapist: [employer]    Comment: Music therapist  Tobacco Use  . Smoking status: Never Smoker  . Smokeless tobacco: Never Used  Vaping Use  . Vaping Use: Never used  Substance and Sexual Activity  . Alcohol use: No  . Drug use: No  . Sexual activity: Yes  Other Topics Concern  . Not on file  Social History Narrative   Married and lives with wife   Social Determinants of Health   Financial Resource Strain: Not on file  Food Insecurity: Not on file  Transportation Needs: Not on file  Physical Activity: Not on file  Stress: Not on file  Social Connections: Not on file  Intimate Partner Violence: Not on file    Family History  Problem Relation Age of Onset  . Hypertension Mother   . Hyperlipidemia Mother   . Diabetes Mother 50  . Heart disease Father        CABG X 4  12/2001  . Hyperlipidemia Father   . Hypertension Father   . Heart disease Brother        CABG X 1  . Heart disease Paternal Uncle        MI  . Cancer Maternal Grandfather        colon  . Diabetes Maternal Uncle   . Arthritis Maternal Uncle   . Stroke Paternal Grandfather   . Allergic rhinitis Neg Hx   . Angioedema Neg Hx   . Asthma Neg Hx   . Atopy Neg Hx   . Eczema Neg Hx   . Immunodeficiency Neg Hx   . Urticaria Neg Hx        Objective: Vitals:   01/03/21 1523  BP: 130/83  Pulse: 76     Physical Exam  Lab Results:  PSA PSA  Date Value Ref Range Status  08/05/2011 0.98 0.10 - 4.00 ng/mL Final  07/22/2010 0.85 0.10 - 4.00 ng/mL Final  03/26/2009 1.03 0.10 - 4.00 ng/mL Final   No results found for: TESTOSTERONE    Studies/Results: Results for orders placed or performed in visit on 01/03/21 (from the past 24 hour(s))  Urinalysis, Routine w reflex microscopic     Status: Abnormal   Collection Time: 01/03/21  3:11 PM  Result Value Ref Range   Specific  Gravity, UA 1.030 1.005 - 1.030   pH, UA 6.0 5.0 - 7.5   Color, UA Yellow Yellow   Appearance Ur Clear Clear   Leukocytes,UA Negative Negative   Protein,UA 1+ (A) Negative/Trace   Glucose, UA Negative Negative   Ketones, UA Trace (A) Negative   RBC, UA 1+ (A) Negative   Bilirubin, UA Negative Negative   Urobilinogen, Ur 0.2 0.2 - 1.0 mg/dL   Nitrite, UA Negative Negative   Microscopic Examination See below:    Narrative   Performed at:  [facility] [address], [place], [place]  [id number] Lab Director: Mina Marble MT, Phone:  [phone]  Microscopic Examination     Status: Abnormal   Collection Time: 01/03/21  3:11 PM   Urine  Result Value Ref Range   WBC, UA 0-5 0 - 5 /hpf   RBC 3-10 (A) 0 - 2 /hpf   Epithelial Cells (non renal) 0-10 0 - 10 /hpf   Casts Present (A) None seen /lpf   Cast Type Hyaline casts N/A   Mucus, UA Present Not Estab.   Bacteria, UA Few None seen/Few   Narrative   Performed at:  [facility] [address] [place], [place]  [id number] Lab Director: [place], Phone:  [phone]      Assessment & Plan: Recurrent/Persistent CIS s/p BCG induction.  He has completed a second induction course and on cystoscopy he still  had significant erythema that appeared inflammatory but there was one area on the left that was a little more worrisome but the cytology was negative.   He still has some dysuria with frequency and nocturia but that is improving.   I will get a cytology today.  If the cytology is abnormal, we will need rebiopsy him but if it is ok he will  return for repeat cystoscopy in 6 weeks  OAB.  He is improving with the levaquin and I will go ahead and extend the course for another 2 weeks.       No orders of the defined types were placed in this encounter.    Orders Placed This Encounter  Procedures  . Microscopic Examination  . Urinalysis, Routine w reflex microscopic      Return in about 6 weeks  (around 02/14/2021) for cystoscopy.   Double book if need be. .   CC: Celene Squibb, MD      Irine Seal 01/03/2021 Patient ID: Tina Griffiths, male   DOB: 26-Jun-1956, 65 y.o.   MRN: [id number]

## 2021-01-03 NOTE — Progress Notes (Signed)
Urological Symptom Review  Patient is experiencing the following symptoms: Frequent urination Burning/pain with urination Get up at night to urinate Stream starts and stops Erection problems (male only)   Review of Systems  Gastrointestinal (upper)  : Negative for upper GI symptoms  Gastrointestinal (lower) : Negative for lower GI symptoms  Constitutional : Negative for symptoms  Skin: Negative for skin symptoms  Eyes: Negative for eye symptoms  Ear/Nose/Throat : Negative for Ear/Nose/Throat symptoms  Hematologic/Lymphatic: Negative for Hematologic/Lymphatic symptoms  Cardiovascular : Negative for cardiovascular symptoms  Respiratory : Negative for respiratory symptoms  Endocrine: Negative for endocrine symptoms  Musculoskeletal: Negative for musculoskeletal symptoms  Neurological: Negative for neurological symptoms  Psychologic: Negative for psychiatric symptoms

## 2021-01-07 LAB — CYTOLOGY, URINE

## 2021-01-08 NOTE — Progress Notes (Signed)
Results sent via my chart 

## 2021-02-14 ENCOUNTER — Ambulatory Visit: Payer: Medicare Other | Admitting: Urology

## 2021-02-14 ENCOUNTER — Other Ambulatory Visit: Payer: Self-pay

## 2021-02-14 ENCOUNTER — Encounter: Payer: Self-pay | Admitting: Urology

## 2021-02-14 VITALS — BP 144/76 | HR 77

## 2021-02-14 DIAGNOSIS — R350 Frequency of micturition: Secondary | ICD-10-CM | POA: Diagnosis not present

## 2021-02-14 DIAGNOSIS — D090 Carcinoma in situ of bladder: Secondary | ICD-10-CM

## 2021-02-14 DIAGNOSIS — R300 Dysuria: Secondary | ICD-10-CM

## 2021-02-14 LAB — URINALYSIS, ROUTINE W REFLEX MICROSCOPIC
Bilirubin, UA: NEGATIVE
Glucose, UA: NEGATIVE
Ketones, UA: NEGATIVE
Leukocytes,UA: NEGATIVE
Nitrite, UA: NEGATIVE
Protein,UA: NEGATIVE
Specific Gravity, UA: 1.025 (ref 1.005–1.030)
Urobilinogen, Ur: 1 mg/dL (ref 0.2–1.0)
pH, UA: 6 (ref 5.0–7.5)

## 2021-02-14 LAB — MICROSCOPIC EXAMINATION: Bacteria, UA: NONE SEEN

## 2021-02-14 MED ORDER — CIPROFLOXACIN HCL 500 MG PO TABS
500.0000 mg | ORAL_TABLET | Freq: Once | ORAL | Status: AC
  Administered 2021-02-14: 500 mg via ORAL

## 2021-02-14 NOTE — Progress Notes (Signed)
Subjective:  1. CIS (carcinoma in situ of bladder)   2. Urinary frequency   3. Burning with urination     02/14/21: [name] returns today for repeat cystoscopy.  He had bladder wall erythema following a second induction course of BCG at his last visit on 01/03/21.   A cytology was negative at that visit.  His UA today has 3-10 RBC's.   He continues to have frequency and dysuria despite a course of Levaquin.   01/03/21: [name] returns today in f/u.  He was given Levaquin 500mg  x 7 days in mid April and the burning is improving.  HIs UA today has 3-10 RBC's today.  He continues to have frequency and nocturia that are improving.  He was up 3 x last night.  His last BCG of the second induction course for his persistent CIS was in February.    11/15/20: [name] returns today f/u for his history of CIS.   He had his last BCG about a month ago and still has dysuria, frequency and urgency with nocturia x 5.  He isn't sure he empties.  He has a good stream.  His UA today has 3-10 RBC's.   09/06/20: [name] returns today in f/u from his recent cystoscopy with biopsy and fulguration on 08/09/20.  He was found to have recurrent/persistent CIS in 4/4 biopsy sites on the dome, base and posterior wall of the bladder.  He is having no hematuria or dysuria.  His IPSS is 6.    07/20/20: [name] returns today in f/u for his history of CIS found on TURBT from a left lateral wall lesion on 02/07/20 that was followed by BCG induction treatment that he completed on 04/16/20 in the [place] office. Marland KitchenHe is voiding with reduced LUTS with an IPSS of 9.  He has had no hematuria.   01/16/20: [name] returns today in f/u. His cytology is positive. A CT today showed bilateral renal cysts and prostate enlargement but no bladder lesions or upper tract lesions. His IPSS remains elevated at 24.   01/04/20: [name] returns today in f/u for his LUTS with OAB. He was given a trial of Vesicare on 12/01/19 but didin't tolerate it or notice improvement . His IPSS is 25  with frequency and a sensation of incomplete emptying as primary issues. he has nocturia x 4 and urgency. He has some chronic dysuria and Azo doesn't help. He had no gross hematuria but he has 3-10 RBC's today.   12/01/2019: Seen in the past for ED and balanitis by Dr Jeffie Pollock. He is on Androgel for hypogonadism managed by his primary care physician. Presents today complaining of worsening frequency/urgency and nocturia that has persistently increased since the fall of last year. His PCP has previously trialed him on tamsulosin, Toviaz, and Myrbetriq with patient reporting no notable difference is in voiding symptoms with either of the medications. Not associated with any hesitancy or straining. He tells me tries to drink a lot of water but that seems to only make symptoms worse. Also associated with chronic intermittent burning with urination. He does not have any associated lower back or flank pain/discomfort. He denies any visible blood in the urine. Not associated with fevers or chills, nausea/vomiting.       ROS:  ROS:  A complete review of systems was performed.  All systems are negative except for pertinent findings as noted.   ROS  Allergies  Allergen Reactions   Cephalexin Rash   Penicillins Rash   Sulfonamide Derivatives Rash  Outpatient Encounter Medications as of 02/14/2021  Medication Sig   b complex vitamins tablet Take 1 tablet by mouth daily.   Cholecalciferol (VITAMIN D3) 2000 UNITS TABS Take 2,000 Units by mouth daily.   Coenzyme Q10 200 MG capsule Take 200 mg by mouth daily.   gabapentin (NEURONTIN) 100 MG capsule Take 100 mg by mouth at bedtime.    levothyroxine (SYNTHROID) 88 MCG tablet Take 88 mcg by mouth daily before breakfast.   loratadine (CLARITIN) 10 MG tablet Take 10 mg by mouth daily.   losartan (COZAAR) 50 MG tablet Take 50 mg by mouth daily.   meloxicam (MOBIC) 15 MG tablet Take 15 mg by mouth daily as needed for pain.    pantoprazole (PROTONIX) 40 MG  tablet Take 40 mg by mouth daily.   traMADol (ULTRAM) 50 MG tablet Take 50 mg by mouth every 6 (six) hours as needed for moderate pain.    ZOCOR 40 MG tablet TAKE ONE TABLET BY MOUTH IN THE EVENING. (Patient taking differently: Take 40 mg by mouth every evening.)   [DISCONTINUED] levofloxacin (LEVAQUIN) 500 MG tablet Take 1 tablet (500 mg total) by mouth daily.   [EXPIRED] ciprofloxacin (CIPRO) tablet 500 mg    No facility-administered encounter medications on file as of 02/14/2021.    Past Medical History:  Diagnosis Date   Anemia    Arthritis    BPH (benign prostatic hyperplasia) 08/1989   Dyspnea    with exertion   GERD (gastroesophageal reflux disease) 11/29/1998   Hyperlipidemia 08/1997   Hypertension    Hypothyroidism 1983   Lesion of bladder    Pulmonary nodules    Spermatocele    per Dr. Jeffie Pollock with URO    Past Surgical History:  Procedure Laterality Date   CATARACT EXTRACTION  03/2003   left eye   COLONOSCOPY  08/10/09   polyp in ascending colon otherwise normal   COLONOSCOPY WITH ESOPHAGOGASTRODUODENOSCOPY (EGD) N/A 10/28/2013   Dr. Rourk:Schatzki's ring - not manipulated because no dysphagia currently. Small hiatal hernia.  Gastric and duodenal erosions of uncertain significance-status post biopsy. path with benign findings, no villous atrophy, no H.pylori/TCS:Colonic diverticulosis   CYSTOSCOPY  08/1989   normal   CYSTOSCOPY WITH BIOPSY N/A 02/07/2020   Procedure: CYSTOSCOPY WITH BLADDER AND PROSTATIS URETHRAL  BIOPSY WIHT FULGURATION AND INSTILL GEMCITABINE;  Surgeon: Irine Seal, MD;  Location: [facility];  Service: Urology;  Laterality: N/A;   CYSTOSCOPY WITH BIOPSY Bilateral 08/09/2020   Procedure: CYSTOSCOPY WITH BLADDER BIOPSYAND FULGURATION BILATERAL RETROGRADE S;  Surgeon: Irine Seal, MD;  Location: [facility];  Service: Urology;  Laterality: Bilateral;   ESOPHAGOGASTRODUODENOSCOPY  10/1999   distal esoph. stricture; sliding H. H.   LASIK   04/12/2003   right eye   lumbar microdisectomy  08/2000   Dr. Hal Neer    Social History   Socioeconomic History   Marital status: Married    Spouse name: Not on file   Number of children: 1   Years of education: Not on file   Highest education level: Not on file  Occupational History   Occupation: Music therapist: [employer]    Comment: Music therapist  Tobacco Use   Smoking status: Never   Smokeless tobacco: Never  Vaping Use   Vaping Use: Never used  Substance and Sexual Activity   Alcohol use: No   Drug use: No   Sexual activity: Yes  Other Topics Concern   Not on file  Social History Narrative  Married and lives with wife   Social Determinants of Health   Financial Resource Strain: Not on file  Food Insecurity: Not on file  Transportation Needs: Not on file  Physical Activity: Not on file  Stress: Not on file  Social Connections: Not on file  Intimate Partner Violence: Not on file    Family History  Problem Relation Age of Onset   Hypertension Mother    Hyperlipidemia Mother    Diabetes Mother 46   Heart disease Father        CABG X 4  12/2001   Hyperlipidemia Father    Hypertension Father    Heart disease Brother        CABG X 1   Heart disease Paternal Uncle        MI   Cancer Maternal Grandfather        colon   Diabetes Maternal Uncle    Arthritis Maternal Uncle    Stroke Paternal Grandfather    Allergic rhinitis Neg Hx    Angioedema Neg Hx    Asthma Neg Hx    Atopy Neg Hx    Eczema Neg Hx    Immunodeficiency Neg Hx    Urticaria Neg Hx        Objective: Vitals:   02/14/21 1341  BP: (!) 144/76  Pulse: 77     Physical Exam  Lab Results:  PSA PSA  Date Value Ref Range Status  08/05/2011 0.98 0.10 - 4.00 ng/mL Final  07/22/2010 0.85 0.10 - 4.00 ng/mL Final  03/26/2009 1.03 0.10 - 4.00 ng/mL Final   No results found for: TESTOSTERONE    Studies/Results: Results for orders placed or performed in visit on  02/14/21 (from the past 24 hour(s))  Urinalysis, Routine w reflex microscopic     Status: Abnormal   Collection Time: 02/14/21  1:57 PM  Result Value Ref Range   Specific Gravity, UA 1.025 1.005 - 1.030   pH, UA 6.0 5.0 - 7.5   Color, UA Yellow Yellow   Appearance Ur Clear Clear   Leukocytes,UA Negative Negative   Protein,UA Negative Negative/Trace   Glucose, UA Negative Negative   Ketones, UA Negative Negative   RBC, UA Trace (A) Negative   Bilirubin, UA Negative Negative   Urobilinogen, Ur 1.0 0.2 - 1.0 mg/dL   Nitrite, UA Negative Negative   Microscopic Examination See below:    Narrative   Performed at:  [facility] [address] [place], [place]  [id number] Lab Director: Mina Marble MT, Phone:  [phone]  Microscopic Examination     Status: Abnormal   Collection Time: 02/14/21  1:57 PM   Urine  Result Value Ref Range   WBC, UA 0-5 0 - 5 /hpf   RBC 3-10 (A) 0 - 2 /hpf   Epithelial Cells (non renal) 0-10 0 - 10 /hpf   Mucus, UA Present Not Estab.   Bacteria, UA None seen None seen/Few   Narrative   Performed at:  [facility] [address], [place], [place]  [id number] Lab Director: [place], Phone:  [phone]   Procedure: Flexible cystoscopy.  He was prepped with betadine and the urethra was instilled with 2% lidocaine jelly 9ml.  He was given Cipro 500mg  po.  The cystoscope was easily passed.  The urethra is normal.  The prostate is 3cm with bilobar hyperplasia with obstruction.  The bladder has mild trabeculation.  There is patchy erythema on the dome that could  be inflammatory or residual/recurrent CIS.  It is associated with the prior resection scar.  The total area is >5cm.  No papillary tumors are seen. The UO's are unremarkable.    Assessment & Plan: Recurrent/Persistent CIS s/p BCG induction.  He has completed a second induction course and on cystoscopy he still  has significant erythema.  This could all be  inflammatory and the cytology was negative but I feel a repeat biopsy is indicated at this point.  I reviewed the risks of bleeding, infection, bladder injury, need for a foley and anesthetic complications.   I will get him set up ASAP.    OAB.  He is improved to a point with the levaquin but has been off of it for about a month and the dysuria persists.   His UA only has 3-10 RBC's.  I recommended he try azo again and I will defer further treatment until we complete the biopsy.  If it is inflammatory, he will need to be considered for antitubercular therapy.    Meds ordered this encounter  Medications   ciprofloxacin (CIPRO) tablet 500 mg      Orders Placed This Encounter  Procedures   Microscopic Examination   Urinalysis, Routine w reflex microscopic      Return for asap schedule surgery. .   CC: Celene Squibb, MD      Irine Seal 02/14/2021 Patient ID: Isaac Guerrero, male   DOB: Aug 07, 1956, 65 y.o.   MRN: [id number]

## 2021-02-14 NOTE — Progress Notes (Signed)
Urological Symptom Review  Patient is experiencing the following symptoms: Frequent urination Hard to postpone urination Burning/pain with urination Get up at night to urinate Leakage of urine Stream starts and stops Erection problems (male only)   Review of Systems  Gastrointestinal (upper)  : Negative for upper GI symptoms  Gastrointestinal (lower) : Negative for lower GI symptoms  Constitutional : Negative for symptoms  Skin: Negative for skin symptoms  Eyes: Negative for eye symptoms  Ear/Nose/Throat : Negative for Ear/Nose/Throat symptoms  Hematologic/Lymphatic: Negative for Hematologic/Lymphatic symptoms  Cardiovascular : Negative for cardiovascular symptoms  Respiratory : Negative for respiratory symptoms  Endocrine: Negative for endocrine symptoms  Musculoskeletal: Negative for musculoskeletal symptoms  Neurological: Negative for neurological symptoms  Psychologic: Negative for psychiatric symptoms

## 2021-02-14 NOTE — H&P (View-Only) (Signed)
Subjective:  1. CIS (carcinoma in situ of bladder)   2. Urinary frequency   3. Burning with urination     02/14/21: [name] returns today for repeat cystoscopy.  He had bladder wall erythema following a second induction course of BCG at his last visit on 01/03/21.   A cytology was negative at that visit.  His UA today has 3-10 RBC's.   He continues to have frequency and dysuria despite a course of Levaquin.   01/03/21: [name] returns today in f/u.  He was given Levaquin 500mg  x 7 days in mid April and the burning is improving.  HIs UA today has 3-10 RBC's today.  He continues to have frequency and nocturia that are improving.  He was up 3 x last night.  His last BCG of the second induction course for his persistent CIS was in February.    11/15/20: [name] returns today f/u for his history of CIS.   He had his last BCG about a month ago and still has dysuria, frequency and urgency with nocturia x 5.  He isn't sure he empties.  He has a good stream.  His UA today has 3-10 RBC's.   09/06/20: [name] returns today in f/u from his recent cystoscopy with biopsy and fulguration on 08/09/20.  He was found to have recurrent/persistent CIS in 4/4 biopsy sites on the dome, base and posterior wall of the bladder.  He is having no hematuria or dysuria.  His IPSS is 6.    07/20/20: [name] returns today in f/u for his history of CIS found on TURBT from a left lateral wall lesion on 02/07/20 that was followed by BCG induction treatment that he completed on 04/16/20 in the [place] office. Marland KitchenHe is voiding with reduced LUTS with an IPSS of 9.  He has had no hematuria.   01/16/20: [name] returns today in f/u. His cytology is positive. A CT today showed bilateral renal cysts and prostate enlargement but no bladder lesions or upper tract lesions. His IPSS remains elevated at 24.   01/04/20: [name] returns today in f/u for his LUTS with OAB. He was given a trial of Vesicare on 12/01/19 but didin't tolerate it or notice improvement . His IPSS is 25  with frequency and a sensation of incomplete emptying as primary issues. he has nocturia x 4 and urgency. He has some chronic dysuria and Azo doesn't help. He had no gross hematuria but he has 3-10 RBC's today.   12/01/2019: Seen in the past for ED and balanitis by Dr Jeffie Pollock. He is on Androgel for hypogonadism managed by his primary care physician. Presents today complaining of worsening frequency/urgency and nocturia that has persistently increased since the fall of last year. His PCP has previously trialed him on tamsulosin, Toviaz, and Myrbetriq with patient reporting no notable difference is in voiding symptoms with either of the medications. Not associated with any hesitancy or straining. He tells me tries to drink a lot of water but that seems to only make symptoms worse. Also associated with chronic intermittent burning with urination. He does not have any associated lower back or flank pain/discomfort. He denies any visible blood in the urine. Not associated with fevers or chills, nausea/vomiting.       ROS:  ROS:  A complete review of systems was performed.  All systems are negative except for pertinent findings as noted.   ROS  Allergies  Allergen Reactions   Cephalexin Rash   Penicillins Rash   Sulfonamide Derivatives Rash  Outpatient Encounter Medications as of 02/14/2021  Medication Sig   b complex vitamins tablet Take 1 tablet by mouth daily.   Cholecalciferol (VITAMIN D3) 2000 UNITS TABS Take 2,000 Units by mouth daily.   Coenzyme Q10 200 MG capsule Take 200 mg by mouth daily.   gabapentin (NEURONTIN) 100 MG capsule Take 100 mg by mouth at bedtime.    levothyroxine (SYNTHROID) 88 MCG tablet Take 88 mcg by mouth daily before breakfast.   loratadine (CLARITIN) 10 MG tablet Take 10 mg by mouth daily.   losartan (COZAAR) 50 MG tablet Take 50 mg by mouth daily.   meloxicam (MOBIC) 15 MG tablet Take 15 mg by mouth daily as needed for pain.    pantoprazole (PROTONIX) 40 MG  tablet Take 40 mg by mouth daily.   traMADol (ULTRAM) 50 MG tablet Take 50 mg by mouth every 6 (six) hours as needed for moderate pain.    ZOCOR 40 MG tablet TAKE ONE TABLET BY MOUTH IN THE EVENING. (Patient taking differently: Take 40 mg by mouth every evening.)   [DISCONTINUED] levofloxacin (LEVAQUIN) 500 MG tablet Take 1 tablet (500 mg total) by mouth daily.   [EXPIRED] ciprofloxacin (CIPRO) tablet 500 mg    No facility-administered encounter medications on file as of 02/14/2021.    Past Medical History:  Diagnosis Date   Anemia    Arthritis    BPH (benign prostatic hyperplasia) 08/1989   Dyspnea    with exertion   GERD (gastroesophageal reflux disease) 11/29/1998   Hyperlipidemia 08/1997   Hypertension    Hypothyroidism 1983   Lesion of bladder    Pulmonary nodules    Spermatocele    per Dr. Jeffie Pollock with URO    Past Surgical History:  Procedure Laterality Date   CATARACT EXTRACTION  03/2003   left eye   COLONOSCOPY  08/10/09   polyp in ascending colon otherwise normal   COLONOSCOPY WITH ESOPHAGOGASTRODUODENOSCOPY (EGD) N/A 10/28/2013   Dr. Rourk:Schatzki's ring - not manipulated because no dysphagia currently. Small hiatal hernia.  Gastric and duodenal erosions of uncertain significance-status post biopsy. path with benign findings, no villous atrophy, no H.pylori/TCS:Colonic diverticulosis   CYSTOSCOPY  08/1989   normal   CYSTOSCOPY WITH BIOPSY N/A 02/07/2020   Procedure: CYSTOSCOPY WITH BLADDER AND PROSTATIS URETHRAL  BIOPSY WIHT FULGURATION AND INSTILL GEMCITABINE;  Surgeon: Irine Seal, MD;  Location: [facility];  Service: Urology;  Laterality: N/A;   CYSTOSCOPY WITH BIOPSY Bilateral 08/09/2020   Procedure: CYSTOSCOPY WITH BLADDER BIOPSYAND FULGURATION BILATERAL RETROGRADE S;  Surgeon: Irine Seal, MD;  Location: [facility];  Service: Urology;  Laterality: Bilateral;   ESOPHAGOGASTRODUODENOSCOPY  10/1999   distal esoph. stricture; sliding H. H.   LASIK   04/12/2003   right eye   lumbar microdisectomy  08/2000   Dr. Hal Neer    Social History   Socioeconomic History   Marital status: Married    Spouse name: Not on file   Number of children: 1   Years of education: Not on file   Highest education level: Not on file  Occupational History   Occupation: Music therapist: [employer]    Comment: Music therapist  Tobacco Use   Smoking status: Never   Smokeless tobacco: Never  Vaping Use   Vaping Use: Never used  Substance and Sexual Activity   Alcohol use: No   Drug use: No   Sexual activity: Yes  Other Topics Concern   Not on file  Social History Narrative  Married and lives with wife   Social Determinants of Health   Financial Resource Strain: Not on file  Food Insecurity: Not on file  Transportation Needs: Not on file  Physical Activity: Not on file  Stress: Not on file  Social Connections: Not on file  Intimate Partner Violence: Not on file    Family History  Problem Relation Age of Onset   Hypertension Mother    Hyperlipidemia Mother    Diabetes Mother 52   Heart disease Father        CABG X 4  12/2001   Hyperlipidemia Father    Hypertension Father    Heart disease Brother        CABG X 1   Heart disease Paternal Uncle        MI   Cancer Maternal Grandfather        colon   Diabetes Maternal Uncle    Arthritis Maternal Uncle    Stroke Paternal Grandfather    Allergic rhinitis Neg Hx    Angioedema Neg Hx    Asthma Neg Hx    Atopy Neg Hx    Eczema Neg Hx    Immunodeficiency Neg Hx    Urticaria Neg Hx        Objective: Vitals:   02/14/21 1341  BP: (!) 144/76  Pulse: 77     Physical Exam  Lab Results:  PSA PSA  Date Value Ref Range Status  08/05/2011 0.98 0.10 - 4.00 ng/mL Final  07/22/2010 0.85 0.10 - 4.00 ng/mL Final  03/26/2009 1.03 0.10 - 4.00 ng/mL Final   No results found for: TESTOSTERONE    Studies/Results: Results for orders placed or performed in visit on  02/14/21 (from the past 24 hour(s))  Urinalysis, Routine w reflex microscopic     Status: Abnormal   Collection Time: 02/14/21  1:57 PM  Result Value Ref Range   Specific Gravity, UA 1.025 1.005 - 1.030   pH, UA 6.0 5.0 - 7.5   Color, UA Yellow Yellow   Appearance Ur Clear Clear   Leukocytes,UA Negative Negative   Protein,UA Negative Negative/Trace   Glucose, UA Negative Negative   Ketones, UA Negative Negative   RBC, UA Trace (A) Negative   Bilirubin, UA Negative Negative   Urobilinogen, Ur 1.0 0.2 - 1.0 mg/dL   Nitrite, UA Negative Negative   Microscopic Examination See below:    Narrative   Performed at:  [facility] [address], [place], [place]  [id number] Lab Director: Mina Marble [place], Phone:  [phone]  Microscopic Examination     Status: Abnormal   Collection Time: 02/14/21  1:57 PM   Urine  Result Value Ref Range   WBC, UA 0-5 0 - 5 /hpf   RBC 3-10 (A) 0 - 2 /hpf   Epithelial Cells (non renal) 0-10 0 - 10 /hpf   Mucus, UA Present Not Estab.   Bacteria, UA None seen None seen/Few   Narrative   Performed at:  [facility] [address] [place], [place]  [id number] Lab Director: Lexa, Phone:  [phone]   Procedure: Flexible cystoscopy.  He was prepped with betadine and the urethra was instilled with 2% lidocaine jelly 25ml.  He was given Cipro 500mg  po.  The cystoscope was easily passed.  The urethra is normal.  The prostate is 3cm with bilobar hyperplasia with obstruction.  The bladder has mild trabeculation.  There is patchy erythema on the dome that could  be inflammatory or residual/recurrent CIS.  It is associated with the prior resection scar.  The total area is >5cm.  No papillary tumors are seen. The UO's are unremarkable.    Assessment & Plan: Recurrent/Persistent CIS s/p BCG induction.  He has completed a second induction course and on cystoscopy he still  has significant erythema.  This could all be  inflammatory and the cytology was negative but I feel a repeat biopsy is indicated at this point.  I reviewed the risks of bleeding, infection, bladder injury, need for a foley and anesthetic complications.   I will get him set up ASAP.    OAB.  He is improved to a point with the levaquin but has been off of it for about a month and the dysuria persists.   His UA only has 3-10 RBC's.  I recommended he try azo again and I will defer further treatment until we complete the biopsy.  If it is inflammatory, he will need to be considered for antitubercular therapy.    Meds ordered this encounter  Medications   ciprofloxacin (CIPRO) tablet 500 mg      Orders Placed This Encounter  Procedures   Microscopic Examination   Urinalysis, Routine w reflex microscopic      Return for asap schedule surgery. .   CC: Celene Squibb, MD      Irine Seal 02/14/2021 Patient ID: Isaac Guerrero, male   DOB: 1955/10/15, 65 y.o.   MRN: [id number]

## 2021-02-14 NOTE — Progress Notes (Signed)
Surgery sheet faxed with clinicals to [facility] per Dr. Jeffie Pollock

## 2021-02-18 ENCOUNTER — Other Ambulatory Visit: Payer: Self-pay | Admitting: Urology

## 2021-02-28 ENCOUNTER — Encounter (HOSPITAL_BASED_OUTPATIENT_CLINIC_OR_DEPARTMENT_OTHER): Payer: Self-pay | Admitting: Urology

## 2021-02-28 ENCOUNTER — Other Ambulatory Visit: Payer: Self-pay

## 2021-02-28 DIAGNOSIS — N329 Bladder disorder, unspecified: Secondary | ICD-10-CM

## 2021-02-28 DIAGNOSIS — R350 Frequency of micturition: Secondary | ICD-10-CM

## 2021-02-28 HISTORY — DX: Frequency of micturition: R35.0

## 2021-02-28 HISTORY — DX: Bladder disorder, unspecified: N32.9

## 2021-02-28 NOTE — Progress Notes (Signed)
Spoke w/ via phone for pre-op interview---pt Lab needs dos---- I stat              Lab results------ekg 10-25-2020 epic, chest xray 08-06-2020 epic, lov pulmonary dr Melvyn Novas 10-25-2020 epic COVID test -----patient states asymptomatic no test needed Arrive at -------530 am 03-05-2021 NPO after MN NO Solid Food.  Clear liquids from MN until---430 am then npo Med rec completed Medications to take morning of surgery ----- Diabetic medication -----levothyroxine, loratadine, pantaprazole Patient instructed no nail polish to be worn day of surgery Patient instructed to bring photo id and insurance card day of surgery Patient aware to have Driver (ride ) / caregiver   wife wanda  for 24 hours after surgery  Patient Special Instructions -----none Pre-Op special Istructions -----none Patient verbalized understanding of instructions that were given at this phone interview. Patient denies shortness of breath, chest pain, fever, cough at this phone interview.

## 2021-03-04 NOTE — Anesthesia Preprocedure Evaluation (Addendum)
Anesthesia Evaluation  Patient identified by MRN, date of birth, ID band Patient awake    Reviewed: Allergy & Precautions, NPO status , Patient's Chart, lab work & pertinent test results  Airway Mallampati: II  TM Distance: >3 FB Neck ROM: Full    Dental no notable dental hx. (+) Teeth Intact, Dental Advisory Given   Pulmonary neg pulmonary ROS,    Pulmonary exam normal breath sounds clear to auscultation       Cardiovascular hypertension, Pt. on medications + DOE  Normal cardiovascular exam Rhythm:Regular Rate:Normal     Neuro/Psych negative neurological ROS  negative psych ROS   GI/Hepatic Neg liver ROS, GERD  ,  Endo/Other  Hypothyroidism   Renal/GU Renal diseaseCIS of Bladder  K+ 3.8     Musculoskeletal   Abdominal   Peds  Hematology Hgb 14.5   Anesthesia Other Findings   Reproductive/Obstetrics                            Anesthesia Physical Anesthesia Plan  ASA: 3  Anesthesia Plan: General   Post-op Pain Management:    Induction: Intravenous  PONV Risk Score and Plan: 3 and Treatment may vary due to age or medical condition, Ondansetron and Midazolam  Airway Management Planned: LMA  Additional Equipment: None  Intra-op Plan:   Post-operative Plan:   Informed Consent: I have reviewed the patients History and Physical, chart, labs and discussed the procedure including the risks, benefits and alternatives for the proposed anesthesia with the patient or authorized representative who has indicated his/her understanding and acceptance.     Dental advisory given  Plan Discussed with: CRNA  Anesthesia Plan Comments: (GA LMA)     Anesthesia Quick Evaluation

## 2021-03-05 ENCOUNTER — Ambulatory Visit (HOSPITAL_BASED_OUTPATIENT_CLINIC_OR_DEPARTMENT_OTHER)
Admission: RE | Admit: 2021-03-05 | Discharge: 2021-03-05 | Disposition: A | Discharge status: Home / Self Care | Payer: Medicare Other | Attending: Urology | Admitting: Urology

## 2021-03-05 ENCOUNTER — Encounter (HOSPITAL_BASED_OUTPATIENT_CLINIC_OR_DEPARTMENT_OTHER)
Admission: RE | Disposition: A | Discharge status: Home / Self Care | Payer: Self-pay | Source: Home / Self Care | Attending: Urology

## 2021-03-05 ENCOUNTER — Encounter (HOSPITAL_BASED_OUTPATIENT_CLINIC_OR_DEPARTMENT_OTHER): Payer: Self-pay | Admitting: Urology

## 2021-03-05 ENCOUNTER — Ambulatory Visit (HOSPITAL_BASED_OUTPATIENT_CLINIC_OR_DEPARTMENT_OTHER): Payer: Medicare Other | Admitting: Anesthesiology

## 2021-03-05 DIAGNOSIS — Z881 Allergy status to other antibiotic agents status: Secondary | ICD-10-CM | POA: Diagnosis not present

## 2021-03-05 DIAGNOSIS — Z791 Long term (current) use of non-steroidal anti-inflammatories (NSAID): Secondary | ICD-10-CM | POA: Diagnosis not present

## 2021-03-05 DIAGNOSIS — Z882 Allergy status to sulfonamides status: Secondary | ICD-10-CM | POA: Insufficient documentation

## 2021-03-05 DIAGNOSIS — Z79890 Hormone replacement therapy (postmenopausal): Secondary | ICD-10-CM | POA: Diagnosis not present

## 2021-03-05 DIAGNOSIS — Z833 Family history of diabetes mellitus: Secondary | ICD-10-CM | POA: Insufficient documentation

## 2021-03-05 DIAGNOSIS — Z800 Family history of malignant neoplasm of digestive organs: Secondary | ICD-10-CM | POA: Diagnosis not present

## 2021-03-05 DIAGNOSIS — N329 Bladder disorder, unspecified: Secondary | ICD-10-CM | POA: Diagnosis not present

## 2021-03-05 DIAGNOSIS — D303 Benign neoplasm of bladder: Secondary | ICD-10-CM | POA: Diagnosis not present

## 2021-03-05 DIAGNOSIS — M1990 Unspecified osteoarthritis, unspecified site: Secondary | ICD-10-CM | POA: Diagnosis not present

## 2021-03-05 DIAGNOSIS — Z8349 Family history of other endocrine, nutritional and metabolic diseases: Secondary | ICD-10-CM | POA: Insufficient documentation

## 2021-03-05 DIAGNOSIS — N3289 Other specified disorders of bladder: Secondary | ICD-10-CM | POA: Diagnosis not present

## 2021-03-05 DIAGNOSIS — Z8261 Family history of arthritis: Secondary | ICD-10-CM | POA: Insufficient documentation

## 2021-03-05 DIAGNOSIS — Z8249 Family history of ischemic heart disease and other diseases of the circulatory system: Secondary | ICD-10-CM | POA: Insufficient documentation

## 2021-03-05 DIAGNOSIS — Z79899 Other long term (current) drug therapy: Secondary | ICD-10-CM | POA: Diagnosis not present

## 2021-03-05 DIAGNOSIS — Z8551 Personal history of malignant neoplasm of bladder: Secondary | ICD-10-CM | POA: Diagnosis not present

## 2021-03-05 DIAGNOSIS — Z823 Family history of stroke: Secondary | ICD-10-CM | POA: Insufficient documentation

## 2021-03-05 DIAGNOSIS — E039 Hypothyroidism, unspecified: Secondary | ICD-10-CM | POA: Diagnosis not present

## 2021-03-05 DIAGNOSIS — R7303 Prediabetes: Secondary | ICD-10-CM | POA: Diagnosis not present

## 2021-03-05 DIAGNOSIS — Z880 Allergy status to penicillin: Secondary | ICD-10-CM | POA: Insufficient documentation

## 2021-03-05 DIAGNOSIS — I10 Essential (primary) hypertension: Secondary | ICD-10-CM | POA: Diagnosis not present

## 2021-03-05 DIAGNOSIS — N3090 Cystitis, unspecified without hematuria: Secondary | ICD-10-CM | POA: Diagnosis not present

## 2021-03-05 HISTORY — PX: CYSTOSCOPY WITH BIOPSY: SHX5122

## 2021-03-05 HISTORY — DX: Prediabetes: R73.03

## 2021-03-05 LAB — POCT I-STAT, CHEM 8
BUN: 21 mg/dL (ref 8–23)
Calcium, Ion: 1.28 mmol/L (ref 1.15–1.40)
Chloride: 102 mmol/L (ref 98–111)
Creatinine, Ser: 1.1 mg/dL (ref 0.61–1.24)
Glucose, Bld: 137 mg/dL — ABNORMAL HIGH (ref 70–99)
HCT: 43 % (ref 39.0–52.0)
Hemoglobin: 14.6 g/dL (ref 13.0–17.0)
Potassium: 3.8 mmol/L (ref 3.5–5.1)
Sodium: 140 mmol/L (ref 135–145)
TCO2: 24 mmol/L (ref 22–32)

## 2021-03-05 SURGERY — CYSTOSCOPY, WITH BIOPSY
Anesthesia: General | Site: Bladder

## 2021-03-05 MED ORDER — STERILE WATER FOR IRRIGATION IR SOLN
Status: DC | PRN
  Administered 2021-03-05: 3000 mL via INTRAVESICAL

## 2021-03-05 MED ORDER — MIDAZOLAM HCL 5 MG/5ML IJ SOLN
INTRAMUSCULAR | Status: DC | PRN
  Administered 2021-03-05: 2 mg via INTRAVENOUS

## 2021-03-05 MED ORDER — SODIUM CHLORIDE 0.9 % IR SOLN
Status: DC | PRN
  Administered 2021-03-05 (×2): 3000 mL via INTRAVESICAL

## 2021-03-05 MED ORDER — SODIUM CHLORIDE 0.9% FLUSH: 3.0000 mL | Freq: Two times a day (BID) | INTRAVENOUS | Status: DC

## 2021-03-05 MED ORDER — PROPOFOL 10 MG/ML IV BOLUS
INTRAVENOUS | Status: DC | PRN
  Administered 2021-03-05: 150 mg via INTRAVENOUS
  Administered 2021-03-05 (×3): 50 mg via INTRAVENOUS

## 2021-03-05 MED ORDER — FENTANYL CITRATE (PF) 100 MCG/2ML IJ SOLN
INTRAMUSCULAR | Status: AC
  Filled 2021-03-05: qty 2

## 2021-03-05 MED ORDER — DEXAMETHASONE SODIUM PHOSPHATE 4 MG/ML IJ SOLN
INTRAMUSCULAR | Status: DC | PRN
  Administered 2021-03-05: 5 mg via INTRAVENOUS

## 2021-03-05 MED ORDER — ONDANSETRON HCL 4 MG/2ML IJ SOLN
INTRAMUSCULAR | Status: AC
  Filled 2021-03-05: qty 2

## 2021-03-05 MED ORDER — CIPROFLOXACIN IN D5W 400 MG/200ML IV SOLN
INTRAVENOUS | Status: AC
  Filled 2021-03-05: qty 200

## 2021-03-05 MED ORDER — FENTANYL CITRATE (PF) 100 MCG/2ML IJ SOLN
INTRAMUSCULAR | Status: DC | PRN
  Administered 2021-03-05 (×2): 25 ug via INTRAVENOUS
  Administered 2021-03-05: 75 ug via INTRAVENOUS
  Administered 2021-03-05 (×3): 25 ug via INTRAVENOUS

## 2021-03-05 MED ORDER — LACTATED RINGERS IV SOLN: INTRAVENOUS | Status: DC

## 2021-03-05 MED ORDER — CIPROFLOXACIN IN D5W 400 MG/200ML IV SOLN
400.0000 mg | INTRAVENOUS | Status: AC
  Administered 2021-03-05: 400 mg via INTRAVENOUS

## 2021-03-05 MED ORDER — LIDOCAINE 2% (20 MG/ML) 5 ML SYRINGE
INTRAMUSCULAR | Status: DC | PRN
  Administered 2021-03-05: 100 mg via INTRAVENOUS

## 2021-03-05 MED ORDER — ONDANSETRON HCL 4 MG/2ML IJ SOLN
INTRAMUSCULAR | Status: DC | PRN
  Administered 2021-03-05: 4 mg via INTRAVENOUS

## 2021-03-05 MED ORDER — MIDAZOLAM HCL 2 MG/2ML IJ SOLN
INTRAMUSCULAR | Status: AC
  Filled 2021-03-05: qty 2

## 2021-03-05 MED ORDER — DEXAMETHASONE SODIUM PHOSPHATE 10 MG/ML IJ SOLN
INTRAMUSCULAR | Status: AC
  Filled 2021-03-05: qty 1

## 2021-03-05 MED ORDER — PROPOFOL 10 MG/ML IV BOLUS
INTRAVENOUS | Status: AC
  Filled 2021-03-05: qty 40

## 2021-03-05 SURGICAL SUPPLY — 21 items
BAG DRAIN URO-CYSTO SKYTR STRL (DRAIN) ×2 IMPLANT
BAG DRN UROCATH (DRAIN) ×1
CATH FOLEY 2WAY SLVR  5CC 16FR (CATHETERS)
CATH FOLEY 2WAY SLVR 5CC 16FR (CATHETERS) IMPLANT
CLOTH BEACON ORANGE TIMEOUT ST (SAFETY) ×2 IMPLANT
ELECT REM PT RETURN 9FT ADLT (ELECTROSURGICAL) ×2
ELECTRODE REM PT RTRN 9FT ADLT (ELECTROSURGICAL) ×1 IMPLANT
GLOVE SURG POLYISO LF SZ8 (GLOVE) ×2 IMPLANT
GOWN STRL REUS W/TWL LRG LVL3 (GOWN DISPOSABLE) ×4 IMPLANT
IV NS IRRIG 3000ML ARTHROMATIC (IV SOLUTION) ×2 IMPLANT
KIT TURNOVER CYSTO (KITS) ×2 IMPLANT
LOOP CUT BIPOLAR 24F LRG (ELECTROSURGICAL) ×1 IMPLANT
MANIFOLD NEPTUNE II (INSTRUMENTS) ×2 IMPLANT
NDL SAFETY ECLIPSE 18X1.5 (NEEDLE) IMPLANT
NEEDLE HYPO 18GX1.5 SHARP (NEEDLE)
NEEDLE HYPO 22GX1.5 SAFETY (NEEDLE) IMPLANT
NS IRRIG 500ML POUR BTL (IV SOLUTION) IMPLANT
PACK CYSTO (CUSTOM PROCEDURE TRAY) ×2 IMPLANT
SYR 20ML LL LF (SYRINGE) IMPLANT
TUBE CONNECTING 12X1/4 (SUCTIONS) ×2 IMPLANT
WATER STERILE IRR 3000ML UROMA (IV SOLUTION) ×2 IMPLANT

## 2021-03-05 NOTE — Discharge Instructions (Signed)
CYSTOSCOPY HOME CARE INSTRUCTIONS  Activity: Rest for the remainder of the day.  Do not drive or operate equipment today.  You may resume normal activities in one to two days as instructed by your physician.   Meals: Drink plenty of liquids and eat light foods such as gelatin or soup this evening.  You may return to a normal meal plan tomorrow.  Return to Work: You may return to work in one to two days or as instructed by your physician.  Special Instructions / Symptoms: Call your physician if any of these symptoms occur:   -persistent or heavy bleeding  -bleeding which continues after first few urination  -large blood clots that are difficult to pass  -urine stream diminishes or stops completely  -fever equal to or higher than 101 degrees Farenheit.  -cloudy urine with a strong, foul odor  -severe pain  Females should always wipe from front to back after elimination.  You may feel some burning pain when you urinate.  This should disappear with time.  Applying moist heat to the lower abdomen or a hot tub bath may help relieve the pain. \  Follow-Up / Date of Return Visit to Your Physician:  as instructed Call for an appointment to arrange follow-up.   Post Anesthesia Home Care Instructions  Activity: Get plenty of rest for the remainder of the day. A responsible individual must stay with you for 24 hours following the procedure.  For the next 24 hours, DO NOT: -Drive a car -Paediatric nurse -Drink alcoholic beverages -Take any medication unless instructed by your physician -Make any legal decisions or sign important papers.  Meals: Start with liquid foods such as gelatin or soup. Progress to regular foods as tolerated. Avoid greasy, spicy, heavy foods. If nausea and/or vomiting occur, drink only clear liquids until the nausea and/or vomiting subsides. Call your physician if vomiting continues.  Special Instructions/Symptoms: Your throat may feel dry or sore from the  anesthesia or the breathing tube placed in your throat during surgery. If this causes discomfort, gargle with warm salt water. The discomfort should disappear within 24 hours.  If you had a scopolamine patch placed behind your ear for the management of post- operative nausea and/or vomiting:  1. The medication in the patch is effective for 72 hours, after which it should be removed.  Wrap patch in a tissue and discard in the trash. Wash hands thoroughly with soap and water. 2. You may remove the patch earlier than 72 hours if you experience unpleasant side effects which may include dry mouth, dizziness or visual disturbances. 3. Avoid touching the patch. Wash your hands with soap and water after contact with the patch.

## 2021-03-05 NOTE — Transfer of Care (Signed)
Immediate Anesthesia Transfer of Care Note  Patient: Isaac Guerrero  Procedure(s) Performed: Procedure(s) (LRB): CYSTOSCOPY WITH BIOPSY FULGURATION (N/A)  Patient Location: PACU  Anesthesia Type: General  Level of Consciousness: awake, sedated, patient cooperative and responds to stimulation  Airway & Oxygen Therapy: Patient Spontanous Breathing and Patient connected to [place] 02 and soft FM   Post-op Assessment: Report given to PACU RN, Post -op Vital signs reviewed and stable and Patient moving all extremities  Post vital signs: Reviewed and stable  Complications: No apparent anesthesia complications

## 2021-03-05 NOTE — Anesthesia Procedure Notes (Signed)
Procedure Name: LMA Insertion Date/Time: 03/05/2021 7:33 AM Performed by: Justice Rocher, CRNA Pre-anesthesia Checklist: Patient identified, Emergency Drugs available, Suction available, Patient being monitored and Timeout performed Patient Re-evaluated:Patient Re-evaluated prior to induction Oxygen Delivery Method: Circle system utilized Preoxygenation: Pre-oxygenation with 100% oxygen Induction Type: IV induction Ventilation: Mask ventilation without difficulty LMA: LMA inserted LMA Size: 4.0 Number of attempts: 2 Airway Equipment and Method: Bite block Placement Confirmation: positive ETCO2 Tube secured with: Tape Dental Injury: Teeth and Oropharynx as per pre-operative assessment  Comments: Placed # 5 LMA - difficulty with seal - Replaced with LMA # 4 - better seal

## 2021-03-05 NOTE — Op Note (Signed)
Procedure: Cystoscopy with biopsy and fulguration of greater than 5 cm bladder lesion.  Preop diagnosis: Bladder lesion rule out CIS versus inflammation.  Postop diagnosis: Same.  Surgeon: Dr. Irine Seal.  Anesthesia: General.  Specimen: Biopsies from the dome and left lateral wall of the bladder.  Drains: None.  EBL: None.  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  He was found on recent surveillance cystoscopy to have residual erythema of the dome and left lateral wall of the bladder and has had persistent dysuria that has not improved significantly with Levaquin.  He has had a negative cytology on 01/03/2021.  It was felt that biopsy and fulguration were indicated to rule out recurrent/persistent carcinoma in situ versus post BCG inflammation.  Procedure: He was taken operating room where he was given Cipro 400 mg IV.  A general anesthetic was induced.  He was placed in lithotomy position and fitted with PAS hose.  His perineum and genitalia were prepped with Betadine solution he was draped in usual sterile fashion.  Cystoscopy was performed using a 21 [place] scope and 30 degree lens.  Examination revealed a normal urethra.  The external sphincter was intact.  The prostatic urethra short without obstruction but there was a small stone right at the opening of the verumontanum which eventually dislodged and was evacuated.  The bladder wall was smooth as but on the dome and left lateral wall there were erythematous velvety areas that are concerning for carcinoma in situ versus inflammatory change.  These lesions bled quite easily with distention of the bladder.  No papillary lesions were identified.  Ureteral orifices were unremarkable.  A cup biopsy forceps was used to do 2 biopsies from the lesion on the dome and then a Bugbee  electrode was used to attempt fulguration of the biopsy sites and surrounding lesions however because of ongoing bleeding and the extent of the lesion it was felt to transition to the resectoscope was indicated.  The irrigant was changed from water to saline.  The urethra was calibrated to 30 [place] with [employer] sounds.  59 French continuous-flow resectoscope sheath was inserted without difficulty with the aid of visual obturator.  Resectoscope was then fitted with an Beatrix Fetters handle with a bipolar loop and 30 degree lens.  The biopsy sites on the dome and the surrounding abnormal mucosa were fulgurated and then to TUR biopsies of the erythematous area on the left lateral wall were obtained.  The area was then generously fulgurated as well.  Once fulguration was complete there was a contiguous area approximately 3 x 6 cm in size involving the dome and left lateral wall.  On the left lateral wall the inferior margin was adjacent to the prior resection scar.  There was also a small area on the right lateral wall that had some bleeding from distention and it was fulgurated.  Once hemostasis is achieved, final inspection revealed no bladder wall perforation and no retained tissue or clots.  The bladder was partially drained and the resectoscope was removed.  It was not felt that a cath was indicated.  He was taken down from lithotomy position, his anesthetic was reversed and he was moved recovery in stable condition.  There were no complications.

## 2021-03-05 NOTE — Anesthesia Postprocedure Evaluation (Signed)
Anesthesia Post Note  Patient: Isaac Guerrero  Procedure(s) Performed: CYSTOSCOPY WITH BIOPSY FULGURATION (Bladder)     Patient location during evaluation: PACU Anesthesia Type: General Level of consciousness: awake and alert Pain management: pain level controlled Vital Signs Assessment: post-procedure vital signs reviewed and stable Respiratory status: spontaneous breathing, nonlabored ventilation, respiratory function stable and patient connected to nasal cannula oxygen Cardiovascular status: blood pressure returned to baseline and stable Postop Assessment: no apparent nausea or vomiting Anesthetic complications: no   No notable events documented.  Last Vitals:  Vitals:   03/05/21 0845 03/05/21 0900  BP: (!) 158/96 (!) 170/94  Pulse: 75 79  Resp: 10 10  Temp:    SpO2: 93% 95%    Last Pain:  Vitals:   03/05/21 0845  TempSrc:   PainSc: 0-No pain                 Barnet Glasgow

## 2021-03-05 NOTE — Interval H&P Note (Signed)
History and Physical Interval Note:  03/05/2021 7:21 AM  Isaac Guerrero  has presented today for surgery, with the diagnosis of bladder lesion.  The various methods of treatment have been discussed with the patient and family. After consideration of risks, benefits and other options for treatment, the patient has consented to  Procedure(s): CYSTOSCOPY WITH BIOPSY FULGURATION (N/A) as a surgical intervention.  The patient's history has been reviewed, patient examined, no change in status, stable for surgery.  I have reviewed the patient's chart and labs.  Questions were answered to the patient's satisfaction.     Irine Seal

## 2021-03-06 ENCOUNTER — Encounter (HOSPITAL_BASED_OUTPATIENT_CLINIC_OR_DEPARTMENT_OTHER): Payer: Self-pay | Admitting: Urology

## 2021-03-06 LAB — SURGICAL PATHOLOGY

## 2021-03-11 ENCOUNTER — Other Ambulatory Visit: Payer: Self-pay

## 2021-03-11 DIAGNOSIS — D090 Carcinoma in situ of bladder: Secondary | ICD-10-CM

## 2021-03-11 NOTE — Interval H&P Note (Signed)
History and Physical Interval Note:  BP (!) 156/88   Pulse 67   Temp (!) 97.5 F (36.4 C) (Oral)   Resp 16   Ht 5\' 10"  (1.778 m)   Wt 80.2 kg   SpO2 94%   BMI 25.38 kg/m  Gen WD, WN in NAD Lungs CTA CV RRR  03/11/2021 7:44 AM  Tina Griffiths  has presented today for surgery, with the diagnosis of bladder lesion.  The various methods of treatment have been discussed with the patient and family. After consideration of risks, benefits and other options for treatment, the patient has consented to  Procedure(s): CYSTOSCOPY WITH BIOPSY FULGURATION (N/A) as a surgical intervention.  The patient's history has been reviewed, patient examined, no change in status, stable for surgery.  I have reviewed the patient's chart and labs.  Questions were answered to the patient's satisfaction.     Irine Seal

## 2021-03-14 ENCOUNTER — Encounter: Payer: Self-pay | Admitting: Urology

## 2021-03-14 ENCOUNTER — Ambulatory Visit: Payer: Medicare Other | Admitting: Urology

## 2021-03-14 ENCOUNTER — Other Ambulatory Visit: Payer: Self-pay

## 2021-03-14 VITALS — BP 124/74 | HR 80

## 2021-03-14 DIAGNOSIS — R300 Dysuria: Secondary | ICD-10-CM | POA: Diagnosis not present

## 2021-03-14 DIAGNOSIS — Z8551 Personal history of malignant neoplasm of bladder: Secondary | ICD-10-CM

## 2021-03-14 LAB — URINALYSIS, ROUTINE W REFLEX MICROSCOPIC
Bilirubin, UA: POSITIVE — AB
Nitrite, UA: POSITIVE — AB
Specific Gravity, UA: 1.025 (ref 1.005–1.030)
Urobilinogen, Ur: 2 mg/dL — ABNORMAL HIGH (ref 0.2–1.0)
pH, UA: 5.5 (ref 5.0–7.5)

## 2021-03-14 LAB — MICROSCOPIC EXAMINATION
RBC, Urine: >30 /hpf — AB (ref 0–2)
Renal Epithel, UA: NONE SEEN /hpf
WBC, UA: >30 /hpf — AB (ref 0–5)

## 2021-03-14 NOTE — Progress Notes (Signed)
Subjective:  1. Burning with urination   2. History of bladder cancer     03/14/21: Isaac Guerrero returns today in f/u from his recent cystoscopy with bladder biopsy on 03/05/21.   The biopsies showed no CIS and only mild hyperemia and inflammation.  No granulomatous changes were noted.  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  He continues to have dysuria and is back on Azo with some relief.   He has no further hematuria.    02/14/21: Isaac Guerrero returns today for repeat cystoscopy.  He had bladder wall erythema following a second induction course of BCG at his last visit on 01/03/21.   A cytology was negative at that visit.  His UA today has 3-10 RBC's.   He continues to have frequency and dysuria despite a course of Levaquin.   01/03/21: Isaac Guerrero returns today in f/u.  He was given Levaquin 500mg  x 7 days in mid April and the burning is improving.  HIs UA today has 3-10 RBC's today.  He continues to have frequency and nocturia that are improving.  He was up 3 x last night.  His last BCG of the second induction course for his persistent CIS was in February.    11/15/20: Isaac Guerrero returns today f/u for his history of CIS.   He had his last BCG about a month ago and still has dysuria, frequency and urgency with nocturia x 5.  He isn't sure he empties.  He has a good stream.  His UA today has 3-10 RBC's.   09/06/20: Isaac Guerrero returns today in f/u from his recent cystoscopy with biopsy and fulguration on 08/09/20.  He was found to have recurrent/persistent CIS in 4/4 biopsy sites on the dome, base and posterior wall of the bladder.  He is having no hematuria or dysuria.  His IPSS is 6.    07/20/20: Isaac Guerrero returns today in f/u for his history of CIS found on TURBT from a left lateral wall lesion on 02/07/20 that was followed by BCG induction treatment that he completed on 04/16/20 in the [place] office. Isaac KitchenHe is voiding with reduced LUTS with an IPSS of 9.  He has had no hematuria.    01/16/20: Isaac Guerrero returns today in f/u. His cytology is positive. A CT today showed bilateral renal cysts and prostate enlargement but no bladder lesions or upper tract lesions. His IPSS remains elevated at 24.   01/04/20: Isaac Guerrero returns today in f/u for his LUTS with OAB. He was given a trial of Vesicare on 12/01/19 but didin't tolerate it or notice improvement . His IPSS is 25 with frequency and a sensation of incomplete emptying as primary issues. he has nocturia x 4 and urgency. He has some chronic dysuria and Azo doesn't help. He had no gross hematuria but he has 3-10 RBC's today.   12/01/2019: Seen in the past for ED and balanitis by Dr Jeffie Pollock. He is on Androgel for hypogonadism managed by his primary care physician. Presents today complaining of worsening frequency/urgency and nocturia that has persistently increased since the fall of last year. His PCP has previously trialed him on tamsulosin, Toviaz, and Myrbetriq with patient reporting no notable difference is in voiding symptoms with either of the medications. Not associated with any hesitancy or straining. He tells me tries to drink a lot of water but that seems to only make symptoms worse. Also associated with chronic intermittent burning with urination. He does not have any associated lower back  or flank pain/discomfort. He denies any visible blood in the urine. Not associated with fevers or chills, nausea/vomiting.       ROS:  ROS:  A complete review of systems was performed.  All systems are negative except for pertinent findings as noted.   ROS  Allergies  Allergen Reactions   Cephalexin Rash   Penicillins Rash   Sulfonamide Derivatives Rash    Outpatient Encounter Medications as of 03/14/2021  Medication Sig   b complex vitamins tablet Take 1 tablet by mouth daily.   Cholecalciferol (VITAMIN D3) 2000 UNITS TABS Take 2,000 Units by mouth daily.   Coenzyme Q10 200 MG capsule Take 200 mg by mouth daily.   gabapentin (NEURONTIN) 100  MG capsule Take 100 mg by mouth at bedtime as needed.   levothyroxine (SYNTHROID) 88 MCG tablet Take 88 mcg by mouth daily before breakfast.   loratadine (CLARITIN) 10 MG tablet Take 10 mg by mouth daily.   losartan (COZAAR) 50 MG tablet Take 50 mg by mouth daily.   meloxicam (MOBIC) 15 MG tablet Take 15 mg by mouth daily as needed for pain.    Omega-3 1400 MG CAPS    pantoprazole (PROTONIX) 40 MG tablet Take 40 mg by mouth daily.   traMADol (ULTRAM) 50 MG tablet Take 50 mg by mouth every 6 (six) hours as needed for moderate pain.    Turmeric Curcumin 500 MG CAPS    ZOCOR 40 MG tablet TAKE ONE TABLET BY MOUTH IN THE EVENING. (Patient taking differently: Take 40 mg by mouth every evening.)   No facility-administered encounter medications on file as of 03/14/2021.    Past Medical History:  Diagnosis Date   Anemia    Arthritis    oa   BPH (benign prostatic hyperplasia) 08/1989   GERD (gastroesophageal reflux disease) 11/29/1998   Hyperlipidemia 08/1997   Hypertension    Hypothyroidism 1983   Lesion of bladder    Lesion of bladder 02/28/2021   Pre-diabetes    Pulmonary nodules    Spermatocele    per Dr. Jeffie Pollock with URO   Urinary frequency 02/28/2021   and buring with urination    Past Surgical History:  Procedure Laterality Date   CATARACT EXTRACTION     left eye 03-2003 and right fall 2021   COLONOSCOPY  08/10/2009   polyp in ascending colon otherwise normal   COLONOSCOPY WITH ESOPHAGOGASTRODUODENOSCOPY (EGD) N/A 10/28/2013   Dr. Rourk:Schatzki's ring - not manipulated because no dysphagia currently. Small hiatal hernia.  Gastric and duodenal erosions of uncertain significance-status post biopsy. path with benign findings, no villous atrophy, no H.pylori/TCS:Colonic diverticulosis   CYSTOSCOPY  08/1989   normal   CYSTOSCOPY WITH BIOPSY N/A 02/07/2020   Procedure: CYSTOSCOPY WITH BLADDER AND PROSTATIS URETHRAL  BIOPSY WIHT FULGURATION AND INSTILL GEMCITABINE;  Surgeon: Irine Seal, MD;  Location: [facility];  Service: Urology;  Laterality: N/A;   CYSTOSCOPY WITH BIOPSY Bilateral 08/09/2020   Procedure: CYSTOSCOPY WITH BLADDER BIOPSYAND FULGURATION BILATERAL RETROGRADE S;  Surgeon: Irine Seal, MD;  Location: [facility];  Service: Urology;  Laterality: Bilateral;   CYSTOSCOPY WITH BIOPSY N/A 03/05/2021   Procedure: CYSTOSCOPY WITH BIOPSY FULGURATION;  Surgeon: Irine Seal, MD;  Location: [facility];  Service: Urology;  Laterality: N/A;   ESOPHAGOGASTRODUODENOSCOPY  10/1999   distal esoph. stricture; sliding H. H.   LASIK  04/12/2003   right eye   lumbar microdisectomy  2003   Dr. Hal Neer    Social History  Socioeconomic History   Marital status: Married    Spouse name: Not on file   Number of children: 1   Years of education: Not on file   Highest education level: Not on file  Occupational History   Occupation: Music therapist: [employer]    Comment: Music therapist  Tobacco Use   Smoking status: Never   Smokeless tobacco: Never  Vaping Use   Vaping Use: Never used  Substance and Sexual Activity   Alcohol use: No   Drug use: No   Sexual activity: Yes  Other Topics Concern   Not on file  Social History Narrative   Married and lives with wife   Social Determinants of Health   Financial Resource Strain: Not on file  Food Insecurity: Not on file  Transportation Needs: Not on file  Physical Activity: Not on file  Stress: Not on file  Social Connections: Not on file  Intimate Partner Violence: Not on file    Family History  Problem Relation Age of Onset   Hypertension Mother    Hyperlipidemia Mother    Diabetes Mother 28   Heart disease Father        CABG X 4  12/2001   Hyperlipidemia Father    Hypertension Father    Heart disease Brother        CABG X 1   Heart disease Paternal Uncle        MI   Cancer Maternal Grandfather        colon   Diabetes Maternal Uncle    Arthritis Maternal  Uncle    Stroke Paternal Grandfather    Allergic rhinitis Neg Hx    Angioedema Neg Hx    Asthma Neg Hx    Atopy Neg Hx    Eczema Neg Hx    Immunodeficiency Neg Hx    Urticaria Neg Hx        Objective: Vitals:   03/14/21 0851  BP: 124/74  Pulse: 80     Physical Exam  Lab Results:  PSA PSA  Date Value Ref Range Status  08/05/2011 0.98 0.10 - 4.00 ng/mL Final  07/22/2010 0.85 0.10 - 4.00 ng/mL Final  03/26/2009 1.03 0.10 - 4.00 ng/mL Final   No results found for: TESTOSTERONE    Studies/Results: Results for orders placed or performed in visit on 03/14/21 (from the past 24 hour(s))  Urinalysis, Routine w reflex microscopic     Status: Abnormal   Collection Time: 03/14/21  9:10 AM  Result Value Ref Range   Specific Gravity, UA 1.025 1.005 - 1.030   pH, UA 5.5 5.0 - 7.5   Color, UA Brown (A) Yellow   Appearance Ur Cloudy (A) Clear   Leukocytes,UA 3+ (A) Negative   Protein,UA 3+ (A) Negative/Trace   Glucose, UA Trace (A) Negative   Ketones, UA 1+ (A) Negative   RBC, UA 3+ (A) Negative   Bilirubin, UA Positive (A) Negative   Urobilinogen, Ur 2.0 (H) 0.2 - 1.0 mg/dL   Nitrite, UA Positive (A) Negative   Microscopic Examination See below:    Narrative   Performed at:  [facility] [address] [place], [place]  [id number] Lab Director: Mina Marble MT, Phone:  [phone]  Microscopic Examination     Status: Abnormal   Collection Time: 03/14/21  9:10 AM   Urine  Result Value Ref Range   WBC, UA >30 (A) 0 - 5 /hpf   RBC >30 (A) 0 -  2 /hpf   Epithelial Cells (non renal) 0-10 0 - 10 /hpf   Renal Epithel, UA None seen None seen /hpf   Bacteria, UA Many (A) None seen/Few   Narrative   Performed at:  [facility] [address], [place], [place]  [id number] Lab Director: Pinson, Phone:  [phone]       Assessment & Plan: Hx of  CIS s/p BCG induction x 2.   The biopsies show no recurrent CIS.   He continues to  have irritative symptoms and is back on Azo.   I will culture the urine today and also get him set up to see ID for antitubercular therapy.  He will return in 3 months for cystoscopy.  I don't think further BCG would be appropriate.  No orders of the defined types were placed in this encounter.     Orders Placed This Encounter  Procedures   Urine Culture   Microscopic Examination   Urinalysis, Routine w reflex microscopic      Return in about 3 months (around 06/14/2021) for cystoscopy on return.   CC: Celene Squibb, MD      Irine Seal 03/14/2021 Patient ID: Isaac Guerrero, male   DOB: [birth year], 65 y.o.   MRN: [id number]

## 2021-03-14 NOTE — Progress Notes (Signed)
Urological Symptom Review  Patient is experiencing the following symptoms: Frequent urination Hard to postpone urination Burning/pain with urination Get up at night to urinate Leakage of urine Stream starts and stops Erection problems (male only)   Review of Systems  Gastrointestinal (upper)  : Negative for upper GI symptoms  Gastrointestinal (lower) : Negative for lower GI symptoms  Constitutional : Negative for symptoms  Skin: Negative for skin symptoms  Eyes: Negative for eye symptoms  Ear/Nose/Throat : Negative for Ear/Nose/Throat symptoms  Hematologic/Lymphatic: Negative for Hematologic/Lymphatic symptoms  Cardiovascular : Negative for cardiovascular symptoms  Respiratory : Negative for respiratory symptoms  Endocrine: Negative for endocrine symptoms  Musculoskeletal: Negative for musculoskeletal symptoms  Neurological: Negative for neurological symptoms  Psychologic: Negative for psychiatric symptoms

## 2021-03-17 LAB — URINE CULTURE

## 2021-03-18 ENCOUNTER — Other Ambulatory Visit: Payer: Self-pay

## 2021-03-18 ENCOUNTER — Encounter: Payer: Self-pay | Admitting: Internal Medicine

## 2021-03-18 ENCOUNTER — Ambulatory Visit (INDEPENDENT_AMBULATORY_CARE_PROVIDER_SITE_OTHER): Payer: Medicare Other | Admitting: Internal Medicine

## 2021-03-18 ENCOUNTER — Other Ambulatory Visit: Payer: Self-pay | Admitting: Internal Medicine

## 2021-03-18 ENCOUNTER — Telehealth: Payer: Self-pay

## 2021-03-18 VITALS — BP 159/91 | HR 76 | Temp 97.3°F | Wt 179.0 lb

## 2021-03-18 DIAGNOSIS — C679 Malignant neoplasm of bladder, unspecified: Secondary | ICD-10-CM | POA: Diagnosis not present

## 2021-03-18 DIAGNOSIS — N3080 Other cystitis without hematuria: Secondary | ICD-10-CM

## 2021-03-18 DIAGNOSIS — T50A95A Adverse effect of other bacterial vaccines, initial encounter: Secondary | ICD-10-CM

## 2021-03-18 DIAGNOSIS — Z22322 Carrier or suspected carrier of Methicillin resistant Staphylococcus aureus: Secondary | ICD-10-CM

## 2021-03-18 MED ORDER — LINEZOLID 600 MG PO TABS: 600.0000 mg | ORAL_TABLET | Freq: Two times a day (BID) | ORAL | 0 refills | Status: DC

## 2021-03-18 MED ORDER — NITROFURANTOIN MONOHYD MACRO 100 MG PO CAPS: 100.0000 mg | ORAL_CAPSULE | Freq: Two times a day (BID) | ORAL | 0 refills | Status: DC

## 2021-03-18 NOTE — Progress Notes (Signed)
[name]  Reason for Consult:  BCG Cystitis  Referring Provider: Dr Nevada Crane   HPI:    Isaac Guerrero is a 65 y.o. male with PMHx as below who presents to the clinic for BCG cystitis.   Patient has a history of carcinoma in situ of the bladder.  He received BCG treatment with his last course being in February 2022.  He had a recent cystoscopy with bladder biopsy on 03/05/2021 the biopsies showed no CIS and only mild hyperemia and inflammation.  No granulomatous changes were noted.  He was referred to our clinic to consider antituberculous therapy for BCG cystitis as he continues to experience frequent urination and dysuria.  He is not having hematuria which he previously had.  He was most recently seen by his urologist on 03/14/2021 and was referred to our clinic for the consideration of BCG cystitis.  He had a urine culture done at his most recent visit which showed greater than 100,000 colonies of MRSA (sensitive to tetracyclines, TMP-SMX, and linezolid).  Lab work on 03/05/2021 was notable for BUN 21, creatinine 1.1, normal electrolytes.  Today, he reports that he has intermittent dysuria but no fevers, chill, or abdominal pain.  He does report frequency.  He has not been treated for his recent MRSA positive urine culture.  He is allergic to Bactrim.  Patient's Medications  New Prescriptions   LINEZOLID (ZYVOX) 600 MG TABLET    Take 1 tablet (600 mg total) by mouth 2 (two) times daily for 7 days.  Previous Medications   B COMPLEX VITAMINS TABLET    Take 1 tablet by mouth daily.   CHOLECALCIFEROL (VITAMIN D3) 2000 UNITS TABS    Take 2,000 Units by mouth daily.   COENZYME Q10 200 MG CAPSULE    Take 200 mg by mouth daily.   GABAPENTIN (NEURONTIN) 100 MG CAPSULE    Take 100 mg by mouth at bedtime as needed.   LEVOTHYROXINE (SYNTHROID) 88 MCG TABLET    Take 88 mcg by mouth daily before breakfast.   LORATADINE (CLARITIN) 10 MG TABLET    Take 10 mg by mouth daily.    LOSARTAN (COZAAR) 50 MG TABLET    Take 50 mg by mouth daily.   MELOXICAM (MOBIC) 15 MG TABLET    Take 15 mg by mouth daily as needed for pain.    OMEGA-3 1400 MG CAPS       PANTOPRAZOLE (PROTONIX) 40 MG TABLET    Take 40 mg by mouth daily.   TRAMADOL (ULTRAM) 50 MG TABLET    Take 50 mg by mouth every 6 (six) hours as needed for moderate pain.    TURMERIC CURCUMIN 500 MG CAPS       ZOCOR 40 MG TABLET    TAKE ONE TABLET BY MOUTH IN THE EVENING.  Modified Medications   No medications on file  Discontinued Medications   No medications on file      Past Medical History:  Diagnosis Date   Anemia    Arthritis    oa   BPH (benign prostatic hyperplasia) 08/1989   GERD (gastroesophageal reflux disease) 11/29/1998   Hyperlipidemia 08/1997   Hypertension    Hypothyroidism 1983   Lesion of bladder    Lesion of bladder 02/28/2021   Pre-diabetes    Pulmonary nodules    Spermatocele    per Dr. Jeffie Pollock with URO   Urinary frequency 02/28/2021   and buring with urination  Social History   Tobacco Use   Smoking status: Never   Smokeless tobacco: Never  Vaping Use   Vaping Use: Never used  Substance Use Topics   Alcohol use: No   Drug use: No    Family History  Problem Relation Age of Onset   Hypertension Mother    Hyperlipidemia Mother    Diabetes Mother 44   Heart disease Father        CABG X 4  12/2001   Hyperlipidemia Father    Hypertension Father    Heart disease Brother        CABG X 1   Heart disease Paternal Uncle        MI   Cancer Maternal Grandfather        colon   Diabetes Maternal Uncle    Arthritis Maternal Uncle    Stroke Paternal Grandfather    Allergic rhinitis Neg Hx    Angioedema Neg Hx    Asthma Neg Hx    Atopy Neg Hx    Eczema Neg Hx    Immunodeficiency Neg Hx    Urticaria Neg Hx     Allergies  Allergen Reactions   Cephalexin Rash   Penicillins Rash   Sulfonamide Derivatives Rash    Review of Systems  Constitutional:  Negative for chills  and fever.  Genitourinary:  Positive for dysuria and frequency. Negative for hematuria.  All other systems reviewed and are negative.    OBJECTIVE:    Vitals:   03/18/21 0856  BP: (!) 159/91  Pulse: 76  Temp: (!) 97.3 F (36.3 C)  TempSrc: Oral  Weight: 179 lb (81.2 kg)     Body mass index is 25.68 kg/m.  Physical Exam Constitutional:      General: He is not in acute distress.    Appearance: Normal appearance. He is normal weight.  HENT:     Head: Normocephalic and atraumatic.  Eyes:     Extraocular Movements: Extraocular movements intact.     Conjunctiva/sclera: Conjunctivae normal.  Pulmonary:     Effort: Pulmonary effort is normal. No respiratory distress.  Abdominal:     Palpations: Abdomen is soft.     Tenderness: There is no abdominal tenderness.  Skin:    General: Skin is warm and dry.     Findings: No rash.  Neurological:     General: No focal deficit present.     Mental Status: He is alert and oriented to person, place, and time.     Labs and Microbiology:  CBC Latest Ref Rng & Units 03/05/2021 08/06/2020 02/07/2020  WBC 4.0 - 10.5 K/uL - 5.0 -  Hemoglobin 13.0 - 17.0 g/dL 14.6 16.6 17.3(H)  Hematocrit 39.0 - 52.0 % 43.0 48.1 51.0  Platelets 150 - 400 K/uL - 173 -   CMP Latest Ref Rng & Units 03/05/2021 10/25/2020 08/06/2020  Glucose 70 - 99 mg/dL 137(H) 108(H) 115(H)  BUN 8 - 23 mg/dL 21 22 24(H)  Creatinine 0.61 - 1.24 mg/dL 1.10 1.25(H) 1.29(H)  Sodium 135 - 145 mmol/L 140 139 136  Potassium 3.5 - 5.1 mmol/L 3.8 4.4 5.3(H)  Chloride 98 - 111 mmol/L 102 103 99  CO2 22 - 32 mmol/L - 28 25  Calcium 8.9 - 10.3 mg/dL - 9.9 9.9  Total Protein 6.5 - 8.1 g/dL - 7.5 -  Total Bilirubin 0.3 - 1.2 mg/dL - 1.4(H) -  Alkaline Phos 38 - 126 U/L - 71 -  AST 15 - 41 U/L -  24 -  ALT 0 - 44 U/L - 26 -     Recent Results (from the past 240 hour(s))  Microscopic Examination     Status: Abnormal   Collection Time: 03/14/21  9:10 AM   Urine  Result Value Ref Range  Status   WBC, UA >30 (A) 0 - 5 /hpf Final   RBC >30 (A) 0 - 2 /hpf Final   Epithelial Cells (non renal) 0-10 0 - 10 /hpf Final   Renal Epithel, UA None seen None seen /hpf Final   Bacteria, UA Many (A) None seen/Few Final  Urine Culture     Status: Abnormal   Collection Time: 03/14/21 12:03 PM   Specimen: Urine, Clean Catch   UR  Result Value Ref Range Status   Urine Culture, Routine Final report (A)  Final   Organism ID, Bacteria Staphylococcus aureus (A)  Final    Comment: Based on resistance to oxacillin this isolate would be resistant to all currently available beta-lactam antimicrobial agents, with the exception of the newer cephalosporins with anti-MRSA activity, such as Ceftaroline Greater than 100,000 colony forming units per mL    Antimicrobial Susceptibility Comment  Final    Comment:       ** S = Susceptible; I = Intermediate; R = Resistant **                    P = Positive; N = Negative             MICS are expressed in micrograms per mL    Antibiotic                 RSLT#1    RSLT#2    RSLT#3    RSLT#4 Ciprofloxacin                  R Gentamicin                     S Levofloxacin                   R Linezolid                      S Nitrofurantoin                 S Oxacillin                      R Penicillin                     R Rifampin                       S Tetracycline                   S Trimethoprim/Sulfa             S Vancomycin                     S      ASSESSMENT & PLAN:    1. Malignant neoplasm of urinary bladder  2. BCG cystitis, suspected  3. MRSA (methicillin resistant staph aureus) culture positive  Will send AFB urine cultures to see if M bovis can be isolated.  Discussed treatment of BCG cystitis would be 2 months of triple antibiotics with INH, ethambutol, and rifampin followed by 7 months of INH and rifampin.  He does not have symptoms to suggest systemic infection at this time.  Will also check blood cultures given presence of MRSA in  urine although suspect may be secondary to recent urologic procedure.    Initially, we had prescribed linezolid for his MRSA UTI in the setting of TMP SMX allergy.  However, after further discussion with our ID pharmacist there is concern for linezolid presence in the urine.  Same concern exists for doxycycline.  Therefore, will cancel linezolid prescription and change to nitrofurantoin 100mg  BID.   I called patient to discuss the change directly and apologized for my change in plans as he had already picked up the linezolid prescription.  He states he will go to pharmacy and pick up the Nitrofurantoin.   RTC 4 weeks, sooner if needed.    Orders Placed This Encounter  Procedures   Blood culture (routine single)   Blood culture (routine single)   MYCOBACTERIA, CULTURE, WITH FLUOROCHROME SMEAR   Basic metabolic panel    Order Specific Question:   Has the patient fasted?    Answer:   No   CBC      Raynelle High[facility] 03/18/2021, 9:09 AM

## 2021-03-18 NOTE — Patient Instructions (Signed)
Thank you for coming to see me today. It was a pleasure seeing you.  To Do: I have prescribed Linezolid 600mg  twice daily for 7 days to treat MRSA in the urine Labs and urine specimen today Please do not take your Tramadol with Linezolid for the next 7 days Follow up in 4 weeks  If you have any questions or concerns, please do not hesitate to call the office at [phone].  Take Care,   Jule Ser, DO

## 2021-03-18 NOTE — Telephone Encounter (Signed)
-----   Message from Irine Seal, MD sent at 03/18/2021  9:54 AM EDT ----- Macrobid 100mg  po bid #14 ----- Message ----- From: Dorisann Frames, RN Sent: 03/18/2021   9:50 AM EDT To: Irine Seal, MD  Please review

## 2021-03-18 NOTE — Telephone Encounter (Addendum)
Prescription sent. Called patient. No answer. Left message to call if any questions and prescription sent to pharmacy.

## 2021-03-20 ENCOUNTER — Telehealth: Payer: Self-pay

## 2021-03-20 NOTE — Telephone Encounter (Signed)
Spoke with patient, advised him per Dr. Juleen China that his routine blood work was stable and that his blood cultures are currently negative and that we will update him if anything turns positive. Notified him that AFB urine culture is still pending and that cultures can be held for 6 weeks but that we will update him if anything changes before his follow up. Patient verbalized understanding and has no further questions.   Beryle Flock, RN

## 2021-03-20 NOTE — Telephone Encounter (Signed)
-----   Message from Mignon Pine, DO sent at 03/20/2021  8:48 AM EDT ----- Please let patient know that routine blood work was stable.  Blood cultures are negative to date but held for 5 days and will update him if anything turns positive.  His urine for AFB cultures to evaluate for BCG cystitis is pending as well but these cultures are held for up to 6 weeks.  Will update him if they turn positive prior to his follow up with me next month.

## 2021-03-24 LAB — CULTURE, BLOOD (SINGLE)
MICRO NUMBER:: 12131715
Result:: NO GROWTH
SPECIMEN QUALITY:: ADEQUATE

## 2021-03-31 DIAGNOSIS — E7800 Pure hypercholesterolemia, unspecified: Secondary | ICD-10-CM | POA: Diagnosis not present

## 2021-03-31 DIAGNOSIS — I10 Essential (primary) hypertension: Secondary | ICD-10-CM | POA: Diagnosis not present

## 2021-04-24 DIAGNOSIS — I10 Essential (primary) hypertension: Secondary | ICD-10-CM | POA: Diagnosis not present

## 2021-04-24 DIAGNOSIS — E1165 Type 2 diabetes mellitus with hyperglycemia: Secondary | ICD-10-CM | POA: Diagnosis not present

## 2021-04-29 DIAGNOSIS — K219 Gastro-esophageal reflux disease without esophagitis: Secondary | ICD-10-CM | POA: Diagnosis not present

## 2021-04-29 DIAGNOSIS — K7689 Other specified diseases of liver: Secondary | ICD-10-CM | POA: Diagnosis not present

## 2021-04-29 DIAGNOSIS — J309 Allergic rhinitis, unspecified: Secondary | ICD-10-CM | POA: Diagnosis not present

## 2021-04-29 DIAGNOSIS — N182 Chronic kidney disease, stage 2 (mild): Secondary | ICD-10-CM | POA: Diagnosis not present

## 2021-04-30 ENCOUNTER — Ambulatory Visit: Payer: Medicare Other | Admitting: Internal Medicine

## 2021-04-30 ENCOUNTER — Encounter: Payer: Self-pay | Admitting: Internal Medicine

## 2021-04-30 ENCOUNTER — Other Ambulatory Visit: Payer: Self-pay

## 2021-04-30 VITALS — BP 169/68 | HR 70 | Temp 98.5°F | Wt 185.0 lb

## 2021-04-30 DIAGNOSIS — Z22322 Carrier or suspected carrier of Methicillin resistant Staphylococcus aureus: Secondary | ICD-10-CM | POA: Diagnosis not present

## 2021-04-30 DIAGNOSIS — T50A95A Adverse effect of other bacterial vaccines, initial encounter: Secondary | ICD-10-CM | POA: Diagnosis not present

## 2021-04-30 DIAGNOSIS — N3080 Other cystitis without hematuria: Secondary | ICD-10-CM | POA: Diagnosis not present

## 2021-04-30 NOTE — Progress Notes (Signed)
[facility]  CHIEF COMPLAINT:    Follow up for BCG cystitis  SUBJECTIVE:    Isaac Guerrero is a 65 y.o. male with PMHx as below who presents to the clinic for BCG cystitis.   Isaac Guerrero presents for follow-up today with his wife following initial evaluation 03/18/2021.  After that visit he was treated for MRSA positive urine culture with nitrofurantoin.  We had also considered initially treating with linezolid or doxycycline, however, after further discussion with our ID pharmacist there was concern for lack of linezolid presence in the urine.  Thus, he was treated with nitrofurantoin with a positive response.  He has no symptoms today.  Of note, upon further review of available data linezolid probably would be adequate if needed again.  Patient also submitted AFB urine cultures to determine if M bovis could be isolated.  We discussed at that time treatment of BCG cystitis consisting of 2 months of triple antibiotics with INH, ethambutol, and rifampin followed by 7 months of INH and rifampin.  We also checked blood cultures in the setting of MRSA cystitis, however, suspect that this was due to recent urologic procedure.  His blood cultures were negative and his AFB urine smear was negative and AFB culture is currently negative, but being held for 6 weeks until determined to be final.  Please see A&P for the details of today's visit and status of the patient's medical problems.   Patient's Medications  New Prescriptions   No medications on file  Previous Medications   B COMPLEX VITAMINS TABLET    Take 1 tablet by mouth daily.   CHOLECALCIFEROL (VITAMIN D3) 2000 UNITS TABS    Take 2,000 Units by mouth daily.   COENZYME Q10 200 MG CAPSULE    Take 200 mg by mouth daily.   GABAPENTIN (NEURONTIN) 100 MG CAPSULE    Take 100 mg by mouth at bedtime as needed.   LEVOTHYROXINE (SYNTHROID) 88 MCG TABLET    Take 88 mcg by mouth daily before breakfast.   LORATADINE  (CLARITIN) 10 MG TABLET    Take 10 mg by mouth daily.   LOSARTAN (COZAAR) 50 MG TABLET    Take 50 mg by mouth daily.   MELOXICAM (MOBIC) 15 MG TABLET    Take 15 mg by mouth daily as needed for pain.    OMEGA-3 1400 MG CAPS       PANTOPRAZOLE (PROTONIX) 40 MG TABLET    Take 40 mg by mouth daily.   TRAMADOL (ULTRAM) 50 MG TABLET    Take 50 mg by mouth every 6 (six) hours as needed for moderate pain.    TURMERIC CURCUMIN 500 MG CAPS       ZOCOR 40 MG TABLET    TAKE ONE TABLET BY MOUTH IN THE EVENING.  Modified Medications   No medications on file  Discontinued Medications   NITROFURANTOIN, MACROCRYSTAL-MONOHYDRATE, (MACROBID) 100 MG CAPSULE    Take 1 capsule (100 mg total) by mouth every 12 (twelve) hours.      Past Medical History:  Diagnosis Date   Anemia    Arthritis    oa   BPH (benign prostatic hyperplasia) 08/1989   GERD (gastroesophageal reflux disease) 11/29/1998   Hyperlipidemia 08/1997   Hypertension    Hypothyroidism 1983   Lesion of bladder    Lesion of bladder 02/28/2021   Pre-diabetes    Pulmonary nodules    Spermatocele    per Dr. Jeffie Pollock  with URO   Urinary frequency 02/28/2021   and buring with urination    Social History   Tobacco Use   Smoking status: Never   Smokeless tobacco: Never  Vaping Use   Vaping Use: Never used  Substance Use Topics   Alcohol use: No   Drug use: No    Family History  Problem Relation Age of Onset   Hypertension Mother    Hyperlipidemia Mother    Diabetes Mother 11   Heart disease Father        CABG X 4  12/2001   Hyperlipidemia Father    Hypertension Father    Heart disease Brother        CABG X 1   Heart disease Paternal Uncle        MI   Cancer Maternal Grandfather        colon   Diabetes Maternal Uncle    Arthritis Maternal Uncle    Stroke Paternal Grandfather    Allergic rhinitis Neg Hx    Angioedema Neg Hx    Asthma Neg Hx    Atopy Neg Hx    Eczema Neg Hx    Immunodeficiency Neg Hx    Urticaria Neg Hx      Allergies  Allergen Reactions   Cephalexin Rash   Penicillins Rash   Sulfonamide Derivatives Rash    Review of Systems  Constitutional: Negative.   Genitourinary: Negative.   Skin: Negative.     OBJECTIVE:    Vitals:   04/30/21 0924  BP: (!) 169/68  Pulse: 70  Temp: 98.5 F (36.9 C)  TempSrc: Oral  SpO2: 95%  Weight: 185 lb (83.9 kg)   Body mass index is 26.54 kg/m.  Physical Exam Constitutional:      General: He is not in acute distress.    Appearance: Normal appearance.  HENT:     Head: Normocephalic and atraumatic.  Pulmonary:     Effort: Pulmonary effort is normal. No respiratory distress.  Skin:    General: Skin is warm and dry.     Findings: No rash.  Neurological:     General: No focal deficit present.     Mental Status: He is alert and oriented to person, place, and time.  Psychiatric:        Mood and Affect: Mood normal.        Behavior: Behavior normal.     Labs and Microbiology: CBC Latest Ref Rng & Units 03/18/2021 03/05/2021 08/06/2020  WBC 3.8 - 10.8 Thousand/uL 7.0 - 5.0  Hemoglobin 13.2 - 17.1 g/dL 14.7 14.6 16.6  Hematocrit 38.5 - 50.0 % 42.6 43.0 48.1  Platelets 140 - 400 Thousand/uL 181 - 173   CMP Latest Ref Rng & Units 03/18/2021 03/05/2021 10/25/2020  Glucose 65 - 99 mg/dL 123(H) 137(H) 108(H)  BUN 7 - 25 mg/dL 29(H) 21 22  Creatinine 0.70 - 1.35 mg/dL 1.16 1.10 1.25(H)  Sodium 135 - 146 mmol/L 139 140 139  Potassium 3.5 - 5.3 mmol/L 4.4 3.8 4.4  Chloride 98 - 110 mmol/L 104 102 103  CO2 20 - 32 mmol/L 25 - 28  Calcium 8.6 - 10.3 mg/dL 9.5 - 9.9  Total Protein 6.5 - 8.1 g/dL - - 7.5  Total Bilirubin 0.3 - 1.2 mg/dL - - 1.4(H)  Alkaline Phos 38 - 126 U/L - - 71  AST 15 - 41 U/L - - 24  ALT 0 - 44 U/L - - 26      ASSESSMENT &  PLAN:    1. BCG cystitis Patient with no current urinary symptoms and AFB urine cultures negative to date.  Will follow for final cultures and treat if they turn positive.  Will update patient when we  get final cultures back.  2. MRSA (methicillin resistant staph aureus) culture positive Treated with course of nitrofurantoin last month and no current symptoms.  Blood cultures were negative and suspect this was related to recent urologic procedure at the time.   RTC PRN.    Raynelle Highland for Infectious Disease [facility] 04/30/2021, 9:45 AM

## 2021-04-30 NOTE — Patient Instructions (Signed)
Thank you for coming to see me today. It was a pleasure seeing you.  To Do: I will update you when your AFB urine cultures results return For now, follow up as needed  If you have any questions or concerns, please do not hesitate to call the office at [phone].  Take Care,   Jule Ser

## 2021-05-02 LAB — CULTURE, BLOOD (SINGLE)
MICRO NUMBER:: 12131714
Result:: NO GROWTH
SPECIMEN QUALITY:: ADEQUATE

## 2021-05-02 LAB — BASIC METABOLIC PANEL
BUN/Creatinine Ratio: 25 (calc) — ABNORMAL HIGH (ref 6–22)
BUN: 29 mg/dL — ABNORMAL HIGH (ref 7–25)
CO2: 25 mmol/L (ref 20–32)
Calcium: 9.5 mg/dL (ref 8.6–10.3)
Chloride: 104 mmol/L (ref 98–110)
Creat: 1.16 mg/dL (ref 0.70–1.35)
Glucose, Bld: 123 mg/dL — ABNORMAL HIGH (ref 65–99)
Potassium: 4.4 mmol/L (ref 3.5–5.3)
Sodium: 139 mmol/L (ref 135–146)

## 2021-05-02 LAB — MYCOBACTERIA,CULT W/FLUOROCHROME SMEAR
MICRO NUMBER:: 12135500
SMEAR:: NONE SEEN
SPECIMEN QUALITY:: ADEQUATE

## 2021-05-02 LAB — CBC
HCT: 42.6 % (ref 38.5–50.0)
Hemoglobin: 14.7 g/dL (ref 13.2–17.1)
MCH: 30.2 pg (ref 27.0–33.0)
MCHC: 34.5 g/dL (ref 32.0–36.0)
MCV: 87.5 fL (ref 80.0–100.0)
MPV: 9.2 fL (ref 7.5–12.5)
Platelets: 181 10*3/uL (ref 140–400)
RBC: 4.87 10*6/uL (ref 4.20–5.80)
RDW: 12.7 % (ref 11.0–15.0)
WBC: 7 10*3/uL (ref 3.8–10.8)

## 2021-05-07 ENCOUNTER — Telehealth: Payer: Self-pay

## 2021-05-07 NOTE — Telephone Encounter (Signed)
-----   Message from Mignon Pine, DO sent at 05/07/2021 12:23 PM EDT ----- Can you please let pt know that his AFB urine cultures were finalized as negative at 6 weeks.  No further follow up needed from [place] for now.

## 2021-05-07 NOTE — Telephone Encounter (Signed)
Spoke with patient, relayed per Dr. Juleen China that his urine cultures were finalized as negative at 6 weeks and no need for further follow up from [place] at this time. Patient verbalized understanding and has no further questions.   Beryle Flock, RN

## 2021-05-23 ENCOUNTER — Ambulatory Visit (HOSPITAL_COMMUNITY)
Admission: RE | Admit: 2021-05-23 | Discharge: 2021-05-23 | Disposition: A | Discharge status: Home / Self Care | Payer: Medicare Other | Source: Ambulatory Visit | Attending: Internal Medicine | Admitting: Internal Medicine

## 2021-05-23 ENCOUNTER — Ambulatory Visit: Payer: Medicare Other | Admitting: Internal Medicine

## 2021-05-23 ENCOUNTER — Encounter: Payer: Self-pay | Admitting: Internal Medicine

## 2021-05-23 ENCOUNTER — Other Ambulatory Visit: Payer: Self-pay

## 2021-05-23 VITALS — BP 122/78 | HR 77 | Temp 98.2°F | Ht 70.0 in | Wt 182.1 lb

## 2021-05-23 DIAGNOSIS — R918 Other nonspecific abnormal finding of lung field: Secondary | ICD-10-CM | POA: Diagnosis not present

## 2021-05-23 DIAGNOSIS — D869 Sarcoidosis, unspecified: Secondary | ICD-10-CM | POA: Diagnosis not present

## 2021-05-23 DIAGNOSIS — R911 Solitary pulmonary nodule: Secondary | ICD-10-CM | POA: Diagnosis not present

## 2021-05-23 DIAGNOSIS — Z23 Encounter for immunization: Secondary | ICD-10-CM | POA: Diagnosis not present

## 2021-05-23 NOTE — Patient Instructions (Signed)
Please remember to go to the  x-ray department  @  [facility] for your tests - we will call you with the results when they are available      Please schedule a follow up visit in 12 months but call sooner if needed

## 2021-05-23 NOTE — Progress Notes (Signed)
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  04/10/2020 by Dr  [place] Crane.    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 No new co's.    Dyspnea:  Ex x treadmill x 1.5 mph on grade ? 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   Dyspnea:  81 m s stopping stair master  Cough: not  Sleeping: lie flat one pillow  SABA use: none  02: none  Covid status: 3 total shots /never had the infection    No obvious day to day or daytime variability or assoc excess/ purulent sputum or mucus plugs or hemoptysis or cp or chest tightness, subjective wheeze or overt sinus or hb symptoms.   Sleeping  without nocturnal  or early am exacerbation  of respiratory   c/o's or need for noct saba. Also denies any obvious fluctuation of symptoms with weather or environmental changes or other aggravating or alleviating factors except as outlined above   No unusual exposure hx or h/o childhood pna/ asthma or knowledge of premature birth.  Current Allergies, Complete Past Medical History, Past Surgical History, Family History, and Social History were reviewed in [employer] record.  ROS  The following are not active complaints unless bolded Hoarseness, sore throat, dysphagia, dental problems, itching, sneezing,  nasal congestion or discharge of excess mucus or purulent secretions, ear ache,   fever, chills, sweats, unintended wt loss or wt gain, classically pleuritic or exertional cp,  orthopnea pnd or arm/hand swelling  or leg swelling, presyncope, palpitations, abdominal pain, anorexia, nausea, vomiting, diarrhea  or change in bowel habits or change in bladder habits, change in stools or change in urine, dysuria, hematuria,  rash, arthralgias, visual complaints, headache, numbness, weakness or ataxia or problems with walking or coordination,  change in mood or  memory.        Current Meds  Medication Sig   b complex vitamins tablet Take 1 tablet by mouth daily.  Cholecalciferol (VITAMIN D3) 2000 UNITS TABS Take 2,000 Units by mouth daily.   Coenzyme Q10 200 MG capsule Take 200 mg by mouth daily.   gabapentin (NEURONTIN) 100 MG capsule Take 100 mg by mouth at bedtime as needed.   levothyroxine (SYNTHROID) 88 MCG tablet Take 88 mcg by mouth daily before breakfast.   loratadine (CLARITIN) 10 MG tablet Take 10 mg by mouth daily.   losartan (COZAAR) 50 MG tablet Take 50 mg by mouth daily.   meloxicam (MOBIC) 15 MG tablet Take 15 mg by mouth daily as needed for pain.    Omega-3 1400 MG CAPS    pantoprazole (PROTONIX) 40 MG tablet Take 40 mg by mouth daily.   traMADol (ULTRAM) 50 MG tablet Take 50 mg by mouth every 6 (six) hours as needed for  moderate pain.    Turmeric Curcumin 500 MG CAPS    ZOCOR 40 MG tablet TAKE ONE TABLET BY MOUTH IN THE EVENING.             Past Medical History:  Diagnosis Date   Arthritis    BPH (benign prostatic hyperplasia) 08/1989   GERD (gastroesophageal reflux disease) 11/29/1998   Hyperlipidemia 08/1997   Hypertension    Hypothyroidism 1983   Lesion of bladder    Spermatocele    per Dr. Jeffie Pollock with URO       Objective:    05/23/2021       182   10/25/20 189 lb 6.4 oz (85.9 kg)  09/06/20 188 lb (85.3 kg)  08/09/20 187 lb (84.8 kg)    Vital signs reviewed  05/23/2021  - Note at rest 02 sats  98% on RA   General appearance:    amb pleasant wm nad   HEENT : pt wearing mask not removed for exam due to covid -19 concerns.    NECK :  without JVD/Nodes/TM/ nl carotid upstrokes bilaterally   LUNGS: no acc muscle use,  Nl contour chest which is clear to A and P bilaterally without cough on insp or exp maneuvers   CV:  RRR  no s3 or murmur or increase in P2, and no edema   ABD:  soft and nontender with nl inspiratory excursion in the supine position. No bruits or organomegaly appreciated, bowel sounds nl  MS:  Nl gait/ ext warm without deformities, calf tenderness, cyanosis or clubbing No obvious joint restrictions   SKIN: warm and dry without lesions    NEURO:  alert, approp, nl sensorium with  no motor or cerebellar deficits apparent.    CXR PA and Lateral:   05/23/2021 :    I personally reviewed images and agree with radiology impression as follows:     Stable examination with unchanged pulmonary nodularity and nodular enlargement of the pulmonary hila  My review: really no change on plain xray going back to 2014 and still no viz micronodules                  Assessment

## 2021-05-24 ENCOUNTER — Encounter: Payer: Self-pay | Admitting: Internal Medicine

## 2021-05-24 NOTE — Assessment & Plan Note (Signed)
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  Total time for H and P, chart review, counseling, and generating customized AVS unique to this office visit / same day charting = 20 min

## 2021-06-13 ENCOUNTER — Encounter: Payer: Self-pay | Admitting: Urology

## 2021-06-13 ENCOUNTER — Ambulatory Visit (INDEPENDENT_AMBULATORY_CARE_PROVIDER_SITE_OTHER): Payer: Medicare Other | Admitting: Urology

## 2021-06-13 ENCOUNTER — Other Ambulatory Visit: Payer: Self-pay

## 2021-06-13 VITALS — BP 154/82 | HR 79 | Temp 98.0°F | Wt 181.0 lb

## 2021-06-13 DIAGNOSIS — R350 Frequency of micturition: Secondary | ICD-10-CM

## 2021-06-13 DIAGNOSIS — R3129 Other microscopic hematuria: Secondary | ICD-10-CM

## 2021-06-13 DIAGNOSIS — Z87440 Personal history of urinary (tract) infections: Secondary | ICD-10-CM | POA: Diagnosis not present

## 2021-06-13 DIAGNOSIS — Z8551 Personal history of malignant neoplasm of bladder: Secondary | ICD-10-CM

## 2021-06-13 LAB — URINALYSIS, ROUTINE W REFLEX MICROSCOPIC
Bilirubin, UA: NEGATIVE
Glucose, UA: NEGATIVE
Nitrite, UA: NEGATIVE
Specific Gravity, UA: 1.02 (ref 1.005–1.030)
Urobilinogen, Ur: 1 mg/dL (ref 0.2–1.0)
pH, UA: 6 (ref 5.0–7.5)

## 2021-06-13 LAB — MICROSCOPIC EXAMINATION
Bacteria, UA: NONE SEEN
Renal Epithel, UA: NONE SEEN /hpf

## 2021-06-13 MED ORDER — CIPROFLOXACIN HCL 500 MG PO TABS
500.0000 mg | ORAL_TABLET | Freq: Once | ORAL | Status: AC
Start: 2021-06-13 — End: 2021-06-13
  Administered 2021-06-13: 500 mg via ORAL

## 2021-06-13 NOTE — Progress Notes (Signed)
Subjective:  1. History of bladder cancer   2. Microhematuria   3. Personal history of urinary infection   4. Urinary frequency     06/13/21: Cevin returns today in f/u for his history of CIS for surveillance cystoscopy.  His last positive biopsy was in 12/21.   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  He was treated with nitrofurantoin for MRSA.  He has no hematuria or voiding symptoms at this time.   He has seen Dr. Melvyn Novas for concern about pulmonary nodules had a stable CXR on 05/23/21 with findings consistent with his history of sarcoidosis.  I have reviewed notes from ID and Pulmonary and the CXR report. .   03/14/21: Yoltzin returns today in f/u from his recent cystoscopy with bladder biopsy on 03/05/21.   The biopsies showed no CIS and only mild hyperemia and inflammation.  No granulomatous changes were noted.  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  He continues to have dysuria and is back on Azo with some relief.   He has no further hematuria.    02/14/21: Infant returns today for repeat cystoscopy.  He had bladder wall erythema following a second induction course of BCG at his last visit on 01/03/21.   A cytology was negative at that visit.  His UA today has 3-10 RBC's.   He continues to have frequency and dysuria despite a course of Levaquin.   01/03/21: Ames returns today in f/u.  He was given Levaquin 500mg  x 7 days in mid April and the burning is improving.  HIs UA today has 3-10 RBC's today.  He continues to have frequency and nocturia that are improving.  He was up 3 x last night.  His last BCG of the second induction course for his persistent CIS was in February.    11/15/20: Farren returns today f/u for his history of CIS.   He had his last BCG about a month ago and still has dysuria, frequency and urgency with nocturia x 5.  He isn't sure he empties.  He has a good stream.  His UA today has 3-10 RBC's.    09/06/20: Phoenix returns today in f/u from his recent cystoscopy with biopsy and fulguration on 08/09/20.  He was found to have recurrent/persistent CIS in 4/4 biopsy sites on the dome, base and posterior wall of the bladder.  He is having no hematuria or dysuria.  His IPSS is 6.    07/20/20: Cantrell returns today in f/u for his history of CIS found on TURBT from a left lateral wall lesion on 02/07/20 that was followed by BCG induction treatment that he completed on 04/16/20 in the [place] office. Marland KitchenHe is voiding with reduced LUTS with an IPSS of 9.  He has had no hematuria.   01/16/20: Cristino returns today in f/u. His cytology is positive. A CT today showed bilateral renal cysts and prostate enlargement but no bladder lesions or upper tract lesions. His IPSS remains elevated at 24.   01/04/20: Zakhai returns today in f/u for his LUTS with OAB. He was given a trial of Vesicare on 12/01/19 but didin't tolerate it or notice improvement . His IPSS is 25 with frequency and a sensation of incomplete emptying as primary issues. he has nocturia x 4 and urgency. He has some chronic dysuria and Azo doesn't help. He had no gross hematuria but he has 3-10 RBC's today.  12/01/2019: Seen in the past for ED and balanitis by Dr Jeffie Pollock. He is on Androgel for hypogonadism managed by his primary care physician. Presents today complaining of worsening frequency/urgency and nocturia that has persistently increased since the fall of last year. His PCP has previously trialed him on tamsulosin, Toviaz, and Myrbetriq with patient reporting no notable difference is in voiding symptoms with either of the medications. Not associated with any hesitancy or straining. He tells me tries to drink a lot of water but that seems to only make symptoms worse. Also associated with chronic intermittent burning with urination. He does not have any associated lower back or flank pain/discomfort. He denies any visible blood in the urine. Not associated with  fevers or chills, nausea/vomiting.       ROS:  ROS:  A complete review of systems was performed.  All systems are negative except for pertinent findings as noted.   ROS  Allergies  Allergen Reactions   Cephalexin Rash   Penicillins Rash   Sulfonamide Derivatives Rash    Outpatient Encounter Medications as of 06/13/2021  Medication Sig   b complex vitamins tablet Take 1 tablet by mouth daily.   Cholecalciferol (VITAMIN D3) 2000 UNITS TABS Take 2,000 Units by mouth daily.   Coenzyme Q10 200 MG capsule Take 200 mg by mouth daily.   gabapentin (NEURONTIN) 100 MG capsule Take 100 mg by mouth at bedtime as needed.   levothyroxine (SYNTHROID) 88 MCG tablet Take 88 mcg by mouth daily before breakfast.   loratadine (CLARITIN) 10 MG tablet Take 10 mg by mouth daily.   losartan (COZAAR) 50 MG tablet Take 50 mg by mouth daily.   meloxicam (MOBIC) 15 MG tablet Take 15 mg by mouth daily as needed for pain.    Omega-3 1400 MG CAPS    pantoprazole (PROTONIX) 40 MG tablet Take 40 mg by mouth daily.   traMADol (ULTRAM) 50 MG tablet Take 50 mg by mouth every 6 (six) hours as needed for moderate pain.    Turmeric Curcumin 500 MG CAPS    ZOCOR 40 MG tablet TAKE ONE TABLET BY MOUTH IN THE EVENING.   [EXPIRED] ciprofloxacin (CIPRO) tablet 500 mg    No facility-administered encounter medications on file as of 06/13/2021.    Past Medical History:  Diagnosis Date   Anemia    Arthritis    oa   BPH (benign prostatic hyperplasia) 08/1989   GERD (gastroesophageal reflux disease) 11/29/1998   Hyperlipidemia 08/1997   Hypertension    Hypothyroidism 1983   Lesion of bladder    Lesion of bladder 02/28/2021   Pre-diabetes    Pulmonary nodules    Spermatocele    per Dr. Jeffie Pollock with URO   Urinary frequency 02/28/2021   and buring with urination    Past Surgical History:  Procedure Laterality Date   CATARACT EXTRACTION     left eye 03-2003 and right fall 2021   COLONOSCOPY  08/10/2009   polyp  in ascending colon otherwise normal   COLONOSCOPY WITH ESOPHAGOGASTRODUODENOSCOPY (EGD) N/A 10/28/2013   Dr. Rourk:Schatzki's ring - not manipulated because no dysphagia currently. Small hiatal hernia.  Gastric and duodenal erosions of uncertain significance-status post biopsy. path with benign findings, no villous atrophy, no H.pylori/TCS:Colonic diverticulosis   CYSTOSCOPY  08/1989   normal   CYSTOSCOPY WITH BIOPSY N/A 02/07/2020   Procedure: CYSTOSCOPY WITH BLADDER AND PROSTATIS URETHRAL  BIOPSY WIHT FULGURATION AND INSTILL GEMCITABINE;  Surgeon: Irine Seal, MD;  Location: [facility];  Service: Urology;  Laterality: N/A;   CYSTOSCOPY WITH BIOPSY Bilateral 08/09/2020   Procedure: CYSTOSCOPY WITH BLADDER BIOPSYAND FULGURATION BILATERAL RETROGRADE S;  Surgeon: Irine Seal, MD;  Location: [facility];  Service: Urology;  Laterality: Bilateral;   CYSTOSCOPY WITH BIOPSY N/A 03/05/2021   Procedure: CYSTOSCOPY WITH BIOPSY FULGURATION;  Surgeon: Irine Seal, MD;  Location[facility];  Service: Urology;  Laterality: N/A;   ESOPHAGOGASTRODUODENOSCOPY  10/1999   distal esoph. stricture; sliding H. H.   LASIK  04/12/2003   right eye   lumbar microdisectomy  2003   Dr. Hal Neer    Social History   Socioeconomic History   Marital status: Married    Spouse name: Not on file   Number of children: 1   Years of education: Not on file   Highest education level: Not on file  Occupational History   Occupation: Music therapist: [employer]    Comment: Music therapist  Tobacco Use   Smoking status: Never   Smokeless tobacco: Never  Vaping Use   Vaping Use: Never used  Substance and Sexual Activity   Alcohol use: No   Drug use: No   Sexual activity: Yes  Other Topics Concern   Not on file  Social History Narrative   Married and lives with wife   Social Determinants of Health   Financial Resource Strain: Not on file  Food Insecurity: Not on file   Transportation Needs: Not on file  Physical Activity: Not on file  Stress: Not on file  Social Connections: Not on file  Intimate Partner Violence: Not on file    Family History  Problem Relation Age of Onset   Hypertension Mother    Hyperlipidemia Mother    Diabetes Mother 45   Heart disease Father        CABG X 4  12/2001   Hyperlipidemia Father    Hypertension Father    Heart disease Brother        CABG X 1   Heart disease Paternal Uncle        MI   Cancer Maternal Grandfather        colon   Diabetes Maternal Uncle    Arthritis Maternal Uncle    Stroke Paternal Grandfather    Allergic rhinitis Neg Hx    Angioedema Neg Hx    Asthma Neg Hx    Atopy Neg Hx    Eczema Neg Hx    Immunodeficiency Neg Hx    Urticaria Neg Hx        Objective: Vitals:   06/13/21 1032  BP: (!) 154/82  Pulse: 79  Temp: 98 F (36.7 C)     Physical Exam  Lab Results:  PSA PSA  Date Value Ref Range Status  08/05/2011 0.98 0.10 - 4.00 ng/mL Final  07/22/2010 0.85 0.10 - 4.00 ng/mL Final  03/26/2009 1.03 0.10 - 4.00 ng/mL Final   No results found for: TESTOSTERONE    Studies/Results: Results for orders placed or performed in visit on 06/13/21 (from the past 24 hour(s))  Urinalysis, Routine w reflex microscopic     Status: Abnormal   Collection Time: 06/13/21 10:34 AM  Result Value Ref Range   Specific Gravity, UA 1.020 1.005 - 1.030   pH, UA 6.0 5.0 - 7.5   Color, UA Amber (A) Yellow   Appearance Ur Clear Clear   Leukocytes,UA 1+ (A) Negative   Protein,UA 1+ (A) Negative/Trace   Glucose, UA Negative Negative  Ketones, UA Trace (A) Negative   RBC, UA 2+ (A) Negative   Bilirubin, UA Negative Negative   Urobilinogen, Ur 1.0 0.2 - 1.0 mg/dL   Nitrite, UA Negative Negative   Microscopic Examination See below:    Narrative   Performed at:  [facility] [address] [place], [place]  [id number] Lab Director: Mina Marble MT, Phone:  [phone]   Microscopic Examination     Status: Abnormal   Collection Time: 06/13/21 10:34 AM   Urine  Result Value Ref Range   WBC, UA 11-30 (A) 0 - 5 /hpf   RBC 3-10 (A) 0 - 2 /hpf   Epithelial Cells (non renal) 0-10 0 - 10 /hpf   Renal Epithel, UA None seen None seen /hpf   Casts Present None seen /lpf   Cast Type Hyaline casts N/A   Mucus, UA Present Not Estab.   Bacteria, UA None seen None seen/Few   Narrative   Performed at:  [facility] [address] [place], [place]  [id number] Lab Director: [place], Phone:  [phone]    Procedure:  Flexible cystoscopy.  He was prepped with betadine and 2% lidocaine jelly.  Cipro 500mg  given po.  The scope was passed and the urethra was normal.  There was some blanching of the external sphincter but the scope passed.  He has bilobar hyperplasia with some coaptation.  The bladder wall has mild/mod trabeculation.   There is a persistent lesion on the left anterior and lateral wall with some inflammatory exudate and persistent erythema.  This was inflammatory on recent biopsy.  The UO's are normal.    I have reviewed notes from ID and pulmonary.   Assessment & Plan: Hx of  CIS s/p BCG induction x 2.   The most recent biopsies show no recurrent CIS.   He is doing well without voiding symptoms but still has some pyuria and microhematuria with inflammatory changes on the bladder wall.  Urine culture today.   He will return in 3 months for cystoscopy.  I don't think further BCG would be appropriate.  Meds ordered this encounter  Medications   ciprofloxacin (CIPRO) tablet 500 mg       Orders Placed This Encounter  Procedures   Urine Culture   Microscopic Examination   Urinalysis, Routine w reflex microscopic      Return in about 3 months (around 09/13/2021) for with cystoscopy.   CC: Celene Squibb, MD      Irine Seal 06/13/2021 Patient ID: Tina Griffiths, male   DOB: 1955/09/02, 65 y.o.   MRN: [id number] Patient ID:  Tina Griffiths, male   DOB: May 30, 1956, 65 y.o.   MRN: [id number]

## 2021-06-13 NOTE — Progress Notes (Signed)

## 2021-06-15 LAB — URINE CULTURE

## 2021-06-17 IMAGING — DX DG CHEST 2V
2 series · 2 of 2 positions shown · non-contrast
Comparison: 04/10/2020 chest x-ray, CT chest 02/16/2020

CLINICAL DATA: Follow-up pulmonary nodules

EXAM:
CHEST - 2 VIEW

[chest pa]
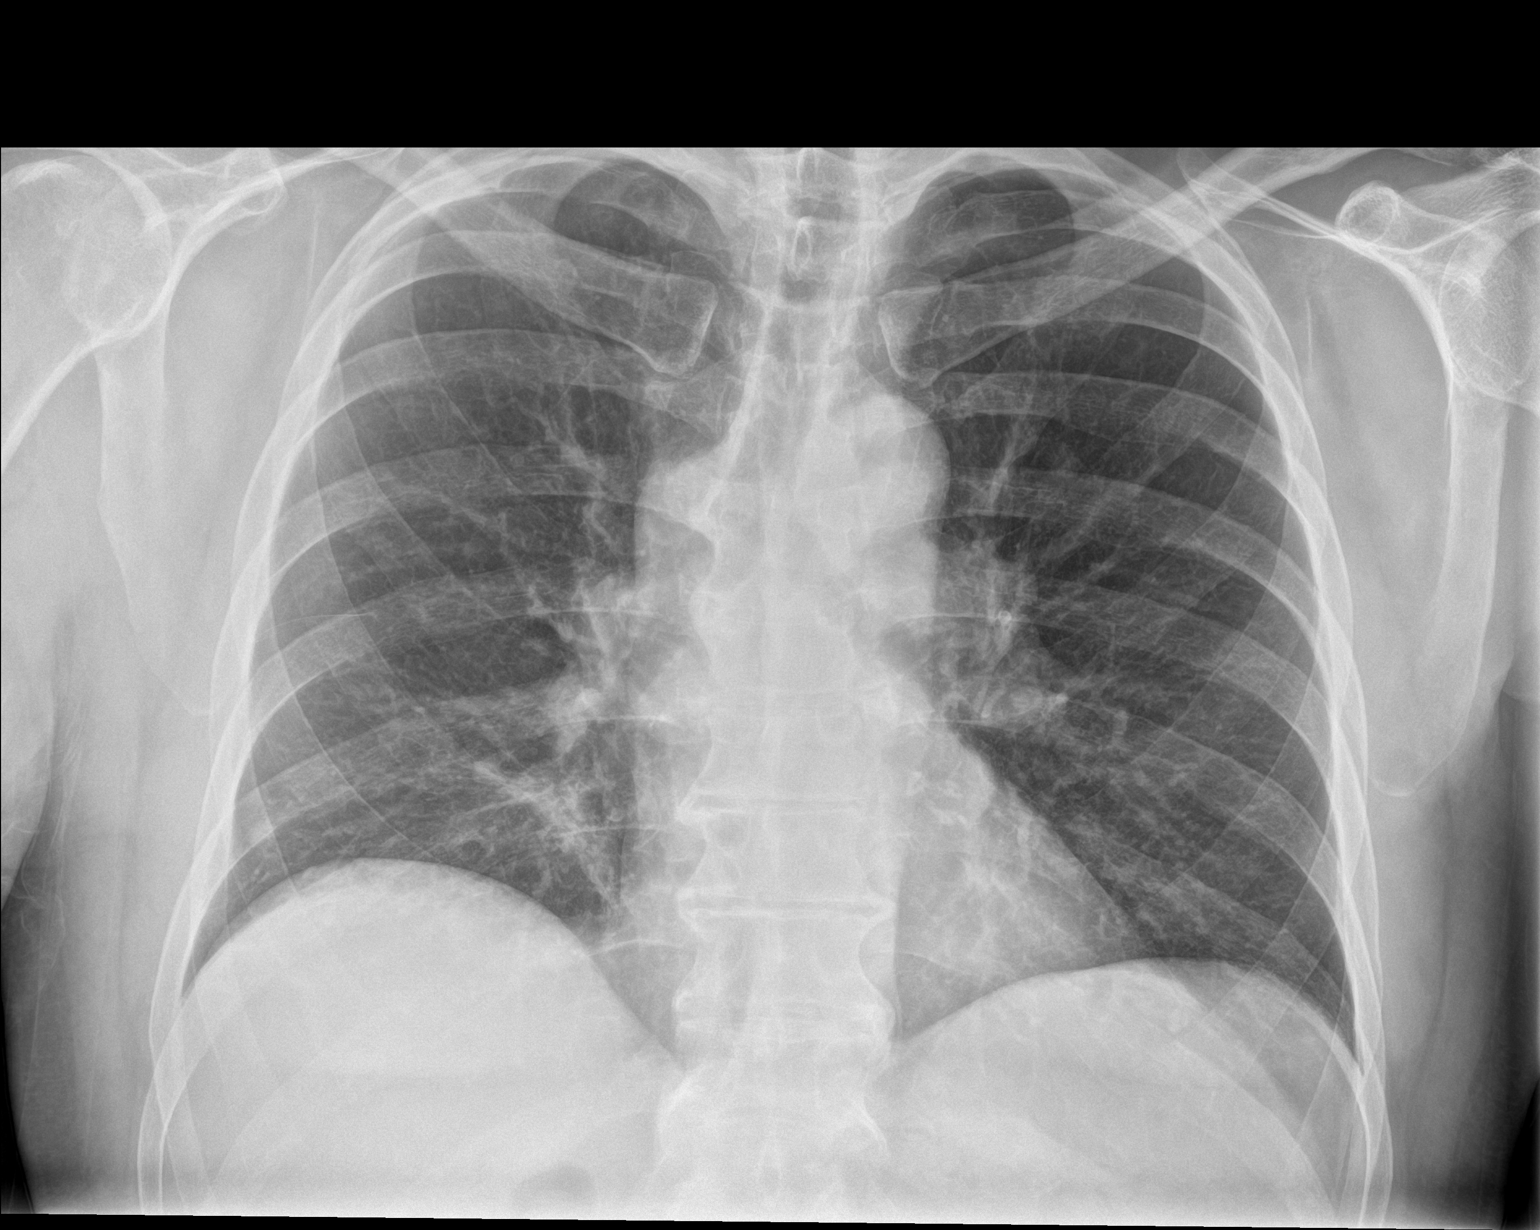

[chest lat]
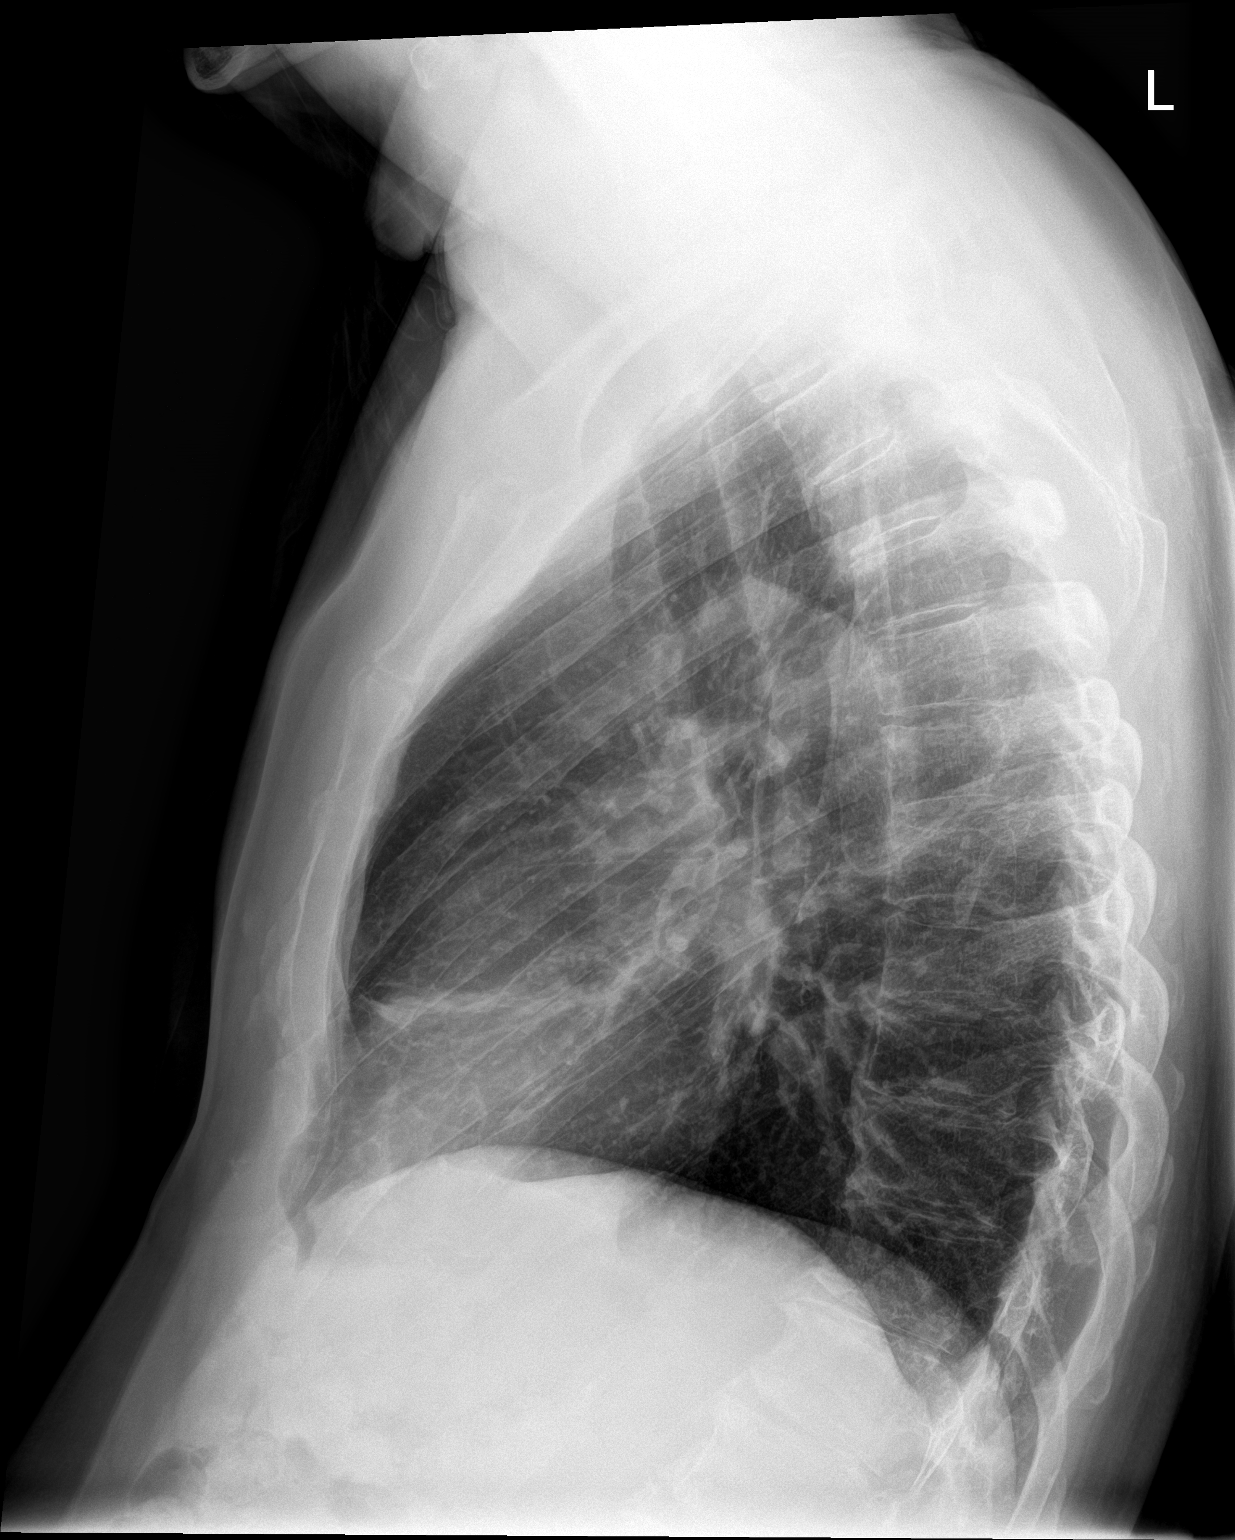

[2 of 2 positions shown; findings below may reference images not displayed]

FINDINGS: Left upper lobe and right lower lobe pulmonary nodules which
correlate with calcified pulmonary nodules on prior CT dated
02/16/2020 likely reflecting sequela of granulomatous disease. Many
of the pulmonary nodule seen on the CT dated 02/16/2020 there are
not visualized on the current examination. No focal consolidation.
No pleural effusion or pneumothorax. Normal heart mediastinum.
Bilateral calcified hilar lymph nodes.

No acute osseous abnormality.
IMPRESSION: No acute cardiopulmonary disease.

Stable chest x-ray from 04/10/2020.

Many of the pulmonary nodule seen on the CT dated 02/16/2020 there
are not visualized on the current examination. The recommendation
from the CT of the chest dated 02/16/2020 was as follows: Overall,
these findings are compatible with sarcoidosis. None of these
nodules are considered overly suspicious for primary bronchogenic
neoplasm in that context. If clinically warranted, a single
follow-up CT chest can be considered in 6-12 months.

## 2021-06-17 NOTE — Progress Notes (Signed)
Sent via mychart

## 2021-07-31 DIAGNOSIS — E782 Mixed hyperlipidemia: Secondary | ICD-10-CM | POA: Diagnosis not present

## 2021-07-31 DIAGNOSIS — I10 Essential (primary) hypertension: Secondary | ICD-10-CM | POA: Diagnosis not present

## 2021-08-15 DIAGNOSIS — E119 Type 2 diabetes mellitus without complications: Secondary | ICD-10-CM | POA: Diagnosis not present

## 2021-08-19 DIAGNOSIS — E118 Type 2 diabetes mellitus with unspecified complications: Secondary | ICD-10-CM | POA: Diagnosis not present

## 2021-08-19 DIAGNOSIS — E782 Mixed hyperlipidemia: Secondary | ICD-10-CM | POA: Diagnosis not present

## 2021-08-19 DIAGNOSIS — E039 Hypothyroidism, unspecified: Secondary | ICD-10-CM | POA: Diagnosis not present

## 2021-09-03 DIAGNOSIS — K219 Gastro-esophageal reflux disease without esophagitis: Secondary | ICD-10-CM | POA: Diagnosis not present

## 2021-09-03 DIAGNOSIS — K7689 Other specified diseases of liver: Secondary | ICD-10-CM | POA: Diagnosis not present

## 2021-09-03 DIAGNOSIS — J309 Allergic rhinitis, unspecified: Secondary | ICD-10-CM | POA: Diagnosis not present

## 2021-09-03 DIAGNOSIS — N182 Chronic kidney disease, stage 2 (mild): Secondary | ICD-10-CM | POA: Diagnosis not present

## 2021-09-19 ENCOUNTER — Encounter: Payer: Self-pay | Admitting: Urology

## 2021-09-19 ENCOUNTER — Other Ambulatory Visit: Payer: Self-pay

## 2021-09-19 ENCOUNTER — Ambulatory Visit: Payer: Medicare Other | Admitting: Urology

## 2021-09-19 VITALS — BP 151/90 | HR 76

## 2021-09-19 DIAGNOSIS — Z8551 Personal history of malignant neoplasm of bladder: Secondary | ICD-10-CM

## 2021-09-19 DIAGNOSIS — R300 Dysuria: Secondary | ICD-10-CM | POA: Diagnosis not present

## 2021-09-19 DIAGNOSIS — R350 Frequency of micturition: Secondary | ICD-10-CM

## 2021-09-19 DIAGNOSIS — R3129 Other microscopic hematuria: Secondary | ICD-10-CM | POA: Diagnosis not present

## 2021-09-19 LAB — MICROSCOPIC EXAMINATION
Bacteria, UA: NONE SEEN
Epithelial Cells (non renal): NONE SEEN /hpf (ref 0–10)
Renal Epithel, UA: NONE SEEN /hpf
WBC, UA: NONE SEEN /hpf (ref 0–5)

## 2021-09-19 LAB — URINALYSIS, ROUTINE W REFLEX MICROSCOPIC
Bilirubin, UA: NEGATIVE
Glucose, UA: NEGATIVE
Ketones, UA: NEGATIVE
Leukocytes,UA: NEGATIVE
Nitrite, UA: NEGATIVE
Protein,UA: NEGATIVE
Specific Gravity, UA: 1.015 (ref 1.005–1.030)
Urobilinogen, Ur: 0.2 mg/dL (ref 0.2–1.0)
pH, UA: 7 (ref 5.0–7.5)

## 2021-09-19 MED ORDER — CIPROFLOXACIN HCL 500 MG PO TABS
500.0000 mg | ORAL_TABLET | Freq: Once | ORAL | Status: AC
Start: 2021-09-19 — End: 2021-09-19
  Administered 2021-09-19: 500 mg via ORAL

## 2021-09-19 NOTE — Progress Notes (Signed)

## 2021-09-19 NOTE — Progress Notes (Signed)
Subjective:  1. History of bladder cancer   2. Urinary frequency   3. Burning with urination   4. Microhematuria     09/19/21: Roey returns today in f/u.  His UA today has 2+ blood.  He is voiding well without further dysuria.   06/13/21: Timofey returns today in f/u for his history of CIS for surveillance cystoscopy.  His last positive biopsy was in 12/21.   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  He was treated with nitrofurantoin for MRSA.  He has no hematuria or voiding symptoms at this time.   He has seen Dr. Melvyn Novas for concern about pulmonary nodules had a stable CXR on 05/23/21 with findings consistent with his history of sarcoidosis.  I have reviewed notes from ID and Pulmonary and the CXR report. .   03/14/21: Elray returns today in f/u from his recent cystoscopy with bladder biopsy on 03/05/21.   The biopsies showed no CIS and only mild hyperemia and inflammation.  No granulomatous changes were noted.  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  He continues to have dysuria and is back on Azo with some relief.   He has no further hematuria.    02/14/21: Dallin returns today for repeat cystoscopy.  He had bladder wall erythema following a second induction course of BCG at his last visit on 01/03/21.   A cytology was negative at that visit.  His UA today has 3-10 RBC's.   He continues to have frequency and dysuria despite a course of Levaquin.   01/03/21: Laderius returns today in f/u.  He was given Levaquin 500mg  x 7 days in mid April and the burning is improving.  HIs UA today has 3-10 RBC's today.  He continues to have frequency and nocturia that are improving.  He was up 3 x last night.  His last BCG of the second induction course for his persistent CIS was in February.    11/15/20: Demonie returns today f/u for his history of CIS.   He had his last BCG about a month ago and still has dysuria, frequency and urgency with  nocturia x 5.  He isn't sure he empties.  He has a good stream.  His UA today has 3-10 RBC's.   09/06/20: Maureen returns today in f/u from his recent cystoscopy with biopsy and fulguration on 08/09/20.  He was found to have recurrent/persistent CIS in 4/4 biopsy sites on the dome, base and posterior wall of the bladder.  He is having no hematuria or dysuria.  His IPSS is 6.    07/20/20: Marquist returns today in f/u for his history of CIS found on TURBT from a left lateral wall lesion on 02/07/20 that was followed by BCG induction treatment that he completed on 04/16/20 in the [place] office. Marland KitchenHe is voiding with reduced LUTS with an IPSS of 9.  He has had no hematuria.   01/16/20: Avan returns today in f/u. His cytology is positive. A CT today showed bilateral renal cysts and prostate enlargement but no bladder lesions or upper tract lesions. His IPSS remains elevated at 24.   01/04/20: Kamaree returns today in f/u for his LUTS with OAB. He was given a trial of Vesicare on 12/01/19 but didin't tolerate it or notice improvement . His IPSS is 25 with frequency and a sensation of incomplete emptying as primary issues. he has nocturia x 4 and  urgency. He has some chronic dysuria and Azo doesn't help. He had no gross hematuria but he has 3-10 RBC's today.   12/01/2019: Seen in the past for ED and balanitis by Dr Jeffie Pollock. He is on Androgel for hypogonadism managed by his primary care physician. Presents today complaining of worsening frequency/urgency and nocturia that has persistently increased since the fall of last year. His PCP has previously trialed him on tamsulosin, Toviaz, and Myrbetriq with patient reporting no notable difference is in voiding symptoms with either of the medications. Not associated with any hesitancy or straining. He tells me tries to drink a lot of water but that seems to only make symptoms worse. Also associated with chronic intermittent burning with urination. He does not have any associated lower back  or flank pain/discomfort. He denies any visible blood in the urine. Not associated with fevers or chills, nausea/vomiting.       ROS:  ROS:  A complete review of systems was performed.  All systems are negative except for pertinent findings as noted.   ROS  Allergies  Allergen Reactions   Cephalexin Rash   Penicillins Rash   Sulfonamide Derivatives Rash    Outpatient Encounter Medications as of 09/19/2021  Medication Sig   b complex vitamins tablet Take 1 tablet by mouth daily.   Cholecalciferol (VITAMIN D3) 2000 UNITS TABS Take 2,000 Units by mouth daily.   Coenzyme Q10 200 MG capsule Take 200 mg by mouth daily.   gabapentin (NEURONTIN) 100 MG capsule Take 100 mg by mouth at bedtime as needed.   levothyroxine (SYNTHROID) 88 MCG tablet Take 88 mcg by mouth daily before breakfast.   loratadine (CLARITIN) 10 MG tablet Take 10 mg by mouth daily.   losartan (COZAAR) 50 MG tablet Take 50 mg by mouth daily.   meloxicam (MOBIC) 15 MG tablet Take 15 mg by mouth daily as needed for pain.    Omega-3 1400 MG CAPS    pantoprazole (PROTONIX) 40 MG tablet Take 40 mg by mouth daily.   traMADol (ULTRAM) 50 MG tablet Take 50 mg by mouth every 6 (six) hours as needed for moderate pain.    Turmeric Curcumin 500 MG CAPS    ZOCOR 40 MG tablet TAKE ONE TABLET BY MOUTH IN THE EVENING.   [EXPIRED] ciprofloxacin (CIPRO) tablet 500 mg    No facility-administered encounter medications on file as of 09/19/2021.    Past Medical History:  Diagnosis Date   Anemia    Arthritis    oa   BPH (benign prostatic hyperplasia) 08/1989   GERD (gastroesophageal reflux disease) 11/29/1998   Hyperlipidemia 08/1997   Hypertension    Hypothyroidism 1983   Lesion of bladder    Lesion of bladder 02/28/2021   Pre-diabetes    Pulmonary nodules    Spermatocele    per Dr. Jeffie Pollock with URO   Urinary frequency 02/28/2021   and buring with urination    Past Surgical History:  Procedure Laterality Date   CATARACT  EXTRACTION     left eye 03-2003 and right fall 2021   COLONOSCOPY  08/10/2009   polyp in ascending colon otherwise normal   COLONOSCOPY WITH ESOPHAGOGASTRODUODENOSCOPY (EGD) N/A 10/28/2013   Dr. Rourk:Schatzki's ring - not manipulated because no dysphagia currently. Small hiatal hernia.  Gastric and duodenal erosions of uncertain significance-status post biopsy. path with benign findings, no villous atrophy, no H.pylori/TCS:Colonic diverticulosis   CYSTOSCOPY  08/1989   normal   CYSTOSCOPY WITH BIOPSY N/A 02/07/2020   Procedure: CYSTOSCOPY  WITH BLADDER AND PROSTATIS URETHRAL  BIOPSY WIHT FULGURATION AND INSTILL GEMCITABINE;  Surgeon: Irine Seal, MD;  Location: [facility];  Service: Urology;  Laterality: N/A;   CYSTOSCOPY WITH BIOPSY Bilateral 08/09/2020   Procedure: CYSTOSCOPY WITH BLADDER BIOPSYAND FULGURATION BILATERAL RETROGRADE S;  Surgeon: Irine Seal, MD;  Location: [facility];  Service: Urology;  Laterality: Bilateral;   CYSTOSCOPY WITH BIOPSY N/A 03/05/2021   Procedure: CYSTOSCOPY WITH BIOPSY FULGURATION;  Surgeon: Irine Seal, MD;  Location: [facility];  Service: Urology;  Laterality: N/A;   ESOPHAGOGASTRODUODENOSCOPY  10/1999   distal esoph. stricture; sliding H. H.   LASIK  04/12/2003   right eye   lumbar microdisectomy  2003   Dr. Hal Neer    Social History   Socioeconomic History   Marital status: Married    Spouse name: Not on file   Number of children: 1   Years of education: Not on file   Highest education level: Not on file  Occupational History   Occupation: Music therapist: [employer]    Comment: Music therapist  Tobacco Use   Smoking status: Never   Smokeless tobacco: Never  Vaping Use   Vaping Use: Never used  Substance and Sexual Activity   Alcohol use: No   Drug use: No   Sexual activity: Yes  Other Topics Concern   Not on file  Social History Narrative   Married and lives with wife   Social Determinants of  Health   Financial Resource Strain: Not on file  Food Insecurity: Not on file  Transportation Needs: Not on file  Physical Activity: Not on file  Stress: Not on file  Social Connections: Not on file  Intimate Partner Violence: Not on file    Family History  Problem Relation Age of Onset   Hypertension Mother    Hyperlipidemia Mother    Diabetes Mother 37   Heart disease Father        CABG X 4  12/2001   Hyperlipidemia Father    Hypertension Father    Heart disease Brother        CABG X 1   Heart disease Paternal Uncle        MI   Cancer Maternal Grandfather        colon   Diabetes Maternal Uncle    Arthritis Maternal Uncle    Stroke Paternal Grandfather    Allergic rhinitis Neg Hx    Angioedema Neg Hx    Asthma Neg Hx    Atopy Neg Hx    Eczema Neg Hx    Immunodeficiency Neg Hx    Urticaria Neg Hx        Objective: Vitals:   09/19/21 0938  BP: (!) 151/90  Pulse: 76     Physical Exam  Lab Results:  PSA PSA  Date Value Ref Range Status  08/05/2011 0.98 0.10 - 4.00 ng/mL Final  07/22/2010 0.85 0.10 - 4.00 ng/mL Final  03/26/2009 1.03 0.10 - 4.00 ng/mL Final   No results found for: TESTOSTERONE    Studies/Results: Results for orders placed or performed in visit on 09/19/21 (from the past 24 hour(s))  Urinalysis, Routine w reflex microscopic     Status: Abnormal   Collection Time: 09/19/21 10:27 AM  Result Value Ref Range   Specific Gravity, UA 1.015 1.005 - 1.030   pH, UA 7.0 5.0 - 7.5   Color, UA Yellow Yellow   Appearance Ur Clear Clear   Leukocytes,UA  Negative Negative   Protein,UA Negative Negative/Trace   Glucose, UA Negative Negative   Ketones, UA Negative Negative   RBC, UA 2+ (A) Negative   Bilirubin, UA Negative Negative   Urobilinogen, Ur 0.2 0.2 - 1.0 mg/dL   Nitrite, UA Negative Negative   Microscopic Examination See below:    Narrative   Performed at:  [facility] [address], [place], [place]   [id number] Lab Director: Mina Marble MT, Phone:  [phone]  Microscopic Examination     Status: Abnormal   Collection Time: 09/19/21 10:27 AM   Urine  Result Value Ref Range   WBC, UA None seen 0 - 5 /hpf   RBC 11-30 (A) 0 - 2 /hpf   Epithelial Cells (non renal) None seen 0 - 10 /hpf   Renal Epithel, UA None seen None seen /hpf   Mucus, UA Present Not Estab.   Bacteria, UA None seen None seen/Few   Narrative   Performed at:  [facility] [address], [place], [place]  [id number] Lab Director: [place], Phone:  [phone]     Procedure:  Flexible cystoscopy.  He was prepped with betadine and 2% lidocaine jelly.  Cipro 500mg  given po.  The scope was passed and the urethra was normal.  There was some blanching of the external sphincter but the scope passed.  He has bilobar hyperplasia with some coaptation.  The bladder wall has mild/mod trabeculation.   There is a persistent lesion on the left anterior and lateral wall with some inflammatory exudate and persistent erythema. The lesion is about 0.5 x 2.0 cm along a trabecular band.  There is no apparent change.  This was inflammatory on prior biopsy.  The UO's are normal.    I have reviewed notes from ID and pulmonary.   Assessment & Plan: Hx of  CIS s/p BCG induction x 2.   The most recent biopsies show no recurrent CIS.   He is doing well without voiding symptoms but still has some inflammatory changes on the bladder wall and some microhematuria.  Cytology today.   He will return in 3 months for cystoscopy.  I don't think further BCG would be appropriate.  Meds ordered this encounter  Medications   ciprofloxacin (CIPRO) tablet 500 mg       Orders Placed This Encounter  Procedures   Microscopic Examination   Urinalysis, Routine w reflex microscopic      Return in about 3 months (around 12/18/2021) for cysto on return.   CC: Celene Squibb, MD      Irine Seal 09/19/2021 Patient ID: Tina Griffiths,  male   DOB: 12-16-1955, 66 y.o.   MRN: [id number] Patient ID: Tina Griffiths, male   DOB: April 02, 1956, 66 y.o.   MRN: [id number] Patient ID: Tina Griffiths, male   DOB: 1955-09-05, 66 y.o.   MRN: [id number]

## 2021-09-21 LAB — CYTOLOGY - NON PAP

## 2021-12-26 ENCOUNTER — Ambulatory Visit: Payer: Medicare Other | Admitting: Urology

## 2021-12-26 ENCOUNTER — Encounter: Payer: Self-pay | Admitting: Urology

## 2021-12-26 VITALS — BP 151/79 | HR 74

## 2021-12-26 DIAGNOSIS — Z86008 Personal history of in-situ neoplasm of other site: Secondary | ICD-10-CM

## 2021-12-26 DIAGNOSIS — Z87440 Personal history of urinary (tract) infections: Secondary | ICD-10-CM

## 2021-12-26 DIAGNOSIS — R350 Frequency of micturition: Secondary | ICD-10-CM

## 2021-12-26 DIAGNOSIS — Z8551 Personal history of malignant neoplasm of bladder: Secondary | ICD-10-CM

## 2021-12-26 LAB — URINALYSIS, ROUTINE W REFLEX MICROSCOPIC
Bilirubin, UA: NEGATIVE
Glucose, UA: NEGATIVE
Ketones, UA: NEGATIVE
Leukocytes,UA: NEGATIVE
Nitrite, UA: NEGATIVE
Protein,UA: NEGATIVE
Specific Gravity, UA: 1.025 (ref 1.005–1.030)
Urobilinogen, Ur: 0.2 mg/dL (ref 0.2–1.0)
pH, UA: 6 (ref 5.0–7.5)

## 2021-12-26 LAB — MICROSCOPIC EXAMINATION
Bacteria, UA: NONE SEEN
Epithelial Cells (non renal): NONE SEEN /hpf (ref 0–10)
RBC, Urine: NONE SEEN /hpf (ref 0–2)
Renal Epithel, UA: NONE SEEN /hpf
WBC, UA: NONE SEEN /hpf (ref 0–5)

## 2021-12-26 MED ORDER — CIPROFLOXACIN HCL 500 MG PO TABS
500.0000 mg | ORAL_TABLET | Freq: Once | ORAL | Status: AC
Start: 2021-12-26 — End: 2021-12-26
  Administered 2021-12-26: 500 mg via ORAL

## 2021-12-26 NOTE — Progress Notes (Signed)
?Subjective: ? ?1. History of bladder cancer   ?2. Urinary frequency   ?3. Personal history of urinary infection   ?  ?12/26/21: Isaac Guerrero returns today for surveillance cystoscopy for his history of CIS that was last detected on 08/09/20.  He has been treated with BCG with a second induction course completed in 5/22. He had persistent irritative symptoms that eventually resolved.  He last had a biopsy on 03/05/21 and that just showed inflammation.  He had a MRSA UTI in the past as well.  His UA is clear.  He is voiding well with an IPSS of 6 with nocturia x 1 and some frequency.  He has issues with ED but has a headaches with viagra and cialis.  He has had some recent right testicular pain that improved with gabapentin and has none in a month.  ? ? ? ? IPSS   ? ? [place] Name 12/26/21 1000  ?  ?  ?  ? International Prostate Symptom Score  ? How often have you had the sensation of not emptying your bladder? Less than 1 in 5    ? How often have you had to urinate less than every two hours? Less than half the time    ? How often have you found you stopped and started again several times when you urinated? Less than 1 in 5 times    ? How often have you found it difficult to postpone urination? Less than 1 in 5 times    ? How often have you had a weak urinary stream? Not at All    ? How often have you had to strain to start urination? Not at All    ? How many times did you typically get up at night to urinate? 1 Time    ? Total IPSS Score 6    ?  ? Quality of Life due to urinary symptoms  ? If you were to spend the rest of your life with your urinary condition just the way it is now how would you feel about that? Pleased    ? ?  ?  ? ?  ?  ? ?ROS: ? ?ROS:  ?A complete review of systems was performed.  All systems are negative except for pertinent findings as noted.  ? ?Review of Systems  ?All other systems reviewed and are negative. ? ?Allergies  ?Allergen Reactions  ? Cephalexin Rash  ? Penicillins Rash  ? Sulfonamide Derivatives  Rash  ? ? ?Outpatient Encounter Medications as of 12/26/2021  ?Medication Sig  ? b complex vitamins tablet Take 1 tablet by mouth daily.  ? Cholecalciferol (VITAMIN D3) 2000 UNITS TABS Take 2,000 Units by mouth daily.  ? Coenzyme Q10 200 MG capsule Take 200 mg by mouth daily.  ? gabapentin (NEURONTIN) 100 MG capsule Take 100 mg by mouth at bedtime as needed.  ? levothyroxine (SYNTHROID) 88 MCG tablet Take 88 mcg by mouth daily before breakfast.  ? loratadine (CLARITIN) 10 MG tablet Take 10 mg by mouth daily.  ? losartan (COZAAR) 50 MG tablet Take 50 mg by mouth daily.  ? meloxicam (MOBIC) 15 MG tablet Take 15 mg by mouth daily as needed for pain.   ? Omega-3 1400 MG CAPS   ? pantoprazole (PROTONIX) 40 MG tablet Take 40 mg by mouth daily.  ? traMADol (ULTRAM) 50 MG tablet Take 50 mg by mouth every 6 (six) hours as needed for moderate pain.   ? Turmeric Curcumin 500 MG CAPS   ?  ZOCOR 40 MG tablet TAKE ONE TABLET BY MOUTH IN THE EVENING.  ? [EXPIRED] ciprofloxacin (CIPRO) tablet 500 mg   ? ?No facility-administered encounter medications on file as of 12/26/2021.  ? ? ?Past Medical History:  ?Diagnosis Date  ? Anemia   ? Arthritis   ? oa  ? BPH (benign prostatic hyperplasia) 08/1989  ? GERD (gastroesophageal reflux disease) 11/29/1998  ? Hyperlipidemia 08/1997  ? Hypertension   ? Hypothyroidism 1983  ? Lesion of bladder   ? Lesion of bladder 02/28/2021  ? Pre-diabetes   ? Pulmonary nodules   ? Spermatocele   ? per Dr. Jeffie Pollock with URO  ? Urinary frequency 02/28/2021  ? and buring with urination  ? ? ?Past Surgical History:  ?Procedure Laterality Date  ? CATARACT EXTRACTION    ? left eye 03-2003 and right fall 2021  ? COLONOSCOPY  08/10/2009  ? polyp in ascending colon otherwise normal  ? COLONOSCOPY WITH ESOPHAGOGASTRODUODENOSCOPY (EGD) N/A 10/28/2013  ? Dr. Rourk:Schatzki's ring - not manipulated because no dysphagia currently. Small hiatal hernia.  Gastric and duodenal erosions of uncertain significance-status post biopsy.  path with benign findings, no villous atrophy, no H.pylori/TCS:Colonic diverticulosis  ? CYSTOSCOPY  08/1989  ? normal  ? CYSTOSCOPY WITH BIOPSY N/A 02/07/2020  ? Procedure: CYSTOSCOPY WITH BLADDER AND PROSTATIS URETHRAL  BIOPSY WIHT FULGURATION AND INSTILL GEMCITABINE;  Surgeon: Irine Seal, MD;  Location: [facility];  Service: Urology;  Laterality: N/A;  ? CYSTOSCOPY WITH BIOPSY Bilateral 08/09/2020  ? Procedure: CYSTOSCOPY WITH BLADDER BIOPSYAND FULGURATION BILATERAL RETROGRADE S;  Surgeon: Irine Seal, MD;  Location: [facility];  Service: Urology;  Laterality: Bilateral;  ? CYSTOSCOPY WITH BIOPSY N/A 03/05/2021  ? Procedure: CYSTOSCOPY WITH BIOPSY FULGURATION;  Surgeon: Irine Seal, MD;  Location: [facility];  Service: Urology;  Laterality: N/A;  ? ESOPHAGOGASTRODUODENOSCOPY  10/1999  ? distal esoph. stricture; sliding H. H.  ? LASIK  04/12/2003  ? right eye  ? lumbar microdisectomy  2003  ? Dr. Hal Neer  ? ? ?Social History  ? ?Socioeconomic History  ? Marital status: Married  ?  Spouse name: Not on file  ? Number of children: 1  ? Years of education: Not on file  ? Highest education level: Not on file  ?Occupational History  ? Occupation: Dealer  ?  Employer: [employer]  ?  Comment: [employer]  ?Tobacco Use  ? Smoking status: Never  ? Smokeless tobacco: Never  ?Vaping Use  ? Vaping Use: Never used  ?Substance and Sexual Activity  ? Alcohol use: No  ? Drug use: No  ? Sexual activity: Yes  ?Other Topics Concern  ? Not on file  ?Social History Narrative  ? Married and lives with wife  ? ?Social Determinants of Health  ? ?Financial Resource Strain: Not on file  ?Food Insecurity: Not on file  ?Transportation Needs: Not on file  ?Physical Activity: Not on file  ?Stress: Not on file  ?Social Connections: Not on file  ?Intimate Partner Violence: Not on file  ? ? ?Family History  ?Problem Relation Age of Onset  ? Hypertension Mother   ? Hyperlipidemia Mother   ? Diabetes Mother  67  ? Heart disease Father   ?     CABG X 4  12/2001  ? Hyperlipidemia Father   ? Hypertension Father   ? Heart disease Brother   ?     CABG X 1  ? Heart disease Paternal Uncle   ?  MI  ? Cancer Maternal Grandfather   ?     colon  ? Diabetes Maternal Uncle   ? Arthritis Maternal Uncle   ? Stroke Paternal Grandfather   ? Allergic rhinitis Neg Hx   ? Angioedema Neg Hx   ? Asthma Neg Hx   ? Atopy Neg Hx   ? Eczema Neg Hx   ? Immunodeficiency Neg Hx   ? Urticaria Neg Hx   ? ? ? ? ? ?Objective: ?Vitals:  ? 12/26/21 1018  ?BP: (!) 151/79  ?Pulse: 74  ? ? ? ?Physical Exam ? ?Lab Results:  ?PSA ?PSA  ?Date Value Ref Range Status  ?08/05/2011 0.98 0.10 - 4.00 ng/mL Final  ?07/22/2010 0.85 0.10 - 4.00 ng/mL Final  ?03/26/2009 1.03 0.10 - 4.00 ng/mL Final  ? ?No results found for: TESTOSTERONE ? ?UA is clear.  ? ?Studies/Results: ?Results for orders placed or performed in visit on 12/26/21 (from the past 24 hour(s))  ?Urinalysis, Routine w reflex microscopic     Status: Abnormal  ? Collection Time: 12/26/21 10:44 AM  ?Result Value Ref Range  ? Specific Gravity, UA 1.025 1.005 - 1.030  ? pH, UA 6.0 5.0 - 7.5  ? Color, UA Yellow Yellow  ? Appearance Ur Clear Clear  ? Leukocytes,UA Negative Negative  ? Protein,UA Negative Negative/Trace  ? Glucose, UA Negative Negative  ? Ketones, UA Negative Negative  ? RBC, UA Trace (A) Negative  ? Bilirubin, UA Negative Negative  ? Urobilinogen, Ur 0.2 0.2 - 1.0 mg/dL  ? Nitrite, UA Negative Negative  ? Microscopic Examination See below:   ? Narrative  ? Performed at:  [facility] ?[address], [place], [place]  [id number] ?Lab Director: [place], Phone:  [phone]  ?Microscopic Examination     Status: None  ? Collection Time: 12/26/21 10:44 AM  ? Urine  ?Result Value Ref Range  ? WBC, UA None seen 0 - 5 /hpf  ? RBC None seen 0 - 2 /hpf  ? Epithelial Cells (non renal) None seen 0 - 10 /hpf  ? Renal Epithel, UA None seen None seen /hpf  ? Bacteria, UA None seen  None seen/Few  ? Narrative  ? Performed at:  [facility] ?[address] [place], [place]  [id number] ?Lab Director: [place], Phone:  [phone]  ? ? ? ? ?Procedure:  Flexibl

## 2021-12-27 DIAGNOSIS — E782 Mixed hyperlipidemia: Secondary | ICD-10-CM | POA: Diagnosis not present

## 2021-12-27 DIAGNOSIS — E039 Hypothyroidism, unspecified: Secondary | ICD-10-CM | POA: Diagnosis not present

## 2021-12-27 DIAGNOSIS — E118 Type 2 diabetes mellitus with unspecified complications: Secondary | ICD-10-CM | POA: Diagnosis not present

## 2021-12-27 LAB — CYTOLOGY, URINE

## 2022-01-01 DIAGNOSIS — K219 Gastro-esophageal reflux disease without esophagitis: Secondary | ICD-10-CM | POA: Diagnosis not present

## 2022-01-01 DIAGNOSIS — K7689 Other specified diseases of liver: Secondary | ICD-10-CM | POA: Diagnosis not present

## 2022-01-01 DIAGNOSIS — N182 Chronic kidney disease, stage 2 (mild): Secondary | ICD-10-CM | POA: Diagnosis not present

## 2022-01-01 DIAGNOSIS — J309 Allergic rhinitis, unspecified: Secondary | ICD-10-CM | POA: Diagnosis not present

## 2022-01-13 IMAGING — DX DG CHEST 2V
2 series · 2 of 2 positions shown · non-contrast
Comparison: 10/25/2020, 04/10/2020

CLINICAL DATA: Sarcoidosis, pulmonary nodule follow-up

EXAM:
CHEST - 2 VIEW

[chest pa]
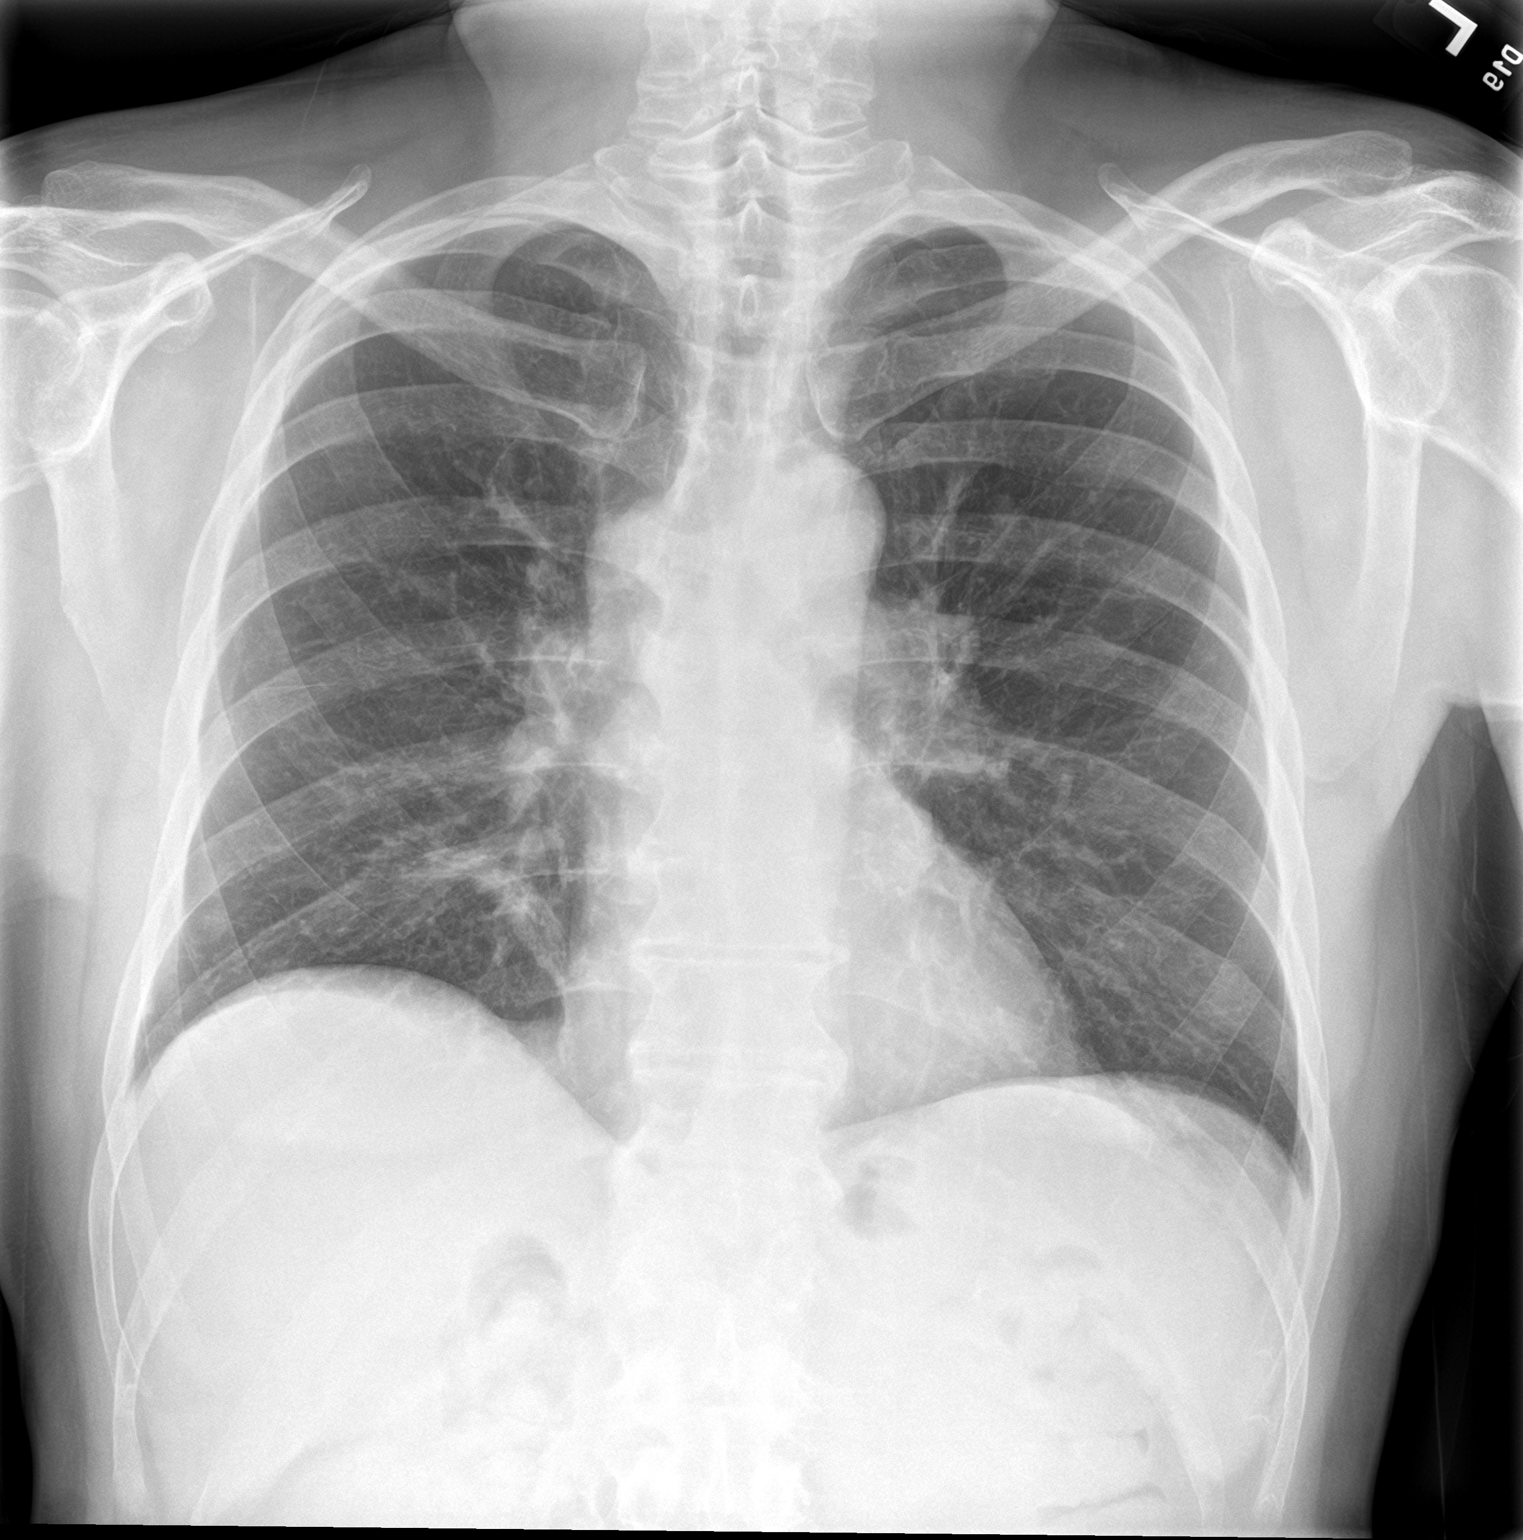

[chest lat]
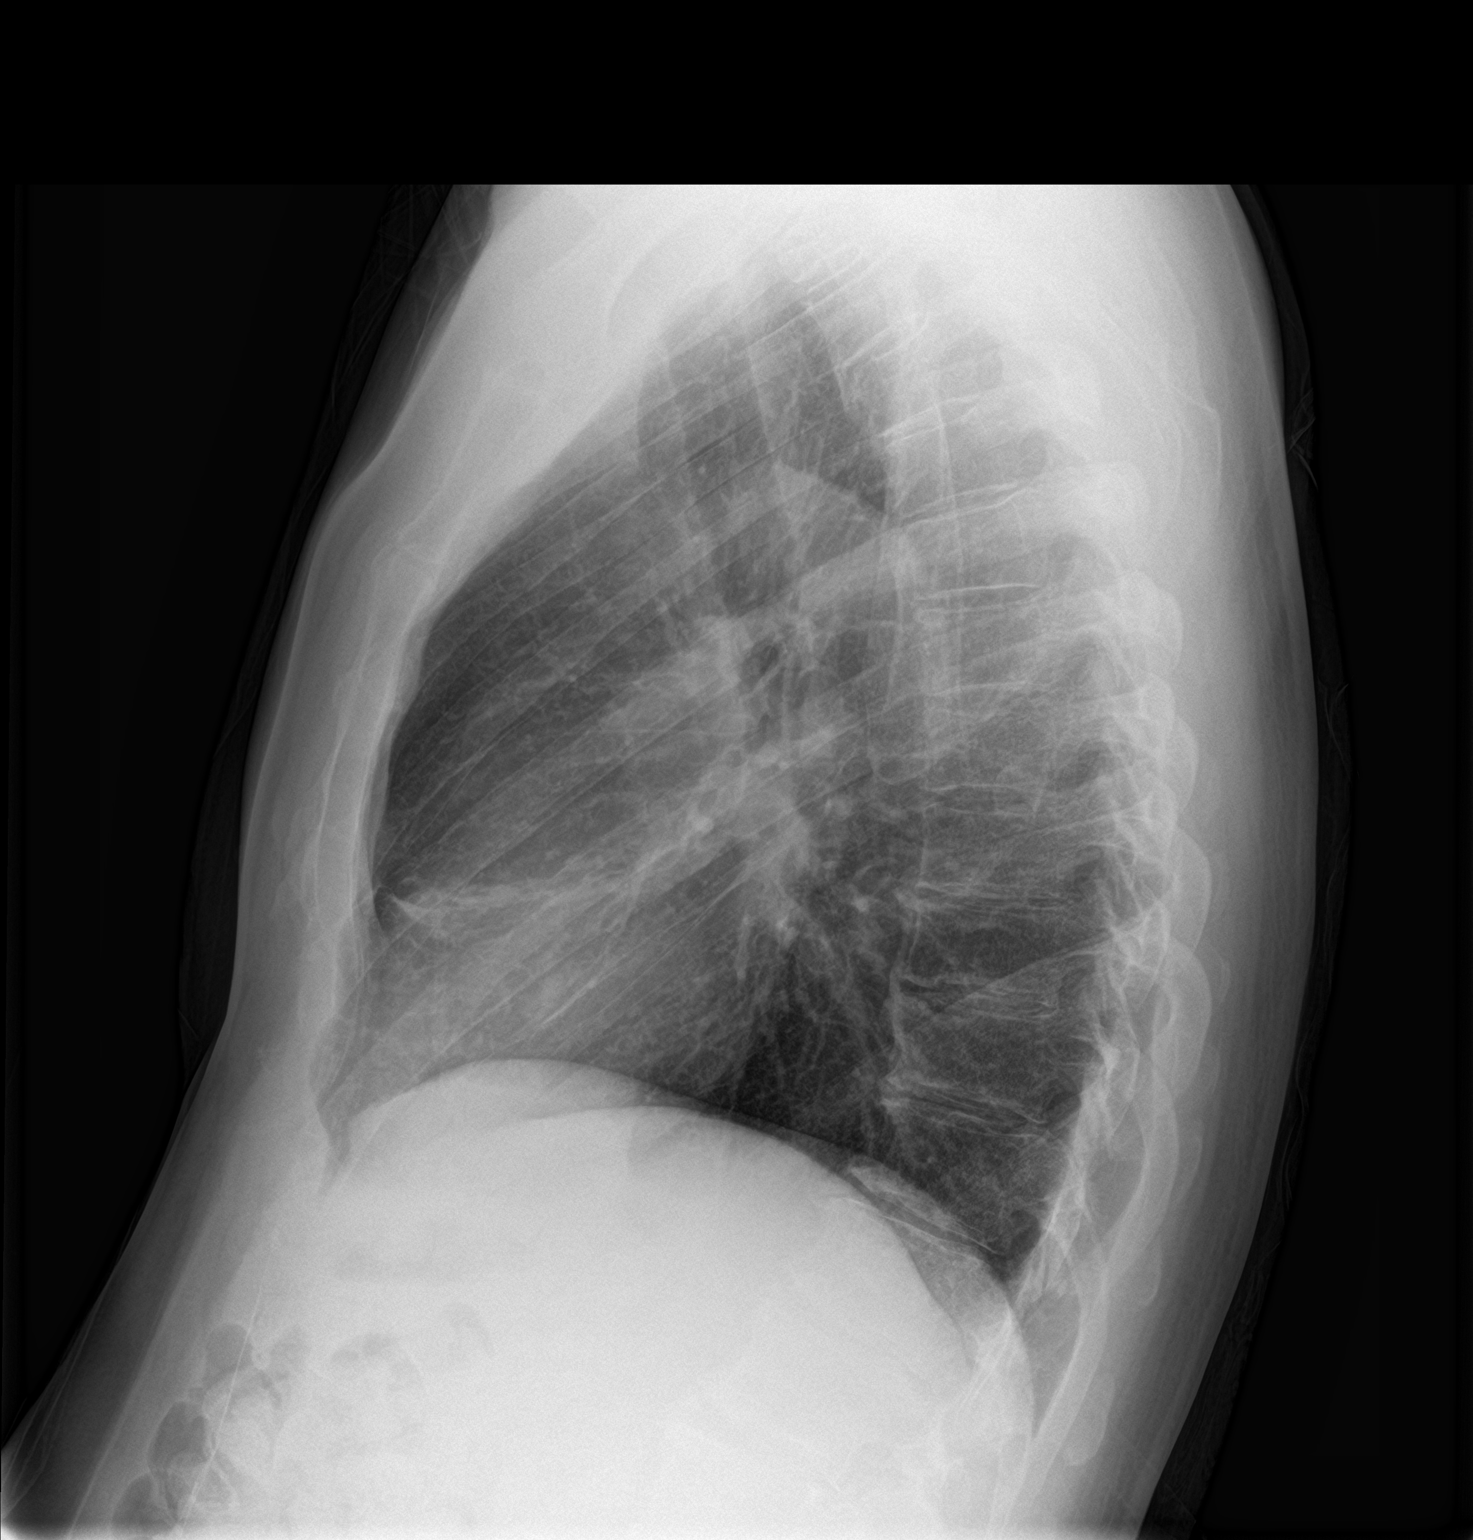

[2 of 2 positions shown; findings below may reference images not displayed]

FINDINGS: The lungs are symmetrically well inflated. Scarring within the right
middle lobe is unchanged. Small nodular densities within the left
suprahilar region and right costophrenic angle are unchanged. No new
focal pulmonary nodules or infiltrates are identified. Note that the
majority of pulmonary nodules are not well visualized on this
examination due to differences in imaging modality. No pneumothorax
or pleural effusion. Nodular enlargement of the left hilum and right
suprahilar region related to underlying mediastinal adenopathy
better seen on CT examination of 02/16/2020 appears unchanged.
Cardiac size is within normal limits. No acute bone abnormality.
IMPRESSION: Stable examination with unchanged pulmonary nodularity and nodular
enlargement of the pulmonary hila. These findings are better seen on
prior CT examination and were, altogether, in keeping with changes
of underlying sarcoidosis. As noted previously, the majority of
pulmonary nodules are not visualized and, if indicated, would be
better assessed with dedicated CT imaging.

## 2022-01-29 DIAGNOSIS — E039 Hypothyroidism, unspecified: Secondary | ICD-10-CM | POA: Diagnosis not present

## 2022-01-29 DIAGNOSIS — I129 Hypertensive chronic kidney disease with stage 1 through stage 4 chronic kidney disease, or unspecified chronic kidney disease: Secondary | ICD-10-CM | POA: Diagnosis not present

## 2022-01-29 DIAGNOSIS — E782 Mixed hyperlipidemia: Secondary | ICD-10-CM | POA: Diagnosis not present

## 2022-01-29 DIAGNOSIS — N182 Chronic kidney disease, stage 2 (mild): Secondary | ICD-10-CM | POA: Diagnosis not present

## 2022-03-27 ENCOUNTER — Encounter: Payer: Self-pay | Admitting: Urology

## 2022-03-27 ENCOUNTER — Ambulatory Visit: Payer: Medicare Other | Admitting: Urology

## 2022-03-27 VITALS — BP 146/80 | HR 76 | Ht 70.0 in | Wt 185.0 lb

## 2022-03-27 DIAGNOSIS — R350 Frequency of micturition: Secondary | ICD-10-CM | POA: Diagnosis not present

## 2022-03-27 DIAGNOSIS — Z8551 Personal history of malignant neoplasm of bladder: Secondary | ICD-10-CM | POA: Diagnosis not present

## 2022-03-27 DIAGNOSIS — R8289 Other abnormal findings on cytological and histological examination of urine: Secondary | ICD-10-CM | POA: Diagnosis not present

## 2022-03-27 MED ORDER — CIPROFLOXACIN HCL 500 MG PO TABS
500.0000 mg | ORAL_TABLET | Freq: Once | ORAL | Status: AC
  Administered 2022-03-27: 500 mg via ORAL

## 2022-03-27 NOTE — Progress Notes (Signed)
Subjective:  1. History of bladder cancer   2. Urinary frequency     7/272/3: Hager returns today for his history of CIS of the bladder.  He has had no hematuria.  His IPSS is 5 with nocturia x 1.  He had severe bladder inflammation after the BCG so maintenance was not felt to be appropriate.  He has some issues with ED but is not interested in therapy.     12/26/21: Julez returns today for surveillance cystoscopy for his history of CIS that was last detected on 08/09/20.  He has been treated with BCG with a second induction course completed in 5/22. He had persistent irritative symptoms that eventually resolved.  He last had a biopsy on 03/05/21 and that just showed inflammation.  He had a MRSA UTI in the past as well.  His UA is clear.  He is voiding well with an IPSS of 6 with nocturia x 1 and some frequency.  He has issues with ED but has a headaches with viagra and cialis.  He has had some recent right testicular pain that improved with gabapentin and has none in a month.         ROS:  ROS:  A complete review of systems was performed.  All systems are negative except for pertinent findings as noted.   Review of Systems  All other systems reviewed and are negative.   Allergies  Allergen Reactions   Cephalexin Rash   Penicillins Rash   Sulfonamide Derivatives Rash    Outpatient Encounter Medications as of 03/27/2022  Medication Sig   b complex vitamins tablet Take 1 tablet by mouth daily.   Cholecalciferol (VITAMIN D3) 2000 UNITS TABS Take 2,000 Units by mouth daily.   Coenzyme Q10 200 MG capsule Take 200 mg by mouth daily.   gabapentin (NEURONTIN) 100 MG capsule Take 100 mg by mouth at bedtime as needed.   levothyroxine (SYNTHROID) 88 MCG tablet Take 88 mcg by mouth daily before breakfast.   loratadine (CLARITIN) 10 MG tablet Take 10 mg by mouth daily.   losartan (COZAAR) 50 MG tablet Take 50 mg by mouth daily.   meloxicam (MOBIC) 15 MG tablet Take 15 mg by mouth daily as needed  for pain.    Omega-3 1400 MG CAPS    pantoprazole (PROTONIX) 40 MG tablet Take 40 mg by mouth daily.   traMADol (ULTRAM) 50 MG tablet Take 50 mg by mouth every 6 (six) hours as needed for moderate pain.    Turmeric Curcumin 500 MG CAPS    ZOCOR 40 MG tablet TAKE ONE TABLET BY MOUTH IN THE EVENING.   [EXPIRED] ciprofloxacin (CIPRO) tablet 500 mg    No facility-administered encounter medications on file as of 03/27/2022.    Past Medical History:  Diagnosis Date   Anemia    Arthritis    oa   BPH (benign prostatic hyperplasia) 08/1989   GERD (gastroesophageal reflux disease) 11/29/1998   Hyperlipidemia 08/1997   Hypertension    Hypothyroidism 1983   Lesion of bladder    Lesion of bladder 02/28/2021   Pre-diabetes    Pulmonary nodules    Spermatocele    per Dr. Jeffie Pollock with URO   Urinary frequency 02/28/2021   and buring with urination    Past Surgical History:  Procedure Laterality Date   CATARACT EXTRACTION     left eye 03-2003 and right fall 2021   COLONOSCOPY  08/10/2009   polyp in ascending colon otherwise normal   COLONOSCOPY  WITH ESOPHAGOGASTRODUODENOSCOPY (EGD) N/A 10/28/2013   Dr. Rourk:Schatzki's ring - not manipulated because no dysphagia currently. Small hiatal hernia.  Gastric and duodenal erosions of uncertain significance-status post biopsy. path with benign findings, no villous atrophy, no H.pylori/TCS:Colonic diverticulosis   CYSTOSCOPY  08/1989   normal   CYSTOSCOPY WITH BIOPSY N/A 02/07/2020   Procedure: CYSTOSCOPY WITH BLADDER AND PROSTATIS URETHRAL  BIOPSY WIHT FULGURATION AND INSTILL GEMCITABINE;  Surgeon: Irine Seal, MD;  Location: [facility];  Service: Urology;  Laterality: N/A;   CYSTOSCOPY WITH BIOPSY Bilateral 08/09/2020   Procedure: CYSTOSCOPY WITH BLADDER BIOPSYAND FULGURATION BILATERAL RETROGRADE S;  Surgeon: Irine Seal, MD;  Location: [facility];  Service: Urology;  Laterality: Bilateral;   CYSTOSCOPY WITH BIOPSY N/A 03/05/2021    Procedure: CYSTOSCOPY WITH BIOPSY FULGURATION;  Surgeon: Irine Seal, MD;  Location: [facility];  Service: Urology;  Laterality: N/A;   ESOPHAGOGASTRODUODENOSCOPY  10/1999   distal esoph. stricture; sliding H. H.   LASIK  04/12/2003   right eye   lumbar microdisectomy  2003   Dr. Hal Neer    Social History   Socioeconomic History   Marital status: Married    Spouse name: Not on file   Number of children: 1   Years of education: Not on file   Highest education level: Not on file  Occupational History   Occupation: Music therapist: [employer]    Comment: Music therapist  Tobacco Use   Smoking status: Never   Smokeless tobacco: Never  Vaping Use   Vaping Use: Never used  Substance and Sexual Activity   Alcohol use: No   Drug use: No   Sexual activity: Yes  Other Topics Concern   Not on file  Social History Narrative   Married and lives with wife   Social Determinants of Health   Financial Resource Strain: Not on file  Food Insecurity: Not on file  Transportation Needs: Not on file  Physical Activity: Not on file  Stress: Not on file  Social Connections: Not on file  Intimate Partner Violence: Not on file    Family History  Problem Relation Age of Onset   Hypertension Mother    Hyperlipidemia Mother    Diabetes Mother 52   Heart disease Father        CABG X 4  12/2001   Hyperlipidemia Father    Hypertension Father    Heart disease Brother        CABG X 1   Heart disease Paternal Uncle        MI   Cancer Maternal Grandfather        colon   Diabetes Maternal Uncle    Arthritis Maternal Uncle    Stroke Paternal Grandfather    Allergic rhinitis Neg Hx    Angioedema Neg Hx    Asthma Neg Hx    Atopy Neg Hx    Eczema Neg Hx    Immunodeficiency Neg Hx    Urticaria Neg Hx        Objective: Vitals:   03/27/22 1052  BP: (!) 146/80  Pulse: 76     Physical Exam  Lab Results:  PSA PSA  Date Value Ref Range Status   08/05/2011 0.98 0.10 - 4.00 ng/mL Final  07/22/2010 0.85 0.10 - 4.00 ng/mL Final  03/26/2009 1.03 0.10 - 4.00 ng/mL Final   No results found for: "TESTOSTERONE"  UA is clear.   Studies/Results: No results found for this or any previous  visit (from the past 24 hour(s)).     Procedure:  Flexible cystoscopy.  He was prepped with betadine and 2% lidocaine jelly.  Cipro '500mg'$  given po.  The scope was passed and the urethra was normal with the exception of a mild membranous stricture.  There was some blanching of the external sphincter but the scope passed.  He has bilobar hyperplasia with some coaptation.  The bladder wall has mild/mod trabeculation.  There is a right posterior wall scar.  There is a persistent lesion on the left anterior and lateral wall with some inflammatory exudate and persistent erythema. The lesion is about 0.5 x 1.5 cm along a trabecular band.  There is no apparent change.  This was inflammatory on prior biopsy.  The UO's are normal.      Assessment & Plan: Hx of  CIS s/p BCG induction x 2.   The most recent biopsies show no recurrent CIS.   He is doing well without significant voiding symptoms but still has some inflammatory changes on the bladder wall and some microhematuria.  Cytology today.   He will return in 3 months for cystoscopy.  I don't think further BCG would be appropriate.  He will need upper tract imaging in about 6 months if his next cystoscopy is clear.   Meds ordered this encounter  Medications   ciprofloxacin (CIPRO) tablet 500 mg       Orders Placed This Encounter  Procedures   Microscopic Examination   Urinalysis, Routine w reflex microscopic   Urine cytology   Return in about 3 months (around 06/27/2022) for cystoscopy.   CC: Celene Squibb, MD      Irine Seal 03/28/2022 Patient ID: Tina Griffiths, male   DOB: [birth year], 66 y.o.   MRN: [id number]

## 2022-03-28 LAB — URINALYSIS, ROUTINE W REFLEX MICROSCOPIC
Bilirubin, UA: NEGATIVE
Glucose, UA: NEGATIVE
Ketones, UA: NEGATIVE
Leukocytes,UA: NEGATIVE
Nitrite, UA: NEGATIVE
Protein,UA: NEGATIVE
Specific Gravity, UA: 1.015 (ref 1.005–1.030)
Urobilinogen, Ur: 0.2 mg/dL (ref 0.2–1.0)
pH, UA: 5.5 (ref 5.0–7.5)

## 2022-03-28 LAB — MICROSCOPIC EXAMINATION
Bacteria, UA: NONE SEEN
Epithelial Cells (non renal): NONE SEEN /hpf (ref 0–10)
Mucus, UA: NONE SEEN
RBC, Urine: NONE SEEN /hpf (ref 0–2)
Renal Epithel, UA: NONE SEEN /hpf
WBC, UA: NONE SEEN /hpf (ref 0–5)

## 2022-03-28 LAB — CYTOLOGY, URINE

## 2022-04-01 ENCOUNTER — Telehealth: Payer: Self-pay

## 2022-04-01 DIAGNOSIS — Z8551 Personal history of malignant neoplasm of bladder: Secondary | ICD-10-CM

## 2022-04-01 NOTE — Telephone Encounter (Signed)
-----   Message from Irine Seal, MD sent at 03/31/2022  9:23 AM EDT ----- He has some atypical cells on the cytology from his last visit.  This is an equivocal finding, but he hasn't had any upper tract studies of the kidneys and ureters since he went to the OR in 12/21.  Since the cystoscopy was stable without obvious recurrence, I would like to get a CT hematuria study done on him to make sure there is nothing going on above the bladder.      ----- Message ----- From: Dorisann Frames, RN Sent: 03/28/2022   3:37 PM EDT To: Irine Seal, MD  Please review

## 2022-04-01 NOTE — Telephone Encounter (Signed)
My chart message sent of results. CT ordered

## 2022-04-18 ENCOUNTER — Ambulatory Visit (HOSPITAL_COMMUNITY)
Admission: RE | Admit: 2022-04-18 | Discharge: 2022-04-18 | Disposition: A | Discharge status: Home / Self Care | Payer: Medicare Other | Source: Ambulatory Visit | Attending: Urology | Admitting: Urology

## 2022-04-18 DIAGNOSIS — N281 Cyst of kidney, acquired: Secondary | ICD-10-CM | POA: Diagnosis not present

## 2022-04-18 DIAGNOSIS — K429 Umbilical hernia without obstruction or gangrene: Secondary | ICD-10-CM | POA: Diagnosis not present

## 2022-04-18 DIAGNOSIS — Z8551 Personal history of malignant neoplasm of bladder: Secondary | ICD-10-CM | POA: Insufficient documentation

## 2022-04-18 DIAGNOSIS — D869 Sarcoidosis, unspecified: Secondary | ICD-10-CM | POA: Diagnosis not present

## 2022-04-18 DIAGNOSIS — R350 Frequency of micturition: Secondary | ICD-10-CM | POA: Diagnosis not present

## 2022-04-18 LAB — POCT I-STAT CREATININE: Creatinine, Ser: 1.3 mg/dL — ABNORMAL HIGH (ref 0.61–1.24)

## 2022-04-18 MED ORDER — IOHEXOL 300 MG/ML  SOLN
100.0000 mL | Freq: Once | INTRAMUSCULAR | Status: AC | PRN
Start: 2022-04-18 — End: 2022-04-18
  Administered 2022-04-18: 100 mL via INTRAVENOUS

## 2022-04-22 ENCOUNTER — Telehealth: Payer: Self-pay

## 2022-04-22 NOTE — Telephone Encounter (Signed)
-----   Message from Irine Seal, MD sent at 04/21/2022 12:04 PM EDT ----- There is no evidence of any urinary abnormalities.   He has some findings in the chest that are suggestive of sarcoidosis which is a benign inflammatory condition.  I don't see it in his history.  If he is followed by someone for this, we need to forward the results to them.  If not, we need to refer him to pulmonary.  ----- Message ----- From: Audie Box, CMA Sent: 04/21/2022   9:33 AM EDT To: Irine Seal, MD  Please review, apt 10/19

## 2022-04-22 NOTE — Telephone Encounter (Signed)
FYI I informed patient of results, he is already seeing a pulmonologist for this condition.  He will f/u with you as scheduled.

## 2022-05-25 NOTE — Progress Notes (Signed)
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 No new co's.    Dyspnea:  Ex x treadmill x 1.5 mph on grade ? 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   Dyspnea:  61 m s stopping stair master  Cough: not  Sleeping: lie flat one pillow  SABA use: none  02: none  Covid status: 3 total shots /never had the infection  Rec No change rx/ f/u yearly    05/26/2022  yearly  f/u ov/[place] office/Yecenia Dalgleish re: MPNs / cough /nasal congestion q noct x years  Chief Complaint  Patient presents with   Follow-up    Patient doing well since last ov no new concerns   Dyspnea:  no  doe doing step master x 30-40 min  Cough: dry / sporadic no obvious triggers other than smells  Sleeping: nasal strips helps, sometimes benadryl  SABA use: none  02: none      No obvious day to day or daytime variability or assoc excess/ purulent sputum or mucus plugs or hemoptysis or cp or chest tightness, subjective wheeze or overt sinus or hb symptoms.   Sleepy  without nocturnal  or early am exacerbation  of respiratory  c/o's or need for noct saba. Also denies any obvious fluctuation of symptoms with weather or environmental changes or other aggravating or alleviating factors except as outlined above   No unusual exposure hx or h/o childhood pna/ asthma or knowledge of premature birth.  Current Allergies, Complete Past Medical History, Past Surgical History, Family History, and Social History were reviewed in [employer] record.  ROS  The following are not active complaints unless bolded Hoarseness, sore throat, dysphagia, dental problems, itching, sneezing,  nasal congestion or discharge of excess mucus or purulent secretions, ear ache,   fever, chills, sweats, unintended wt loss or wt gain, classically pleuritic or  exertional cp,  orthopnea pnd or arm/hand swelling  or leg swelling, presyncope, palpitations, abdominal pain, anorexia, nausea, vomiting, diarrhea  or change in bowel habits or change in bladder habits, change in stools or change in urine, dysuria, hematuria,  rash, arthralgias, visual complaints, headache, numbness, weakness or ataxia or problems with walking or coordination,  change in mood or  memory.        Current Meds  Medication Sig   b complex vitamins tablet Take 1 tablet by mouth daily.   Cholecalciferol (VITAMIN D3) 2000 UNITS TABS Take 2,000 Units by mouth daily.   Coenzyme Q10 200 MG capsule Take 200 mg by mouth daily.   gabapentin (NEURONTIN) 100 MG capsule Take 100 mg by mouth at bedtime as needed.   levothyroxine (SYNTHROID) 88 MCG  tablet Take 88 mcg by mouth daily before breakfast.   loratadine (CLARITIN) 10 MG tablet Take 10 mg by mouth daily.   losartan (COZAAR) 50 MG tablet Take 50 mg by mouth daily.   meloxicam (MOBIC) 15 MG tablet Take 15 mg by mouth daily as needed for pain.    Omega-3 1400 MG CAPS    pantoprazole (PROTONIX) 40 MG tablet Take 40 mg by mouth daily.   traMADol (ULTRAM) 50 MG tablet Take 50 mg by mouth every 6 (six) hours as needed for moderate pain.    Turmeric Curcumin 500 MG CAPS    ZOCOR 40 MG tablet TAKE ONE TABLET BY MOUTH IN THE EVENING.               Past Medical History:  Diagnosis Date   Arthritis    BPH (benign prostatic hyperplasia) 08/1989   GERD (gastroesophageal reflux disease) 11/29/1998   Hyperlipidemia 08/1997   Hypertension    Hypothyroidism 1983   Lesion of bladder    Spermatocele    per Dr. Jeffie Pollock with URO       Objective:     Wts  05/26/2022       185  05/23/2021       182   10/25/20 189 lb 6.4 oz (85.9 kg)  09/06/20 188 lb (85.3 kg)  08/09/20 187 lb (84.8 kg)    Vital signs reviewed  05/26/2022  - Note at rest 02 sats  95% on RA   General appearance:    amb pleasant healthy appearing wm nad   HEENT: nl dentition, turbinates mild edema/ non-specific, no polyps or crusting    NECK :  without JVD/Nodes/TM/ nl carotid upstrokes bilaterally   LUNGS: no acc muscle use,  Nl contour chest which is clear to A and P bilaterally without cough on insp or exp maneuvers   CV:  RRR  no s3 or murmur or increase in P2, and no edema   ABD:  soft and nontender with nl inspiratory excursion in the supine position. No bruits or organomegaly appreciated, bowel sounds nl  MS:  Nl gait/ ext warm without deformities, calf tenderness, cyanosis or clubbing No obvious joint restrictions   SKIN: warm and dry without lesions    NEURO:  alert, approp, nl sensorium with  no motor or cerebellar deficits apparent.           Assessment

## 2022-05-26 ENCOUNTER — Encounter: Payer: Self-pay | Admitting: Internal Medicine

## 2022-05-26 ENCOUNTER — Ambulatory Visit: Payer: Medicare Other | Admitting: Internal Medicine

## 2022-05-26 VITALS — BP 136/84 | HR 62 | Temp 97.9°F | Ht 70.0 in | Wt 185.2 lb

## 2022-05-26 DIAGNOSIS — Z23 Encounter for immunization: Secondary | ICD-10-CM | POA: Diagnosis not present

## 2022-05-26 DIAGNOSIS — R0609 Other forms of dyspnea: Secondary | ICD-10-CM | POA: Diagnosis not present

## 2022-05-26 DIAGNOSIS — R918 Other nonspecific abnormal finding of lung field: Secondary | ICD-10-CM

## 2022-05-26 DIAGNOSIS — J310 Chronic rhinitis: Secondary | ICD-10-CM | POA: Diagnosis not present

## 2022-05-26 NOTE — Assessment & Plan Note (Addendum)
Referred to ENT 05/26/2022 >>> r/o sarcoid involvement   Discussed in detail all the  indications, usual  risks and alternatives  relative to the benefits with patient who agrees to proceed with w/u as outlined.            Each maintenance medication was reviewed in detail including emphasizing most importantly the difference between maintenance and prns and under what circumstances the prns are to be triggered using an action plan format where appropriate.  Total time for H and P, chart review, counseling, ) and generating customized AVS unique to this office visit / same day charting = 32 min yearly f/u

## 2022-05-26 NOTE — Patient Instructions (Addendum)
My office will be contacting you by phone for referral to ENT in [place]   - if you don't hear back from my office within one week please call us back or notify us thru MyChart and we'll address it right away.    Please schedule a follow up visit in 3 months but call sooner if needed with cxr on return

## 2022-05-26 NOTE — Assessment & Plan Note (Signed)
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 slt progression   Continues to be asymptomatic x for non-specific rhinitis symptoms which may be sarcoid or allergy related > has already seen allergy s benefit so referred to ENT

## 2022-05-26 NOTE — Assessment & Plan Note (Signed)
Onset 2014 with cpst 08/31/13  88% of nl with no cardiopulmonary limitation  - 04/10/2020 no change ex tol since 2014   Not limited by breathing from desired activities including > 30 m stair master

## 2022-06-05 ENCOUNTER — Other Ambulatory Visit: Payer: Medicare Other | Admitting: Urology

## 2022-06-19 ENCOUNTER — Other Ambulatory Visit: Payer: Medicare Other | Admitting: Urology

## 2022-07-02 DIAGNOSIS — E118 Type 2 diabetes mellitus with unspecified complications: Secondary | ICD-10-CM | POA: Diagnosis not present

## 2022-07-02 DIAGNOSIS — E782 Mixed hyperlipidemia: Secondary | ICD-10-CM | POA: Diagnosis not present

## 2022-07-02 DIAGNOSIS — E039 Hypothyroidism, unspecified: Secondary | ICD-10-CM | POA: Diagnosis not present

## 2022-07-07 DIAGNOSIS — N182 Chronic kidney disease, stage 2 (mild): Secondary | ICD-10-CM | POA: Diagnosis not present

## 2022-07-07 DIAGNOSIS — K219 Gastro-esophageal reflux disease without esophagitis: Secondary | ICD-10-CM | POA: Diagnosis not present

## 2022-07-07 DIAGNOSIS — J309 Allergic rhinitis, unspecified: Secondary | ICD-10-CM | POA: Diagnosis not present

## 2022-07-07 DIAGNOSIS — K7689 Other specified diseases of liver: Secondary | ICD-10-CM | POA: Diagnosis not present

## 2022-07-17 ENCOUNTER — Ambulatory Visit: Payer: Medicare Other | Admitting: Urology

## 2022-07-17 VITALS — BP 145/75 | HR 83

## 2022-07-17 DIAGNOSIS — Z8551 Personal history of malignant neoplasm of bladder: Secondary | ICD-10-CM | POA: Diagnosis not present

## 2022-07-17 MED ORDER — CIPROFLOXACIN HCL 500 MG PO TABS
500.0000 mg | ORAL_TABLET | Freq: Once | ORAL | Status: AC
  Administered 2022-07-17: 500 mg via ORAL

## 2022-07-17 NOTE — Progress Notes (Signed)
Subjective:  1. History of bladder cancer     07/17/22: Isaac Guerrero returns today in f/u for the history below.  He had a CT hematuria study in 8/23 that showed no urothelial or renal abnormalities.   He has no new complaints and his urine is clear.   7/272/3: Isaac Guerrero returns today for his history of CIS of the bladder.  He has had no hematuria.  His IPSS is 5 with nocturia x 1.  He had severe bladder inflammation after the BCG so maintenance was not felt to be appropriate.  He has some issues with ED but is not interested in therapy.     12/26/21: Isaac Guerrero returns today for surveillance cystoscopy for his history of CIS that was last detected on 08/09/20.  He has been treated with BCG with a second induction course completed in 5/22. He had persistent irritative symptoms that eventually resolved.  He last had a biopsy on 03/05/21 and that just showed inflammation.  He had a MRSA UTI in the past as well.  His UA is clear.  He is voiding well with an IPSS of 6 with nocturia x 1 and some frequency.  He has issues with ED but has a headaches with viagra and cialis.  He has had some recent right testicular pain that improved with gabapentin and has none in a month.      IPSS     Row Name 07/17/22 1400         International Prostate Symptom Score   How often have you had the sensation of not emptying your bladder? Not at All     How often have you had to urinate less than every two hours? Less than half the time     How often have you found you stopped and started again several times when you urinated? Not at All     How often have you found it difficult to postpone urination? Less than 1 in 5 times     How often have you had a weak urinary stream? Not at All     How often have you had to strain to start urination? Not at All     How many times did you typically get up at night to urinate? 1 Time     Total IPSS Score 4       Quality of Life due to urinary symptoms   If you were to spend the rest of your life  with your urinary condition just the way it is now how would you feel about that? Pleased                ROS:  ROS:  A complete review of systems was performed.  All systems are negative except for pertinent findings as noted.   Review of Systems  All other systems reviewed and are negative.   Allergies  Allergen Reactions   Cephalexin Rash   Penicillins Rash   Sulfonamide Derivatives Rash    Outpatient Encounter Medications as of 07/17/2022  Medication Sig   b complex vitamins tablet Take 1 tablet by mouth daily.   Cholecalciferol (VITAMIN D3) 2000 UNITS TABS Take 2,000 Units by mouth daily.   Coenzyme Q10 200 MG capsule Take 200 mg by mouth daily.   gabapentin (NEURONTIN) 100 MG capsule Take 100 mg by mouth at bedtime as needed.   levothyroxine (SYNTHROID) 88 MCG tablet Take 88 mcg by mouth daily before breakfast.   loratadine (CLARITIN) 10 MG tablet Take 10  mg by mouth daily.   losartan (COZAAR) 50 MG tablet Take 50 mg by mouth daily.   meloxicam (MOBIC) 15 MG tablet Take 15 mg by mouth daily as needed for pain.    Omega-3 1400 MG CAPS    pantoprazole (PROTONIX) 40 MG tablet Take 40 mg by mouth daily.   traMADol (ULTRAM) 50 MG tablet Take 50 mg by mouth every 6 (six) hours as needed for moderate pain.    Turmeric Curcumin 500 MG CAPS    ZOCOR 40 MG tablet TAKE ONE TABLET BY MOUTH IN THE EVENING.   [EXPIRED] ciprofloxacin (CIPRO) tablet 500 mg    No facility-administered encounter medications on file as of 07/17/2022.    Past Medical History:  Diagnosis Date   Anemia    Arthritis    oa   BPH (benign prostatic hyperplasia) 08/1989   GERD (gastroesophageal reflux disease) 11/29/1998   Hyperlipidemia 08/1997   Hypertension    Hypothyroidism 1983   Lesion of bladder    Lesion of bladder 02/28/2021   Pre-diabetes    Pulmonary nodules    Spermatocele    per Dr. Jeffie Pollock with URO   Urinary frequency 02/28/2021   and buring with urination    Past Surgical  History:  Procedure Laterality Date   CATARACT EXTRACTION     left eye 03-2003 and right fall 2021   COLONOSCOPY  08/10/2009   polyp in ascending colon otherwise normal   COLONOSCOPY WITH ESOPHAGOGASTRODUODENOSCOPY (EGD) N/A 10/28/2013   Dr. Rourk:Schatzki's ring - not manipulated because no dysphagia currently. Small hiatal hernia.  Gastric and duodenal erosions of uncertain significance-status post biopsy. path with benign findings, no villous atrophy, no H.pylori/TCS:Colonic diverticulosis   CYSTOSCOPY  08/1989   normal   CYSTOSCOPY WITH BIOPSY N/A 02/07/2020   Procedure: CYSTOSCOPY WITH BLADDER AND PROSTATIS URETHRAL  BIOPSY WIHT FULGURATION AND INSTILL GEMCITABINE;  Surgeon: Isaac Seal, MD;  Location: [facility];  Service: Urology;  Laterality: N/A;   CYSTOSCOPY WITH BIOPSY Bilateral 08/09/2020   Procedure: CYSTOSCOPY WITH BLADDER BIOPSYAND FULGURATION BILATERAL RETROGRADE S;  Surgeon: Isaac Seal, MD;  Location: [facility];  Service: Urology;  Laterality: Bilateral;   CYSTOSCOPY WITH BIOPSY N/A 03/05/2021   Procedure: CYSTOSCOPY WITH BIOPSY FULGURATION;  Surgeon: Isaac Seal, MD;  Location[facility];  Service: Urology;  Laterality: N/A;   ESOPHAGOGASTRODUODENOSCOPY  10/1999   distal esoph. stricture; sliding H. H.   LASIK  04/12/2003   right eye   lumbar microdisectomy  2003   Dr. Hal Neer    Social History   Socioeconomic History   Marital status: Married    Spouse name: Not on file   Number of children: 1   Years of education: Not on file   Highest education level: Not on file  Occupational History   Occupation: Music therapist: [employer]    Comment: Music therapist  Tobacco Use   Smoking status: Never   Smokeless tobacco: Never  Vaping Use   Vaping Use: Never used  Substance and Sexual Activity   Alcohol use: No   Drug use: No   Sexual activity: Yes  Other Topics Concern   Not on file  Social History Narrative    Married and lives with wife   Social Determinants of Health   Financial Resource Strain: Not on file  Food Insecurity: Not on file  Transportation Needs: Not on file  Physical Activity: Not on file  Stress: Not on file  Social Connections:  Not on file  Intimate Partner Violence: Not on file    Family History  Problem Relation Age of Onset   Hypertension Mother    Hyperlipidemia Mother    Diabetes Mother 38   Heart disease Father        CABG X 4  12/2001   Hyperlipidemia Father    Hypertension Father    Heart disease Brother        CABG X 1   Heart disease Paternal Uncle        MI   Cancer Maternal Grandfather        colon   Diabetes Maternal Uncle    Arthritis Maternal Uncle    Stroke Paternal Grandfather    Allergic rhinitis Neg Hx    Angioedema Neg Hx    Asthma Neg Hx    Atopy Neg Hx    Eczema Neg Hx    Immunodeficiency Neg Hx    Urticaria Neg Hx        Objective: Vitals:   07/17/22 1359  BP: (!) 145/75  Pulse: 83     Physical Exam  Lab Results:  PSA PSA  Date Value Ref Range Status  08/05/2011 0.98 0.10 - 4.00 ng/mL Final  07/22/2010 0.85 0.10 - 4.00 ng/mL Final  03/26/2009 1.03 0.10 - 4.00 ng/mL Final   No results found for: "TESTOSTERONE"  UA is clear.   Studies/Results: Results for orders placed or performed in visit on 07/17/22 (from the past 24 hour(s))  Urinalysis, Routine w reflex microscopic     Status: None   Collection Time: 07/17/22  2:15 PM  Result Value Ref Range   Specific Gravity, UA 1.025 1.005 - 1.030   pH, UA 6.0 5.0 - 7.5   Color, UA Yellow Yellow   Appearance Ur Clear Clear   Leukocytes,UA Negative Negative   Protein,UA Negative Negative/Trace   Glucose, UA Negative Negative   Ketones, UA Negative Negative   RBC, UA Negative Negative   Bilirubin, UA Negative Negative   Urobilinogen, Ur 0.2 0.2 - 1.0 mg/dL   Nitrite, UA Negative Negative   Microscopic Examination Comment    Narrative   Performed at:  [facility] [address], [place], [place]  [id number] Lab Director: [place], Phone:  [phone]    No results found. CLINICAL DATA:  Bladder cancer diagnosed in December 2021. Recent urinary frequency.   * Tracking Code: BO *   EXAM: CT ABDOMEN AND PELVIS WITHOUT AND WITH CONTRAST   TECHNIQUE: Multidetector CT imaging of the abdomen and pelvis was performed following the standard protocol before and following the bolus administration of intravenous contrast.   RADIATION DOSE REDUCTION: This exam was performed according to the departmental dose-optimization program which includes automated exposure control, adjustment of the mA and/or kV according to patient size and/or use of iterative reconstruction technique.   CONTRAST:  165m OMNIPAQUE IOHEXOL 300 MG/ML  SOLN   COMPARISON:  01/16/2020   FINDINGS: Lower chest: Calcified lymph nodes compatible with known sarcoidosis including a 1.5 cm in short axis distal paraesophageal lymph node on image 13 series 8, formerly the same. Progressive clustered peribronchovascular pulmonary nodularity in the right middle lobe with some associated right middle lobe atelectasis. Subpleural nodularity noted in both lower lobes. These are also compatible sarcoidosis.   Circumflex, left anterior descending, and right coronary atherosclerosis. Aortic valve calcification.   Hepatobiliary: Unremarkable   Pancreas: Unremarkable   Spleen: Unremarkable   Adrenals/Urinary Tract: Both adrenal glands  appear normal.   9.28 by 8.7 cm Bosniak category 1 cyst of the right mid to lower kidney.   2.4 by 1.9 cm left mid kidney Bosniak category 1 cyst.   Exophytic right kidney lower pole 1.8 by 1.5 cm Bosniak category 2 cyst, benign. The cystic lesions do not require any further workup.   No filling defect or abnormal enhancement along the urothelium to indicate recurrent urothelial cancer.   Stomach/Bowel: Unremarkable  aside from a few scattered sigmoid colon diverticula.   Vascular/Lymphatic: Atherosclerosis is present, including aortoiliac atherosclerotic disease. There is some atheromatous plaque proximally in the SMA and celiac trunk along with plaque noted further distally in the celiac trunk is well. No overt occlusion. No pathologic adenopathy in the abdomen.   Reproductive: Unremarkable   Other: No supplemental non-categorized findings.   Musculoskeletal: Lumbar spondylosis and degenerative disc disease. Small umbilical hernia contains adipose tissue.   IMPRESSION: 1. No current findings of urothelial tumor. 2. Progressive nodularity in the lung bases in a pattern characteristic for sarcoidosis. Calcified lymph nodes in the lower thorax likewise characteristic of sarcoidosis. 3. Other imaging findings of potential clinical significance: Aortic Atherosclerosis (ICD10-I70.0). Coronary and systemic atherosclerosis. Benign renal cysts requiring no further workup. Lumbar spondylosis and degenerative disc disease.     Electronically Signed   By: Van Clines M.D.   On: 04/21/2022 08:49     Procedure:  Flexible cystoscopy.  He was prepped with betadine and 2% lidocaine jelly.  Cipro '500mg'$  given po.  The scope was passed and the urethra was normal with the exception of a mild membranous stricture.  There was some blanching of the external sphincter but the scope passed.  He has bilobar hyperplasia with some coaptation.  The bladder wall has mild/mod trabeculation.  There is a right posterior wall scar.  There is a persistent lesion on the left anterior and lateral wall with some inflammatory exudate and persistent erythema. The lesion is about 0.5 x 1.5 cm along a trabecular band.  There is no apparent change.  This was inflammatory on prior biopsy.  The UO's are normal.      Assessment & Plan: Hx of  CIS s/p BCG induction x 2.   The most recent biopsies show no recurrent CIS.   He  is doing well without significant voiding symptoms but still has some inflammatory changes on the bladder wall and some microhematuria.  Cytology today.   He will return in 3 months for cystoscopy.  I don't think further BCG would be appropriate.    Meds ordered this encounter  Medications   ciprofloxacin (CIPRO) tablet 500 mg       Orders Placed This Encounter  Procedures   Urinalysis, Routine w reflex microscopic     Return in about 3 months (around 10/17/2022) for cystoscopy on return..   CC: Celene Squibb, MD      Isaac Guerrero 07/18/2022 Patient ID: Isaac Guerrero, male   DOB: [birth year], 66 y.o.   MRN: [id number]

## 2022-07-18 LAB — URINALYSIS, ROUTINE W REFLEX MICROSCOPIC
Bilirubin, UA: NEGATIVE
Glucose, UA: NEGATIVE
Ketones, UA: NEGATIVE
Leukocytes,UA: NEGATIVE
Nitrite, UA: NEGATIVE
Protein,UA: NEGATIVE
RBC, UA: NEGATIVE
Specific Gravity, UA: 1.025 (ref 1.005–1.030)
Urobilinogen, Ur: 0.2 mg/dL (ref 0.2–1.0)
pH, UA: 6 (ref 5.0–7.5)

## 2022-08-05 DIAGNOSIS — J310 Chronic rhinitis: Secondary | ICD-10-CM | POA: Diagnosis not present

## 2022-08-20 DIAGNOSIS — E119 Type 2 diabetes mellitus without complications: Secondary | ICD-10-CM | POA: Diagnosis not present

## 2022-08-26 ENCOUNTER — Ambulatory Visit: Payer: Medicare Other | Admitting: Internal Medicine

## 2022-08-26 ENCOUNTER — Ambulatory Visit (HOSPITAL_COMMUNITY)
Admission: RE | Admit: 2022-08-26 | Discharge: 2022-08-26 | Disposition: A | Discharge status: Home / Self Care | Payer: Medicare Other | Source: Ambulatory Visit | Attending: Internal Medicine | Admitting: Internal Medicine

## 2022-08-26 ENCOUNTER — Encounter: Payer: Self-pay | Admitting: Internal Medicine

## 2022-08-26 VITALS — BP 134/78 | HR 66 | Temp 98.2°F | Ht 70.0 in | Wt 189.6 lb

## 2022-08-26 DIAGNOSIS — R918 Other nonspecific abnormal finding of lung field: Secondary | ICD-10-CM

## 2022-08-26 DIAGNOSIS — R911 Solitary pulmonary nodule: Secondary | ICD-10-CM | POA: Diagnosis not present

## 2022-08-26 DIAGNOSIS — J189 Pneumonia, unspecified organism: Secondary | ICD-10-CM | POA: Diagnosis not present

## 2022-08-26 NOTE — Progress Notes (Signed)
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 No new co's.    Dyspnea:  Ex x treadmill x 1.5 mph on grade ? % x 15 min daily s sob  Cough: none Sleeping: no resp flat / on side  SABA use: no 02: no Covid status: vax x 3  Lung cancer screening: na  Rec No change in recommendations but try to build up to 30 min of exercise  lab esr 25/ ACE  64 x-ray cxr s change    05/23/2021  f/u ov/[place] office/Boomer Winders re: MPNs presumed sarcoid  Chief Complaint  Patient presents with   Follow-up    No changes in SOB or cough since last visit.   Dyspnea:  13 m s stopping stair master  Cough: not  Sleeping: lie flat one pillow  SABA use: none  02: none  Covid status: 3 total shots /never had the infection  Rec No change rx/ f/u yearly    05/26/2022  yearly  f/u ov/[place] office/Maybelle Depaoli re: MPNs / cough /nasal congestion q noct x years  Chief Complaint  Patient presents with    Follow-up    Patient doing well since last ov no new concerns   Dyspnea:  no doe doing step master x 30-40 min  Cough: dry / sporadic no obvious triggers other than smells  Sleeping: nasal strips helps, sometimes benadryl  SABA use: none  02: none  Rec referral to ENT in [place]  >>> 08/05/22 [place] eval:  Findings: Nasal septal deviation and spurring. No purulent mucus. No inflammatory or granulomatous changes.     08/26/2022  f/u ov/[place] office/Jacquelene Kopecky re: doe/ mpns maint on nothing    Chief Complaint  Patient presents with   Follow-up    Breathing about the same since last ov. No changes  Did see ENT   Dyspnea:  stairmaster 3 x weekly x 30 min Cough: none  Sleeping: ok  not needing benadryl  SABA use: none  02: none Covid status: max vax    No obvious day to day or daytime variability or assoc excess/ purulent sputum or mucus plugs or hemoptysis or cp or chest tightness, subjective wheeze or overt sinus or hb symptoms.   Sleeping  without nocturnal  or early am exacerbation  of respiratory  c/o's or need for noct saba. Also denies any obvious fluctuation of symptoms with weather or environmental changes or other aggravating or alleviating factors except as outlined above   No unusual exposure hx or h/o childhood pna/ asthma or knowledge of premature birth.  Current Allergies, Complete Past Medical History, Past Surgical History, Family History, and Social History were reviewed in [employer] record.  ROS  The following are not active complaints unless bolded Hoarseness, sore throat, dysphagia, dental problems, itching, sneezing,  nasal congestion or discharge of excess mucus or purulent secretions, ear ache,   fever, chills, sweats, unintended wt loss or wt gain, classically pleuritic or exertional cp,  orthopnea pnd or arm/hand swelling  or leg swelling, presyncope, palpitations, abdominal pain, anorexia, nausea, vomiting, diarrhea  or change  in bowel habits or change in bladder habits, change in stools or change in urine, dysuria, hematuria,  rash, arthralgias, visual complaints, headache, numbness, weakness or ataxia or problems with walking or coordination,  change in mood or  memory.        Current Meds  Medication Sig   b complex vitamins tablet Take 1 tablet by mouth daily.   Cholecalciferol (VITAMIN D3) 2000 UNITS TABS Take 2,000 Units by mouth daily.   Coenzyme Q10 200 MG capsule Take 200 mg by mouth daily.   gabapentin (NEURONTIN) 100 MG capsule Take 100 mg by mouth at bedtime as needed.   levothyroxine (SYNTHROID) 88 MCG tablet Take 88 mcg by mouth daily before breakfast.   loratadine (CLARITIN) 10 MG tablet Take 10 mg by mouth daily.   losartan (COZAAR) 50 MG tablet Take 50 mg by mouth daily.   meloxicam (MOBIC) 15 MG tablet Take 15 mg by mouth daily as needed for pain.    Omega-3 1400 MG CAPS    pantoprazole (PROTONIX) 40 MG tablet Take 40 mg by mouth daily.   traMADol (ULTRAM) 50 MG tablet Take 50 mg by mouth every 6 (six) hours as needed for moderate pain.    Turmeric Curcumin 500 MG CAPS    ZOCOR 40 MG tablet TAKE ONE TABLET BY MOUTH IN THE EVENING.                Past Medical History:  Diagnosis Date   Arthritis    BPH (benign prostatic hyperplasia) 08/1989   GERD (gastroesophageal reflux disease) 11/29/1998   Hyperlipidemia 08/1997   Hypertension    Hypothyroidism 1983   Lesion of bladder    Spermatocele    per Dr. Jeffie Pollock with URO       Objective:     Wts  08/26/2022     189  05/26/2022       185  05/23/2021       182   10/25/20 189 lb 6.4 oz (85.9 kg)  09/06/20 188 lb (85.3 kg)  08/09/20 187 lb (84.8 kg)    Vital signs reviewed  08/26/2022  - Note at rest 02 sats  96% on RA   General appearance:    amb wm nad   HEENT : Oropharynx  clear        NECK :  without  apparent JVD/ palpable Nodes/TM    LUNGS: no acc muscle use,  Nl contour chest which is clear to A and P bilaterally  without cough on insp or exp maneuvers   CV:  RRR  no s3 or murmur or increase in P2, and no edema   ABD:  soft and nontender  with nl inspiratory excursion in the supine position. No bruits or organomegaly appreciated   MS:  Nl gait/ ext warm without deformities Or obvious joint restrictions  calf tenderness, cyanosis or clubbing    SKIN: warm and dry without lesions    NEURO:  alert, approp, nl sensorium with  no motor or cerebellar deficits apparent.     CXR PA and Lateral:   08/26/2022 :    I personally reviewed images and agree with radiology impression as follows:    Bilateral pulmonary nodules identified unchanged compared to prior exam. Opacity of the medial right lung base is stable compared to prior exam of February 16, 2020 chest CT. These are consistent with patient's history of sarcoidosis. No focal pneumonia is noted.    Assessment

## 2022-08-26 NOTE — Patient Instructions (Signed)
If your nasal symptoms worsen I recommend you see Dr Ernst Bowler again to regroup re best options  Make sure you check your oxygen saturation at your highest level of activity(NOT after you stop)  to be sure it stays over 90% and keep track of it at least once a week, more often if breathing getting worse, and let me know if losing ground. (Collect the dots to connect the dots approach)     Please remember to go to the  x-ray department  @  [facility] for your tests - we will call you with the results when they are available      Please schedule a follow up visit in 12 months but call sooner if needed  with all medications /inhalers/ solutions in hand so we can verify exactly what you are taking. This includes all medications from all doctors and over the counters

## 2022-08-26 NOTE — Assessment & Plan Note (Signed)
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 slt progression   No radiographic or clinical evidence of progression with most of his symptoms being upper airway in nature but neg ent eval noted so referred this back to allergy prn   F/u here can be yearly,  call sooner if needed          Each maintenance medication was reviewed in detail including emphasizing most importantly the difference between maintenance and prns and under what circumstances the prns are to be triggered using an action plan format where appropriate.  Total time for H and P, chart review, counseling, and generating customized AVS unique to this office visit / same day charting = 25 min

## 2022-10-23 ENCOUNTER — Other Ambulatory Visit: Payer: Medicare Other | Admitting: Urology

## 2022-10-30 ENCOUNTER — Other Ambulatory Visit: Payer: Medicare Other | Admitting: Urology

## 2022-10-30 DIAGNOSIS — U071 COVID-19: Secondary | ICD-10-CM | POA: Diagnosis not present

## 2022-10-30 DIAGNOSIS — R070 Pain in throat: Secondary | ICD-10-CM | POA: Diagnosis not present

## 2022-11-04 DIAGNOSIS — E039 Hypothyroidism, unspecified: Secondary | ICD-10-CM | POA: Diagnosis not present

## 2022-11-04 DIAGNOSIS — E782 Mixed hyperlipidemia: Secondary | ICD-10-CM | POA: Diagnosis not present

## 2022-11-04 DIAGNOSIS — E1122 Type 2 diabetes mellitus with diabetic chronic kidney disease: Secondary | ICD-10-CM | POA: Diagnosis not present

## 2022-11-05 DIAGNOSIS — K080 Exfoliation of teeth due to systemic causes: Secondary | ICD-10-CM | POA: Diagnosis not present

## 2022-11-07 DIAGNOSIS — N182 Chronic kidney disease, stage 2 (mild): Secondary | ICD-10-CM | POA: Diagnosis not present

## 2022-11-07 DIAGNOSIS — K219 Gastro-esophageal reflux disease without esophagitis: Secondary | ICD-10-CM | POA: Diagnosis not present

## 2022-11-07 DIAGNOSIS — E782 Mixed hyperlipidemia: Secondary | ICD-10-CM | POA: Diagnosis not present

## 2022-11-07 DIAGNOSIS — E1122 Type 2 diabetes mellitus with diabetic chronic kidney disease: Secondary | ICD-10-CM | POA: Diagnosis not present

## 2022-12-11 ENCOUNTER — Encounter: Payer: Self-pay | Admitting: Urology

## 2022-12-11 ENCOUNTER — Ambulatory Visit: Payer: Medicare Other | Admitting: Urology

## 2022-12-11 VITALS — BP 156/90 | HR 76 | Ht 70.0 in | Wt 189.0 lb

## 2022-12-11 DIAGNOSIS — Z8551 Personal history of malignant neoplasm of bladder: Secondary | ICD-10-CM | POA: Diagnosis not present

## 2022-12-11 LAB — URINALYSIS, ROUTINE W REFLEX MICROSCOPIC
Bilirubin, UA: NEGATIVE
Glucose, UA: NEGATIVE
Ketones, UA: NEGATIVE
Leukocytes,UA: NEGATIVE
Nitrite, UA: NEGATIVE
Protein,UA: NEGATIVE
RBC, UA: NEGATIVE
Specific Gravity, UA: 1.02 (ref 1.005–1.030)
Urobilinogen, Ur: 0.2 mg/dL (ref 0.2–1.0)
pH, UA: 5.5 (ref 5.0–7.5)

## 2022-12-11 MED ORDER — CIPROFLOXACIN HCL 500 MG PO TABS
500.0000 mg | ORAL_TABLET | Freq: Once | ORAL | Status: AC
  Administered 2022-12-11: 500 mg via ORAL

## 2022-12-11 NOTE — Progress Notes (Signed)
Subjective:  1. History of bladder cancer     12/11/22: Isaac Guerrero retuns today for surveillance cystoscopy for his history of CIS of the bladder with the treatment history below.   He had COVID in February but has recovered from. He is voiding well with an IPSS of 4 and nocturia x 1.  He has had no hematuria.    07/17/22: Isaac Guerrero returns today in f/u for the history below.  He had a CT hematuria study in 8/23 that showed no urothelial or renal abnormalities.   He has no new complaints and his urine is clear.   7/272/3: Isaac Guerrero returns today for his history of CIS of the bladder.  He has had no hematuria.  His IPSS is 5 with nocturia x 1.  He had severe bladder inflammation after the BCG so maintenance was not felt to be appropriate.  He has some issues with ED but is not interested in therapy.     12/26/21: Isaac Guerrero returns today for surveillance cystoscopy for his history of CIS that was last detected on 08/09/20.  He has been treated with BCG with a second induction course completed in 5/22. He had persistent irritative symptoms that eventually resolved.  He last had a biopsy on 03/05/21 and that just showed inflammation.  He had a MRSA UTI in the past as well.  His UA is clear.  He is voiding well with an IPSS of 6 with nocturia x 1 and some frequency.  He has issues with ED but has a headaches with viagra and cialis.  He has had some recent right testicular pain that improved with gabapentin and has none in a month.      IPSS     Row Name 12/11/22 1400         International Prostate Symptom Score   How often have you had the sensation of not emptying your bladder? Less than 1 in 5     How often have you had to urinate less than every two hours? Less than 1 in 5 times     How often have you found you stopped and started again several times when you urinated? Not at All     How often have you found it difficult to postpone urination? Less than 1 in 5 times     How often have you had a weak urinary stream?  Not at All     How often have you had to strain to start urination? Not at All     How many times did you typically get up at night to urinate? 1 Time     Total IPSS Score 4       Quality of Life due to urinary symptoms   If you were to spend the rest of your life with your urinary condition just the way it is now how would you feel about that? Mostly Satisfied                 ROS:  ROS:  A complete review of systems was performed.  All systems are negative except for pertinent findings as noted.   Review of Systems  All other systems reviewed and are negative.   Allergies  Allergen Reactions   Cephalexin Rash   Penicillins Rash   Sulfonamide Derivatives Rash    Outpatient Encounter Medications as of 12/11/2022  Medication Sig   b complex vitamins tablet Take 1 tablet by mouth daily.   Cholecalciferol (VITAMIN D3) 2000 UNITS TABS Take  2,000 Units by mouth daily.   Coenzyme Q10 200 MG capsule Take 200 mg by mouth daily.   gabapentin (NEURONTIN) 100 MG capsule Take 100 mg by mouth at bedtime as needed.   levothyroxine (SYNTHROID) 88 MCG tablet Take 88 mcg by mouth daily before breakfast.   loratadine (CLARITIN) 10 MG tablet Take 10 mg by mouth daily.   losartan (COZAAR) 50 MG tablet Take 50 mg by mouth daily.   meloxicam (MOBIC) 15 MG tablet Take 15 mg by mouth daily as needed for pain.    Omega-3 1400 MG CAPS    pantoprazole (PROTONIX) 40 MG tablet Take 40 mg by mouth daily.   traMADol (ULTRAM) 50 MG tablet Take 50 mg by mouth every 6 (six) hours as needed for moderate pain.    Turmeric Curcumin 500 MG CAPS    ZOCOR 40 MG tablet TAKE ONE TABLET BY MOUTH IN THE EVENING.   [EXPIRED] ciprofloxacin (CIPRO) tablet 500 mg    No facility-administered encounter medications on file as of 12/11/2022.    Past Medical History:  Diagnosis Date   Anemia    Arthritis    oa   BPH (benign prostatic hyperplasia) 08/1989   GERD (gastroesophageal reflux disease) 11/29/1998    Hyperlipidemia 08/1997   Hypertension    Hypothyroidism 1983   Lesion of bladder    Lesion of bladder 02/28/2021   Pre-diabetes    Pulmonary nodules    Spermatocele    per Dr. Annabell Howells with URO   Urinary frequency 02/28/2021   and buring with urination    Past Surgical History:  Procedure Laterality Date   CATARACT EXTRACTION     left eye 03-2003 and right fall 2021   COLONOSCOPY  08/10/2009   polyp in ascending colon otherwise normal   COLONOSCOPY WITH ESOPHAGOGASTRODUODENOSCOPY (EGD) N/A 10/28/2013   Dr. Rourk:Schatzki's ring - not manipulated because no dysphagia currently. Small hiatal hernia.  Gastric and duodenal erosions of uncertain significance-status post biopsy. path with benign findings, no villous atrophy, no H.pylori/TCS:Colonic diverticulosis   CYSTOSCOPY  08/1989   normal   CYSTOSCOPY WITH BIOPSY N/A 02/07/2020   Procedure: CYSTOSCOPY WITH BLADDER AND PROSTATIS URETHRAL  BIOPSY WIHT FULGURATION AND INSTILL GEMCITABINE;  Surgeon: Bjorn Pippin, MD;  Location: [facility];  Service: Urology;  Laterality: N/A;   CYSTOSCOPY WITH BIOPSY Bilateral 08/09/2020   Procedure: CYSTOSCOPY WITH BLADDER BIOPSYAND FULGURATION BILATERAL RETROGRADE S;  Surgeon: Bjorn Pippin, MD;  Location: [facility];  Service: Urology;  Laterality: Bilateral;   CYSTOSCOPY WITH BIOPSY N/A 03/05/2021   Procedure: CYSTOSCOPY WITH BIOPSY FULGURATION;  Surgeon: Bjorn Pippin, MD;  Location[facility];  Service: Urology;  Laterality: N/A;   ESOPHAGOGASTRODUODENOSCOPY  10/1999   distal esoph. stricture; sliding H. H.   LASIK  04/12/2003   right eye   lumbar microdisectomy  2003   Dr. 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, water  Tobacco Use   Smoking status: Never   Smokeless tobacco: Never  Vaping Use   Vaping Use: Never used  Substance and Sexual Activity   Alcohol use: No   Drug use: No   Sexual activity: Yes  Other  Topics Concern   Not on file  Social History Narrative   Married and lives with wife   Social Determinants of Health   Financial Resource Strain: Not on file  Food Insecurity: Not on file  Transportation Needs: Not on file  Physical Activity: Not on file  Stress: Not on file  Social Connections: Not on file  Intimate Partner Violence: Not on file    Family History  Problem Relation Age of Onset   Hypertension Mother    Hyperlipidemia Mother    Diabetes Mother 42   Heart disease Father        CABG X 4  12/2001   Hyperlipidemia Father    Hypertension Father    Heart disease Brother        CABG X 1   Heart disease Paternal Uncle        MI   Cancer Maternal Grandfather        colon   Diabetes Maternal Uncle    Arthritis Maternal Uncle    Stroke Paternal Grandfather    Allergic rhinitis Neg Hx    Angioedema Neg Hx    Asthma Neg Hx    Atopy Neg Hx    Eczema Neg Hx    Immunodeficiency Neg Hx    Urticaria Neg Hx        Objective: Vitals:   12/11/22 1416  BP: (!) 156/90  Pulse: 76     Physical Exam  Lab Results:  PSA PSA  Date Value Ref Range Status  08/05/2011 0.98 0.10 - 4.00 ng/mL Final  07/22/2010 0.85 0.10 - 4.00 ng/mL Final  03/26/2009 1.03 0.10 - 4.00 ng/mL Final   No results found for: "TESTOSTERONE"  UA is clear.   Studies/Results: Results for orders placed or performed in visit on 12/11/22 (from the past 24 hour(s))  Urinalysis, Routine w reflex microscopic     Status: None   Collection Time: 12/11/22  2:20 PM  Result Value Ref Range   Specific Gravity, UA 1.020 1.005 - 1.030   pH, UA 5.5 5.0 - 7.5   Color, UA Yellow Yellow   Appearance Ur Clear Clear   Leukocytes,UA Negative Negative   Protein,UA Negative  Negative/Trace   Glucose, UA Negative Negative   Ketones, UA Negative Negative   RBC, UA Negative Negative   Bilirubin, UA Negative Negative   Urobilinogen, Ur 0.2 0.2 - 1.0 mg/dL   Nitrite, UA Negative Negative   Microscopic Examination Comment    Narrative   Performed at:  [address] - [facility] [address] [place], [place]  [id number] Lab Director: Chinita Pester MT, Phone:  [phone]     No results found. CLINICAL DATA:  Bladder cancer diagnosed in December 2021. Recent urinary frequency.   * Tracking Code: BO *   EXAM: CT ABDOMEN AND PELVIS WITHOUT AND WITH CONTRAST   TECHNIQUE: Multidetector CT imaging of the abdomen and pelvis was performed following the standard protocol before and following the bolus administration of intravenous contrast.   RADIATION DOSE REDUCTION: This exam was performed according to the departmental dose-optimization program which includes automated exposure control, adjustment of the mA and/or kV according to patient size and/or use of iterative reconstruction technique.   CONTRAST:  OMNIPAQUE IOHEXOL 300 MG/ML  SOLN   COMPARISON:  01/16/2020   FINDINGS: Lower chest: Calcified lymph nodes compatible with known sarcoidosis including a 1.5 cm in short axis distal paraesophageal lymph node on image 13 series 8, formerly the same. Progressive clustered  peribronchovascular pulmonary nodularity in the right middle lobe with some associated right middle lobe atelectasis. Subpleural nodularity noted in both lower lobes. These are also compatible sarcoidosis.   Circumflex, left anterior descending, and right coronary atherosclerosis. Aortic valve calcification.   Hepatobiliary: Unremarkable   Pancreas: Unremarkable   Spleen: Unremarkable   Adrenals/Urinary Tract: Both adrenal glands appear normal.   9.28 by 8.7 cm Bosniak category 1 cyst of the right mid to lower kidney.   2.4 by 1.9 cm left mid kidney Bosniak category  1 cyst.   Exophytic right kidney lower pole 1.8 by 1.5 cm Bosniak category 2 cyst, benign. The cystic lesions do not require any further workup.   No filling defect or abnormal enhancement along the urothelium to indicate recurrent urothelial cancer.   Stomach/Bowel: Unremarkable aside from a few scattered sigmoid colon diverticula.   Vascular/Lymphatic: Atherosclerosis is present, including aortoiliac atherosclerotic disease. There is some atheromatous plaque proximally in the SMA and celiac trunk along with plaque noted further distally in the celiac trunk is well. No overt occlusion. No pathologic adenopathy in the abdomen.   Reproductive: Unremarkable   Other: No supplemental non-categorized findings.   Musculoskeletal: Lumbar spondylosis and degenerative disc disease. Small umbilical hernia contains adipose tissue.   IMPRESSION: 1. No current findings of urothelial tumor. 2. Progressive nodularity in the lung bases in a pattern characteristic for sarcoidosis. Calcified lymph nodes in the lower thorax likewise characteristic of sarcoidosis. 3. Other imaging findings of potential clinical significance: Aortic Atherosclerosis (ICD10-I70.0). Coronary and systemic atherosclerosis. Benign renal cysts requiring no further workup. Lumbar spondylosis and degenerative disc disease.     Electronically Signed   By: Gaylyn Rong M.D.   On: 04/21/2022 08:49     Procedure:  Flexible cystoscopy.  He was prepped with betadine and 2% lidocaine jelly.  Cipro 500mg  given po.  The scope was passed and the urethra was normal with the exception of a mild membranous stricture.  There was some blanching of the external sphincter but the scope passed.  He has bilobar hyperplasia with some coaptation.  The bladder wall has mild/mod trabeculation.  There is a right posterior wall scar.  There is a persistent lesion on the left anterior and lateral wall with some inflammatory exudate and  persistent erythema. The lesion is about 0.5 x 1.5 cm along a trabecular band.  There is no apparent change.  This was inflammatory on prior biopsy.  The UO's are normal.      Assessment & Plan: Hx of  CIS s/p BCG induction x 2.   The most recent biopsies show no recurrent CIS.   He is doing well without significant voiding symptoms had the bladder wall lesion has now resolved.  UA is clear. Cytology today.   He will return in 6 months for cystoscopy.  I don't think further BCG would be appropriate.    Meds ordered this encounter  Medications   ciprofloxacin (CIPRO) tablet 500 mg       Orders Placed This Encounter  Procedures   Urinalysis, Routine w reflex microscopic   Cystoscopy     Return in about 6 months (around 06/12/2023) for with cystoscopy.   CC: Benita Stabile, MD      Bjorn Pippin 12/12/2022 Patient ID: Linnell Fulling, male   DOB: [birth year], 67 y.o.   MRN: [id number]

## 2022-12-12 LAB — CYTOLOGY, URINE

## 2023-01-13 ENCOUNTER — Other Ambulatory Visit: Payer: Self-pay

## 2023-01-13 ENCOUNTER — Emergency Department (HOSPITAL_COMMUNITY)
Admission: EM | Admit: 2023-01-13 | Discharge: 2023-01-14 | Disposition: A | Discharge status: Home / Self Care | Payer: Medicare Other | Attending: Emergency Medicine | Admitting: Emergency Medicine

## 2023-01-13 DIAGNOSIS — K1379 Other lesions of oral mucosa: Secondary | ICD-10-CM | POA: Insufficient documentation

## 2023-01-13 DIAGNOSIS — I10 Essential (primary) hypertension: Secondary | ICD-10-CM | POA: Insufficient documentation

## 2023-01-13 DIAGNOSIS — E039 Hypothyroidism, unspecified: Secondary | ICD-10-CM | POA: Diagnosis not present

## 2023-01-13 DIAGNOSIS — K068 Other specified disorders of gingiva and edentulous alveolar ridge: Secondary | ICD-10-CM | POA: Diagnosis not present

## 2023-01-13 NOTE — ED Triage Notes (Signed)
Pt had two dental implants placed this morning, states around 1800 tonight they started to bleed. Pt has not been able to stop the bleeding, has tried pressure with gauze and tea bags. Denies taking blood thinners.

## 2023-01-14 MED ORDER — SILVER NITRATE-POT NITRATE 75-25 % EX MISC
1.0000 | Freq: Once | CUTANEOUS | Status: AC
  Administered 2023-01-14: 1 via TOPICAL
  Filled 2023-01-14: qty 10

## 2023-01-14 MED ORDER — LIDOCAINE-EPINEPHRINE (PF) 1 %-1:200000 IJ SOLN
10.0000 mL | Freq: Once | INTRAMUSCULAR | Status: DC
  Filled 2023-01-14: qty 30

## 2023-01-14 MED ORDER — TRANEXAMIC ACID FOR EPISTAXIS
500.0000 mg | Freq: Once | TOPICAL | Status: AC
  Administered 2023-01-14: 500 mg via TOPICAL
  Filled 2023-01-14: qty 10

## 2023-01-14 NOTE — Discharge Instructions (Signed)
You were evaluated in the Emergency Department and after careful evaluation, we did not find any emergent condition requiring admission or further testing in the hospital.  Your exam/testing today was overall reassuring.  Recommend continuing packing the area as needed if there is any return of blood.  Recommend calling your oral and maxillofacial surgery office first thing in the morning to tell them what happened and see if you can be evaluated.  Please return to the Emergency Department if you experience any worsening of your condition.  Thank you for allowing [place] to be a part of your care.

## 2023-01-14 NOTE — ED Provider Notes (Signed)
[facility]-EMERGENCY DEPT [facility] Emergency Department Provider Note MRN:  [id number]  Arrival date & time: 01/14/23     Chief Complaint   Dental Problem   History of Present Illness   Isaac Guerrero is a 67 y.o. year-old male with a history of hypertension presenting to the ED with chief complaint of dental problem.  Dental implant placed yesterday afternoon at about 2 PM.  Started having some bleeding around the implant at 6 PM, would not stop, here for evaluation.  Review of Systems  A thorough review of systems was obtained and all systems are negative except as noted in the HPI and PMH.   Patient's Health History    Past Medical History:  Diagnosis Date   Anemia    Arthritis    oa   BPH (benign prostatic hyperplasia) 08/1989   GERD (gastroesophageal reflux disease) 11/29/1998   Hyperlipidemia 08/1997   Hypertension    Hypothyroidism 1983   Lesion of bladder    Lesion of bladder 02/28/2021   Pre-diabetes    Pulmonary nodules    Spermatocele    per Dr. Annabell Howells with URO   Urinary frequency 02/28/2021   and buring with urination    Past Surgical History:  Procedure Laterality Date   CATARACT EXTRACTION     left eye 03-2003 and right fall 2021   COLONOSCOPY  08/10/2009   polyp in ascending colon otherwise normal   COLONOSCOPY WITH ESOPHAGOGASTRODUODENOSCOPY (EGD) N/A 10/28/2013   Dr. Rourk:Schatzki's ring - not manipulated because no dysphagia currently. Small hiatal hernia.  Gastric and duodenal erosions of uncertain significance-status post biopsy. path with benign findings, no villous atrophy, no H.pylori/TCS:Colonic diverticulosis   CYSTOSCOPY  08/1989   normal   CYSTOSCOPY WITH BIOPSY N/A 02/07/2020   Procedure: CYSTOSCOPY WITH BLADDER AND PROSTATIS URETHRAL  BIOPSY WIHT FULGURATION AND INSTILL GEMCITABINE;  Surgeon: Bjorn Pippin, MD;  Location: [facility];  Service: Urology;  Laterality: N/A;   CYSTOSCOPY WITH BIOPSY Bilateral 08/09/2020    Procedure: CYSTOSCOPY WITH BLADDER BIOPSYAND FULGURATION BILATERAL RETROGRADE S;  Surgeon: Bjorn Pippin, MD;  Location: [facility];  Service: Urology;  Laterality: Bilateral;   CYSTOSCOPY WITH BIOPSY N/A 03/05/2021   Procedure: CYSTOSCOPY WITH BIOPSY FULGURATION;  Surgeon: Bjorn Pippin, MD;  [facility];  Service: Urology;  Laterality: N/A;   ESOPHAGOGASTRODUODENOSCOPY  10/1999   distal esoph. stricture; sliding H. H.   LASIK  04/12/2003   right eye   lumbar microdisectomy  2003   Dr. Gerlene Fee    Family History  Problem Relation Age of Onset   Hypertension Mother    Hyperlipidemia Mother    Diabetes Mother 13   Heart disease Father        CABG X 4  12/2001   Hyperlipidemia Father    Hypertension Father    Heart disease Brother        CABG X 1   Heart disease Paternal Uncle        MI   Cancer Maternal Grandfather        colon   Diabetes Maternal Uncle    Arthritis Maternal Uncle    Stroke Paternal Grandfather    Allergic rhinitis Neg Hx    Angioedema Neg Hx    Asthma Neg Hx    Atopy Neg Hx    Eczema Neg Hx    Immunodeficiency Neg Hx    Urticaria Neg Hx     Social History   Socioeconomic History   Marital status:  Married    Spouse name: Not on file   Number of children: 1   Years of education: Not on file   Highest education level: Not on file  Occupational History   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, water  Tobacco Use   Smoking status: Never   Smokeless tobacco: Never  Vaping Use   Vaping Use: Never used  Substance and Sexual Activity   Alcohol use: No   Drug use: No   Sexual activity: Yes  Other Topics Concern   Not on file  Social History Narrative   Married and lives with wife   Social Determinants of Health   Financial Resource Strain: Not on file  Food Insecurity: Not on file  Transportation Needs: Not on file  Physical Activity: Not on file  Stress: Not on file  Social Connections: Not  on file  Intimate Partner Violence: Not on file     Physical Exam   Vitals:   01/14/23 0030 01/14/23 0100  BP: (!) 173/98 (!) 177/87  Pulse: 72 78  Resp:  18  Temp:    SpO2: 94% 95%    CONSTITUTIONAL: Well-appearing, NAD NEURO/PSYCH:  Alert and oriented x 3, no focal deficits EYES:  eyes equal and reactive ENT/NECK:  no LAD, no JVD CARDIO: Regular rate, well-perfused, normal S1 and S2 PULM:  CTAB no wheezing or rhonchi GI/GU:  non-distended, non-tender MSK/SPINE:  No gross deformities, no edema SKIN:  no rash, atraumatic   *Additional and/or pertinent findings included in MDM below  Diagnostic and Interventional Summary    EKG Interpretation  Date/Time:    Ventricular Rate:    PR Interval:    QRS Duration:   QT Interval:    QTC Calculation:   R Axis:     Text Interpretation:         Labs Reviewed - No data to display  No orders to display    Medications  lidocaine-EPINEPHrine (PF) (XYLOCAINE-EPINEPHrine) 1 %-1:200000 (PF) injection 10 mL (has no administration in time range)  silver nitrate applicators applicator 1 Stick (1 Stick Topical Given 01/14/23 0047)  tranexamic acid (CYKLOKAPRON) 1000 MG/10ML topical solution 500 mg (500 mg Topical Given by Other 01/14/23 0134)     Procedures  /  Critical Care Procedures  ED Course and Medical Decision Making  Initial Impression and Ddx Small oozing of blood posterior to the round metallic dental implant that is located on the right side, maxillary.  Cauterization with silver nitrate not successful as the rate of the oozing is a bit too brisk.  Attempting combat gauze, pressure, TXA soaked gauze.  Past medical/surgical history that increases complexity of ED encounter: None  Interpretation of Diagnostics Laboratory and/or imaging options to aid in the diagnosis/care of the patient were considered.  After careful history and physical examination, it was determined that there was no indication for diagnostics at this  time.  Patient Reassessment and Ultimate Disposition/Management     Hemostasis appears to have been achieved after placement of a dental Surgicel foam device cut into shape so that it can surround the implant.  With this and pressure applied by packing, we seem to have achieved success.  Patient is comfortable going home and monitoring the bleeding closely, will follow-up with his dentist tomorrow, he follows with [place] oral and maxillofacial surgery in [place], his surgeon was Dr. Shea Evans.  Strict return precautions back to the emergency department for any issues.  Patient management required  discussion with the following services or consulting groups:  None  Complexity of Problems Addressed Acute illness or injury that poses threat of life of bodily function  Additional Data Reviewed and Analyzed Further history obtained from: Further history from spouse/family member  Additional Factors Impacting ED Encounter Risk Minor Procedures  Elmer Sow. Pilar Plate, MD [facility] Emergency Medicine [facility] [email or web address]  Final Clinical Impressions(s) / ED Diagnoses     ICD-10-CM   1. Oral bleeding  K13.79       ED Discharge Orders     None        Discharge Instructions Discussed with and Provided to Patient:     Discharge Instructions      You were evaluated in the Emergency Department and after careful evaluation, we did not find any emergent condition requiring admission or further testing in the hospital.  Your exam/testing today was overall reassuring.  Recommend continuing packing the area as needed if there is any return of blood.  Recommend calling your oral and maxillofacial surgery office first thing in the morning to tell them what happened and see if you can be evaluated.  Please return to the Emergency Department if you experience any worsening of your condition.  Thank you for allowing [place] to be a part of your care.        Sabas Sous, MD 01/14/23 [phone]

## 2023-01-14 NOTE — ED Notes (Signed)
Bleeding appears to have slowed down; blood noted to gauze

## 2023-01-21 DIAGNOSIS — K080 Exfoliation of teeth due to systemic causes: Secondary | ICD-10-CM | POA: Diagnosis not present

## 2023-02-10 DIAGNOSIS — E782 Mixed hyperlipidemia: Secondary | ICD-10-CM | POA: Diagnosis not present

## 2023-02-10 DIAGNOSIS — E039 Hypothyroidism, unspecified: Secondary | ICD-10-CM | POA: Diagnosis not present

## 2023-02-10 DIAGNOSIS — E1122 Type 2 diabetes mellitus with diabetic chronic kidney disease: Secondary | ICD-10-CM | POA: Diagnosis not present

## 2023-02-17 DIAGNOSIS — N1831 Chronic kidney disease, stage 3a: Secondary | ICD-10-CM | POA: Diagnosis not present

## 2023-02-17 DIAGNOSIS — I10 Essential (primary) hypertension: Secondary | ICD-10-CM | POA: Diagnosis not present

## 2023-02-17 DIAGNOSIS — I129 Hypertensive chronic kidney disease with stage 1 through stage 4 chronic kidney disease, or unspecified chronic kidney disease: Secondary | ICD-10-CM | POA: Diagnosis not present

## 2023-02-17 DIAGNOSIS — E782 Mixed hyperlipidemia: Secondary | ICD-10-CM | POA: Diagnosis not present

## 2023-02-17 DIAGNOSIS — E1122 Type 2 diabetes mellitus with diabetic chronic kidney disease: Secondary | ICD-10-CM | POA: Diagnosis not present

## 2023-02-25 DIAGNOSIS — K080 Exfoliation of teeth due to systemic causes: Secondary | ICD-10-CM | POA: Diagnosis not present

## 2023-03-10 DIAGNOSIS — N1831 Chronic kidney disease, stage 3a: Secondary | ICD-10-CM | POA: Diagnosis not present

## 2023-03-10 DIAGNOSIS — I129 Hypertensive chronic kidney disease with stage 1 through stage 4 chronic kidney disease, or unspecified chronic kidney disease: Secondary | ICD-10-CM | POA: Diagnosis not present

## 2023-03-10 DIAGNOSIS — D509 Iron deficiency anemia, unspecified: Secondary | ICD-10-CM | POA: Diagnosis not present

## 2023-03-10 DIAGNOSIS — I10 Essential (primary) hypertension: Secondary | ICD-10-CM | POA: Diagnosis not present

## 2023-05-25 DIAGNOSIS — E782 Mixed hyperlipidemia: Secondary | ICD-10-CM | POA: Diagnosis not present

## 2023-05-25 DIAGNOSIS — E039 Hypothyroidism, unspecified: Secondary | ICD-10-CM | POA: Diagnosis not present

## 2023-05-25 DIAGNOSIS — E1122 Type 2 diabetes mellitus with diabetic chronic kidney disease: Secondary | ICD-10-CM | POA: Diagnosis not present

## 2023-05-29 DIAGNOSIS — Z23 Encounter for immunization: Secondary | ICD-10-CM | POA: Diagnosis not present

## 2023-05-29 DIAGNOSIS — N1831 Chronic kidney disease, stage 3a: Secondary | ICD-10-CM | POA: Diagnosis not present

## 2023-05-29 DIAGNOSIS — D509 Iron deficiency anemia, unspecified: Secondary | ICD-10-CM | POA: Diagnosis not present

## 2023-05-29 DIAGNOSIS — E782 Mixed hyperlipidemia: Secondary | ICD-10-CM | POA: Diagnosis not present

## 2023-05-29 DIAGNOSIS — E1122 Type 2 diabetes mellitus with diabetic chronic kidney disease: Secondary | ICD-10-CM | POA: Diagnosis not present

## 2023-05-29 DIAGNOSIS — I10 Essential (primary) hypertension: Secondary | ICD-10-CM | POA: Diagnosis not present

## 2023-06-02 DIAGNOSIS — E119 Type 2 diabetes mellitus without complications: Secondary | ICD-10-CM | POA: Diagnosis not present

## 2023-06-04 ENCOUNTER — Ambulatory Visit: Payer: Medicare Other | Admitting: Urology

## 2023-06-04 ENCOUNTER — Encounter: Payer: Self-pay | Admitting: Urology

## 2023-06-04 VITALS — BP 165/83 | HR 65 | Ht 70.0 in | Wt 185.0 lb

## 2023-06-04 DIAGNOSIS — Z87440 Personal history of urinary (tract) infections: Secondary | ICD-10-CM | POA: Diagnosis not present

## 2023-06-04 DIAGNOSIS — Z8551 Personal history of malignant neoplasm of bladder: Secondary | ICD-10-CM

## 2023-06-04 DIAGNOSIS — R8289 Other abnormal findings on cytological and histological examination of urine: Secondary | ICD-10-CM | POA: Diagnosis not present

## 2023-06-04 MED ORDER — CIPROFLOXACIN HCL 500 MG PO TABS
500.0000 mg | ORAL_TABLET | Freq: Once | ORAL | Status: AC
  Administered 2023-06-04: 500 mg via ORAL

## 2023-06-04 NOTE — Progress Notes (Signed)
Subjective:  1. History of bladder cancer   2. Personal history of urinary infection     06/05/23: [name] returns today in f/u for cystoscopy.   He is voiding well and has had no hematuria.  His UA is clear today.  IPSS is 4.   12/11/22: Isaac Guerrero retuns today for surveillance cystoscopy for his history of CIS of the bladder with the treatment history below.   He had COVID in February but has recovered from. He is voiding well with an IPSS of 4 and nocturia x 1.  He has had no hematuria.    07/17/22: Isaac Guerrero returns today in f/u for the history below.  He had a CT hematuria study in 8/23 that showed no urothelial or renal abnormalities.   He has no new complaints and his urine is clear.   7/272/3: Isaac Guerrero returns today for his history of CIS of the bladder.  He has had no hematuria.  His IPSS is 5 with nocturia x 1.  He had severe bladder inflammation after the BCG so maintenance was not felt to be appropriate.  He has some issues with ED but is not interested in therapy.     12/26/21: Isaac Guerrero returns today for surveillance cystoscopy for his history of CIS that was last detected on 08/09/20.  He has been treated with BCG with a second induction course completed in 5/22. He had persistent irritative symptoms that eventually resolved.  He last had a biopsy on 03/05/21 and that just showed inflammation.  He had a MRSA UTI in the past as well.  His UA is clear.  He is voiding well with an IPSS of 6 with nocturia x 1 and some frequency.  He has issues with ED but has a headaches with viagra and cialis.  He has had some recent right testicular pain that improved with gabapentin and has none in a month.      IPSS     Row Name 06/04/23 1500         International Prostate Symptom Score   How often have you had the sensation of not emptying your bladder? Not at All     How often have you had to urinate less than every two hours? Less than 1 in 5 times     How often have you found you stopped and started again several  times when you urinated? Less than 1 in 5 times     How often have you found it difficult to postpone urination? Less than 1 in 5 times     How often have you had a weak urinary stream? Not at All     How often have you had to strain to start urination? Not at All     How many times did you typically get up at night to urinate? 1 Time     Total IPSS Score 4       Quality of Life due to urinary symptoms   If you were to spend the rest of your life with your urinary condition just the way it is now how would you feel about that? Mostly Satisfied                  ROS:  ROS:  A complete review of systems was performed.  All systems are negative except for pertinent findings as noted.   Review of Systems  All other systems reviewed and are negative.   Allergies  Allergen Reactions   Cephalexin Rash  Penicillins Rash   Sulfonamide Derivatives Rash    Outpatient Encounter Medications as of 06/04/2023  Medication Sig   b complex vitamins tablet Take 1 tablet by mouth daily.   Cholecalciferol (VITAMIN D3) 2000 UNITS TABS Take 2,000 Units by mouth daily.   Coenzyme Q10 200 MG capsule Take 200 mg by mouth daily.   gabapentin (NEURONTIN) 100 MG capsule Take 100 mg by mouth at bedtime as needed.   levothyroxine (SYNTHROID) 88 MCG tablet Take 88 mcg by mouth daily before breakfast.   loratadine (CLARITIN) 10 MG tablet Take 10 mg by mouth daily.   losartan (COZAAR) 50 MG tablet Take 50 mg by mouth daily.   meloxicam (MOBIC) 15 MG tablet Take 15 mg by mouth daily as needed for pain.    Omega-3 1400 MG CAPS    pantoprazole (PROTONIX) 40 MG tablet Take 40 mg by mouth daily.   traMADol (ULTRAM) 50 MG tablet Take 50 mg by mouth every 6 (six) hours as needed for moderate pain.    Turmeric Curcumin 500 MG CAPS    ZOCOR 40 MG tablet TAKE ONE TABLET BY MOUTH IN THE EVENING.   [EXPIRED] ciprofloxacin (CIPRO) tablet 500 mg    No facility-administered encounter medications on file as of  06/04/2023.    Past Medical History:  Diagnosis Date   Anemia    Arthritis    oa   BPH (benign prostatic hyperplasia) 08/1989   GERD (gastroesophageal reflux disease) 11/29/1998   Hyperlipidemia 08/1997   Hypertension    Hypothyroidism 1983   Lesion of bladder    Lesion of bladder 02/28/2021   Pre-diabetes    Pulmonary nodules    Spermatocele    per Dr. Annabell Howells with URO   Urinary frequency 02/28/2021   and buring with urination    Past Surgical History:  Procedure Laterality Date   CATARACT EXTRACTION     left eye 03-2003 and right fall 2021   COLONOSCOPY  08/10/2009   polyp in ascending colon otherwise normal   COLONOSCOPY WITH ESOPHAGOGASTRODUODENOSCOPY (EGD) N/A 10/28/2013   Dr. Rourk:Schatzki's ring - not manipulated because no dysphagia currently. Small hiatal hernia.  Gastric and duodenal erosions of uncertain significance-status post biopsy. path with benign findings, no villous atrophy, no H.pylori/TCS:Colonic diverticulosis   CYSTOSCOPY  08/1989   normal   CYSTOSCOPY WITH BIOPSY N/A 02/07/2020   Procedure: CYSTOSCOPY WITH BLADDER AND PROSTATIS URETHRAL  BIOPSY WIHT FULGURATION AND INSTILL GEMCITABINE;  Surgeon: Bjorn Pippin, MD;  Location: [facility];  Service: Urology;  Laterality: N/A;   CYSTOSCOPY WITH BIOPSY Bilateral 08/09/2020   Procedure: CYSTOSCOPY WITH BLADDER BIOPSYAND FULGURATION BILATERAL RETROGRADE S;  Surgeon: Bjorn Pippin, MD;  Location: [facility];  Service: Urology;  Laterality: Bilateral;   CYSTOSCOPY WITH BIOPSY N/A 03/05/2021   Procedure: CYSTOSCOPY WITH BIOPSY FULGURATION;  Surgeon: Bjorn Pippin, MD;  Location: [facility];  Service: Urology;  Laterality: N/A;   ESOPHAGOGASTRODUODENOSCOPY  10/1999   distal esoph. stricture; sliding H. H.   LASIK  04/12/2003   right eye   lumbar microdisectomy  2003   Dr. Gerlene Fee    Social History   Socioeconomic History   Marital status: Married    Spouse name: Not on file   Number of  children: 1   Years of education: Not on file   Highest education level: Not on file  Occupational History   Occupation: Curator    Employer: [employer]    Comment: Engineer, water  Tobacco Use  Smoking status: Never   Smokeless tobacco: Never  Vaping Use   Vaping status: Never Used  Substance and Sexual Activity   Alcohol use: No   Drug use: No   Sexual activity: Yes  Other Topics Concern   Not on file  Social History Narrative   Married and lives with wife   Social Determinants of Health   Financial Resource Strain: Not on file  Food Insecurity: Not on file  Transportation Needs: Not on file  Physical Activity: Not on file  Stress: Not on file  Social Connections: Not on file  Intimate Partner Violence: Not on file    Family History  Problem Relation Age of Onset   Hypertension Mother    Hyperlipidemia Mother    Diabetes Mother 57   Heart disease Father        CABG X 4  12/2001   Hyperlipidemia Father    Hypertension Father    Heart disease Brother        CABG X 1   Heart disease Paternal Uncle        MI   Cancer Maternal Grandfather        colon   Diabetes Maternal Uncle    Arthritis Maternal Uncle    Stroke Paternal Grandfather    Allergic rhinitis Neg Hx    Angioedema Neg Hx    Asthma Neg Hx    Atopy Neg Hx    Eczema Neg Hx    Immunodeficiency Neg Hx    Urticaria Neg Hx        Objective: Vitals:   06/04/23 1427  BP: (!) 165/83  Pulse: 65     Physical Exam  Lab Results:  PSA PSA  Date Value Ref Range Status  08/05/2011 0.98 0.10 - 4.00 ng/mL Final  07/22/2010 0.85 0.10 - 4.00 ng/mL Final  03/26/2009 1.03 0.10 - 4.00 ng/mL Final   No results found for: "TESTOSTERONE"  UA is clear.   Studies/Results: Results for orders placed or performed in visit on 06/04/23 (from the past 24 hour(s))  Urinalysis, Routine w reflex microscopic     Status: None   Collection Time: 06/04/23  2:29 PM  Result Value Ref Range   Specific  Gravity, UA 1.025 1.005 - 1.030   pH, UA 6.0 5.0 - 7.5   Color, UA Yellow Yellow   Appearance Ur Clear Clear   Leukocytes,UA Negative Negative   Protein,UA Negative Negative/Trace   Glucose, UA Negative Negative   Ketones, UA Negative Negative   RBC, UA Negative Negative   Bilirubin, UA Negative Negative   Urobilinogen, Ur 0.2 0.2 - 1.0 mg/dL   Nitrite, UA Negative Negative   Microscopic Examination Comment    Narrative   Performed at:  [address] [facility] [address], [place], [place]  [id number] Lab Director: Chinita Pester MT, Phone:  [phone]      No results found. CLINICAL DATA:  Bladder cancer diagnosed in December 2021. Recent urinary frequency.   * Tracking Code: BO *   EXAM: CT ABDOMEN AND PELVIS WITHOUT AND WITH CONTRAST   TECHNIQUE: Multidetector CT imaging of the abdomen and pelvis was performed following the standard protocol before and following the bolus administration of intravenous contrast.   RADIATION DOSE REDUCTION: This exam was performed according to the departmental dose-optimization program which includes automated exposure control, adjustment of the mA and/or kV according to patient size and/or use of iterative reconstruction technique.   CONTRAST:  OMNIPAQUE IOHEXOL 300 MG/ML  SOLN   COMPARISON:  01/16/2020   FINDINGS: Lower chest: Calcified lymph nodes compatible with known sarcoidosis including a 1.5 cm in short axis distal paraesophageal lymph node on image 13 series 8, formerly the same. Progressive clustered peribronchovascular pulmonary nodularity in the right middle lobe with some associated right middle lobe atelectasis. Subpleural nodularity noted in both lower lobes. These are also compatible sarcoidosis.   Circumflex, left anterior descending, and right coronary atherosclerosis. Aortic valve calcification.   Hepatobiliary: Unremarkable   Pancreas: Unremarkable   Spleen: Unremarkable   Adrenals/Urinary  Tract: Both adrenal glands appear normal.   9.28 by 8.7 cm Bosniak category 1 cyst of the right mid to lower kidney.   2.4 by 1.9 cm left mid kidney Bosniak category 1 cyst.   Exophytic right kidney lower pole 1.8 by 1.5 cm Bosniak category 2 cyst, benign. The cystic lesions do not require any further workup.   No filling defect or abnormal enhancement along the urothelium to indicate recurrent urothelial cancer.   Stomach/Bowel: Unremarkable aside from a few scattered sigmoid colon diverticula.   Vascular/Lymphatic: Atherosclerosis is present, including aortoiliac atherosclerotic disease. There is some atheromatous plaque proximally in the SMA and celiac trunk along with plaque noted further distally in the celiac trunk is well. No overt occlusion. No pathologic adenopathy in the abdomen.   Reproductive: Unremarkable   Other: No supplemental non-categorized findings.   Musculoskeletal: Lumbar spondylosis and degenerative disc disease. Small umbilical hernia contains adipose tissue.   IMPRESSION: 1. No current findings of urothelial tumor. 2. Progressive nodularity in the lung bases in a pattern characteristic for sarcoidosis. Calcified lymph nodes in the lower thorax likewise characteristic of sarcoidosis. 3. Other imaging findings of potential clinical significance: Aortic Atherosclerosis (ICD10-I70.0). Coronary and systemic atherosclerosis. Benign renal cysts requiring no further workup. Lumbar spondylosis and degenerative disc disease.     Electronically Signed   By: Gaylyn Rong M.D.   On: 04/21/2022 08:49     Procedure:  Flexible cystoscopy.  He was prepped with betadine and 2% lidocaine jelly.  Cipro 500mg  given po.  The scope was passed and the urethra was normal with the exception of a mild membranous stricture.  There was some blanching of the external sphincter but the scope passed.  He has bilobar hyperplasia with some coaptation.  The bladder wall  has mild/mod trabeculation.  There is a right posterior wall scar.  There is a persistent lesion on the left anterior and lateral wall with some inflammatory exudate and persistent erythema but it appears smaller than on the previous cysto. The lesion is about 2-31mm along a trabecular band.   This was inflammatory on prior biopsy.  The UO's are normal.      Assessment & Plan: Hx of  CIS s/p BCG induction x 2.   The most recent biopsies show no recurrent CIS.   He is doing well without significant voiding symptoms had the bladder wall lesion has now resolved.  UA is clear. Cytology today.   He will return in 12 months for cystoscopy.  I don't think further BCG would be appropriate.    Meds ordered this encounter  Medications   ciprofloxacin (CIPRO) tablet 500 mg       Orders Placed This Encounter  Procedures   Urinalysis, Routine w reflex microscopic   Cystoscopy     Return in about 1 year (around 06/03/2024) for with cystoscopy. .   CC: Benita Stabile, MD      Bjorn Pippin 06/05/2023  Patient ID: Isaac Guerrero, male   DOB: 10/20/1955, 67 y.o.   MRN: [id number]

## 2023-06-05 LAB — CYTOLOGY, URINE

## 2023-06-05 LAB — URINALYSIS, ROUTINE W REFLEX MICROSCOPIC
Bilirubin, UA: NEGATIVE
Glucose, UA: NEGATIVE
Ketones, UA: NEGATIVE
Leukocytes,UA: NEGATIVE
Nitrite, UA: NEGATIVE
Protein,UA: NEGATIVE
RBC, UA: NEGATIVE
Specific Gravity, UA: 1.025 (ref 1.005–1.030)
Urobilinogen, Ur: 0.2 mg/dL (ref 0.2–1.0)
pH, UA: 6 (ref 5.0–7.5)

## 2023-06-18 ENCOUNTER — Other Ambulatory Visit: Payer: Medicare Other | Admitting: Urology

## 2023-07-03 DIAGNOSIS — E119 Type 2 diabetes mellitus without complications: Secondary | ICD-10-CM | POA: Diagnosis not present

## 2023-07-03 DIAGNOSIS — M47814 Spondylosis without myelopathy or radiculopathy, thoracic region: Secondary | ICD-10-CM | POA: Diagnosis not present

## 2023-07-03 DIAGNOSIS — M47816 Spondylosis without myelopathy or radiculopathy, lumbar region: Secondary | ICD-10-CM | POA: Diagnosis not present

## 2023-07-10 ENCOUNTER — Other Ambulatory Visit: Payer: Self-pay

## 2023-07-10 ENCOUNTER — Ambulatory Visit (HOSPITAL_COMMUNITY): Payer: Medicare Other | Attending: Orthopedic Surgery

## 2023-07-10 DIAGNOSIS — M546 Pain in thoracic spine: Secondary | ICD-10-CM | POA: Diagnosis not present

## 2023-07-10 NOTE — Therapy (Signed)
OUTPATIENT PHYSICAL THERAPY THORACOLUMBAR EVALUATION   Patient Name: Isaac Guerrero MRN: [id number] DOB:[birth year], 67 y.o., male Today's Date: 07/10/2023  END OF SESSION:  PT End of Session - 07/10/23 1035     Visit Number 1    Number of Visits 8    Date for PT Re-Evaluation 09/04/23    Authorization Type BCBS Medicare (no auth, no limit)    PT Start Time 0930    PT Stop Time 1010    PT Time Calculation (min) 40 min    Activity Tolerance Patient tolerated treatment well    Behavior During Therapy [facility] for tasks assessed/performed            Past Medical History:  Diagnosis Date   Anemia    Arthritis    oa   BPH (benign prostatic hyperplasia) 08/1989   GERD (gastroesophageal reflux disease) 11/29/1998   Hyperlipidemia 08/1997   Hypertension    Hypothyroidism 1983   Lesion of bladder    Lesion of bladder 02/28/2021   Pre-diabetes    Pulmonary nodules    Spermatocele    per Dr. Annabell Howells with URO   Urinary frequency 02/28/2021   and buring with urination   Past Surgical History:  Procedure Laterality Date   CATARACT EXTRACTION     left eye 03-2003 and right fall 2021   COLONOSCOPY  08/10/2009   polyp in ascending colon otherwise normal   COLONOSCOPY WITH ESOPHAGOGASTRODUODENOSCOPY (EGD) N/A 10/28/2013   Dr. Rourk:Schatzki's ring - not manipulated because no dysphagia currently. Small hiatal hernia.  Gastric and duodenal erosions of uncertain significance-status post biopsy. path with benign findings, no villous atrophy, no H.pylori/TCS:Colonic diverticulosis   CYSTOSCOPY  08/1989   normal   CYSTOSCOPY WITH BIOPSY N/A 02/07/2020   Procedure: CYSTOSCOPY WITH BLADDER AND PROSTATIS URETHRAL  BIOPSY WIHT FULGURATION AND INSTILL GEMCITABINE;  Surgeon: Bjorn Pippin, MD;  Location: [facility];  Service: Urology;  Laterality: N/A;   CYSTOSCOPY WITH BIOPSY Bilateral 08/09/2020   Procedure: CYSTOSCOPY WITH BLADDER BIOPSYAND FULGURATION BILATERAL RETROGRADE S;   Surgeon: Bjorn Pippin, MD;  Location: [facility];  Service: Urology;  Laterality: Bilateral;   CYSTOSCOPY WITH BIOPSY N/A 03/05/2021   Procedure: CYSTOSCOPY WITH BIOPSY FULGURATION;  Surgeon: Bjorn Pippin, MD;  Location: [facility];  Service: Urology;  Laterality: N/A;   ESOPHAGOGASTRODUODENOSCOPY  10/1999   distal esoph. stricture; sliding H. H.   LASIK  04/12/2003   right eye   lumbar microdisectomy  2003   Dr. Gerlene Fee   Patient Active Problem List   Diagnosis Date Noted   Rhinitis, chronic 05/26/2022   Multiple pulmonary nodules determined by computed tomography of lung 04/10/2020   DOE (dyspnea on exertion) 04/10/2020   Seasonal and perennial allergic rhinitis 06/23/2017   Non-seasonal allergic rhinitis due to fungal spores 12/23/2016   Anemia 10/10/2013   Knee pain 05/14/2011   PALPITATIONS 07/21/2010   LOW BACK PAIN, CHRONIC 03/27/2009   Hypothyroidism 09/23/2007   HYPERLIPIDEMIA 09/23/2007   GERD 09/23/2007   BENIGN PROSTATIC HYPERTROPHY 09/23/2007   PSORIASIS 09/22/2007   HYPERGLYCEMIA 09/22/2007    PCP: Benita Stabile, MD  REFERRING PROVIDER: Adelfa Koh, MD  REFERRING DIAG: [phone] (ICD-10-CM) - Spondylosis without myelopathy or radiculopathy, thoracic region  Rationale for Evaluation and Treatment: Rehabilitation  THERAPY DIAG:  Pain in thoracic spine  ONSET DATE: 2019  SUBJECTIVE:  SUBJECTIVE STATEMENT: Arrives to the clinic with c/o intermittent mid back pain (see below). Denies pain at the time of evaluation. Condition started in 2019 which gradually got worse without apparent reason. A month ago, pain got really worse so patient decided to go to his MD. X-ray was done and revealed bone spurs and arthritis. Patient was given Prednisone and was instructed  to take the medications when he starts PT. Patient was then referred to outpatient PT evaluation and management. Patient states that the back pain is getting better now since MD appointment.  PERTINENT HISTORY:  Hx of lumbar discectomy (2000)  PAIN:  Are you having pain? Yes: NPRS scale: 7/10 Pain location: mid back, radiates to the R side of the spine and just above the buttocks Pain description: tingling, intermittent Aggravating factors: Sitting for 3-4 hours, bending, driving, lifting Relieving factors: laying down flat, Tramadol  PRECAUTIONS: None  RED FLAGS: Bowel or bladder incontinence: No, Cauda equina syndrome: No, and Compression fracture: No   WEIGHT BEARING RESTRICTIONS: No  FALLS:  Has patient fallen in last 6 months? No  LIVING ENVIRONMENT: Lives with: lives with their spouse Lives in: House/apartment Stairs: Yes: External: 2 steps; none Has following equipment at home: None  OCCUPATION: retired  PLOF: Independent and Independent with basic ADLs  PATIENT GOALS: "just to get better"  NEXT MD VISIT: PRN  OBJECTIVE:  Note: Objective measures were completed at Evaluation unless otherwise noted.  DIAGNOSTIC FINDINGS:  None done recently  PATIENT SURVEYS:  FOTO 50.58  SCREENING FOR RED FLAGS: Bowel or bladder incontinence: No Spinal tumors: No Cauda equina syndrome: No Compression fracture: No  COGNITION: Overall cognitive status: Within functional limits for tasks assessed     SENSATION: Light touch: WFL on B LE  MUSCLE LENGTH: Hamstrings: moderate restriction on B Thomas test: mild restriction on B hip flexors Piriformis: mild restriction on B Pectoralis: moderate restriction on B Upper trapezius: moderate restriction on B  POSTURE: rounded shoulders, forward head, decreased lumbar lordosis, and increased thoracic kyphosis  PALPATION: No tenderness on paraspinals and major bony landmarks of the thoracic spine  THORACIC ROM:   AROM eval   Flexion 75%  Extension 25%  Right lateral flexion   Left lateral flexion   Right rotation 50%  Left rotation 50%   (Blank rows = not tested)  LOWER EXTREMITY ROM:     Active  Right eval Left eval  Hip flexion [facility]  Hip extension [facility]  Hip abduction [facility]  Hip adduction    Hip internal rotation    Hip external rotation    Knee flexion [facility]  Knee extension [facility]  Ankle dorsiflexion [facility] WFL  Ankle plantarflexion [facility] WFL  Ankle inversion    Ankle eversion     (Blank rows = not tested)  UPPER EXTREMITY MMT:  MMT Right eval Left eval  Shoulder flexion 5 5  Shoulder abduction 5 5  Shoulder internal rotation 5 5  Shoulder external rotation 5 5  Middle trapezius 4 4  Elbow flexion 5 5  Elbow extension 5 5  Grip 5 5   (Blank rows = not tested)  LOWER EXTREMITY MMT:    MMT Right eval Left eval  Hip flexion 4+ 4+  Hip extension 4+ 4+  Hip abduction 4+ 4+  Hip adduction    Hip internal rotation    Hip external rotation    Knee flexion 5 5  Knee extension 5 5  Ankle dorsiflexion 5 5  Ankle plantarflexion 5 5  Ankle inversion    Ankle eversion     (Blank rows = not tested)  LUMBAR SPECIAL TESTS:  Straight leg raise test: positive on B for hamstring tightness  FUNCTIONAL TESTS:  5 times sit to stand: 8.46 sec 2 minute walk test: 523 ft  GAIT: Distance walked: 523 Assistive device utilized: None Level of assistance: Complete Independence Comments: done during , no gait deviations seen  TODAY'S TREATMENT:                                                                                                                              DATE:  07/10/23 Evaluation and patient education done    PATIENT EDUCATION:  Education details: Educated on the pathoanatomy of thoracic pain. Educated on the goals and course of rehab.  Person educated: Patient Education method: Explanation Education comprehension: verbalized understanding  HOME EXERCISE  PROGRAM: None provided to date.  ASSESSMENT:  CLINICAL IMPRESSION: Patient is a 67 y.o. male who was seen today for physical therapy evaluation and treatment for spondylosis of the thoracic region. Patient's condition is further defined by difficulty with prolonged sitting, bending, and lifting due to pain, weakness, and decreased soft tissue extensibility. Skilled PT is required to address the impairments and functional limitations listed below.    OBJECTIVE IMPAIRMENTS: decreased ROM, decreased strength, hypomobility, impaired flexibility, postural dysfunction, and pain.   ACTIVITY LIMITATIONS: carrying, lifting, bending, and sitting  PARTICIPATION LIMITATIONS: meal prep, cleaning, laundry, driving, shopping, community activity, and yard work  PERSONAL FACTORS: Time since onset of injury/illness/exacerbation are also affecting patient's functional outcome.   REHAB POTENTIAL: Good  CLINICAL DECISION MAKING: Stable/uncomplicated  EVALUATION COMPLEXITY: Low   GOALS: Goals reviewed with patient? Yes  SHORT TERM GOALS: Target date: 07/24/23  Pt will demonstrate indep in HEP to facilitate carry-over of skilled services and improve functional outcomes Goal status: INITIAL  LONG TERM GOALS: Target date: 08/07/23  Pt will increase FOTO to at least 61 in order to demonstrate significant improvement in function related to  ADLs Baseline: 50.58 Goal status: INITIAL  2.  Pt will demonstrate increase in thoracic extension and rotation ROM by 25%  to facilitate ease in ADLs  Baseline: see above Goal status: INITIAL  3.  Patient will demonstrate increase in UE strength to 5/5 to facilitate ease in ADLs  Baseline: 4/5 Goal status: INITIAL  4.  Pt will demonstrate increase in LE strength to 5/5 to facilitate ease and safety in ADLs  Baseline: 4+/5 Goal status: INITIAL  PLAN:  PT FREQUENCY: 2x/week  PT DURATION: 4 weeks  PLANNED INTERVENTIONS: 97164- PT Re-evaluation,  97110-Therapeutic exercises, 97530- Therapeutic activity, 97112- Neuromuscular re-education, 97535- Self Care, 16109- Manual therapy, 97014- Electrical stimulation (unattended), Patient/Family education, Taping, Cryotherapy, and Moist heat.  PLAN FOR NEXT SESSION: Provide HEP. Begin thoracic mobility and strengthening activities as well as LE and core strengthening.   Tish Frederickson. Breena Bevacqua, PT, DPT, OCS Board-Certified Clinical  Specialist in Orthopedic PT PT Compact Privilege # ([place]): [id number]  07/10/2023, 11:07 AM

## 2023-07-15 ENCOUNTER — Encounter (HOSPITAL_COMMUNITY): Payer: Self-pay

## 2023-07-15 ENCOUNTER — Ambulatory Visit (HOSPITAL_COMMUNITY): Payer: Medicare Other

## 2023-07-15 DIAGNOSIS — M546 Pain in thoracic spine: Secondary | ICD-10-CM | POA: Diagnosis not present

## 2023-07-15 NOTE — Therapy (Signed)
OUTPATIENT PHYSICAL THERAPY THORACOLUMBAR TREATMENT   Patient Name: Isaac Guerrero MRN: [id number] DOB:1956/07/10, 67 y.o., male Today's Date: 07/15/2023  END OF SESSION:  PT End of Session - 07/15/23 1005     Visit Number 2    Number of Visits 8    Date for PT Re-Evaluation 08/07/23    Authorization Type BCBS Medicare (no auth, no limit)    PT Start Time 1012    PT Stop Time 1050    PT Time Calculation (min) 38 min    Activity Tolerance Patient tolerated treatment well    Behavior During Therapy WFL for tasks assessed/performed            Past Medical History:  Diagnosis Date   Anemia    Arthritis    oa   BPH (benign prostatic hyperplasia) 08/1989   GERD (gastroesophageal reflux disease) 11/29/1998   Hyperlipidemia 08/1997   Hypertension    Hypothyroidism 1983   Lesion of bladder    Lesion of bladder 02/28/2021   Pre-diabetes    Pulmonary nodules    Spermatocele    per Dr. Annabell Howells with URO   Urinary frequency 02/28/2021   and buring with urination   Past Surgical History:  Procedure Laterality Date   CATARACT EXTRACTION     left eye 03-2003 and right fall 2021   COLONOSCOPY  08/10/2009   polyp in ascending colon otherwise normal   COLONOSCOPY WITH ESOPHAGOGASTRODUODENOSCOPY (EGD) N/A 10/28/2013   Dr. Rourk:Schatzki's ring - not manipulated because no dysphagia currently. Small hiatal hernia.  Gastric and duodenal erosions of uncertain significance-status post biopsy. path with benign findings, no villous atrophy, no H.pylori/TCS:Colonic diverticulosis   CYSTOSCOPY  08/1989   normal   CYSTOSCOPY WITH BIOPSY N/A 02/07/2020   Procedure: CYSTOSCOPY WITH BLADDER AND PROSTATIS URETHRAL  BIOPSY WIHT FULGURATION AND INSTILL GEMCITABINE;  Surgeon: Bjorn Pippin, MD;  Location: [facility];  Service: Urology;  Laterality: N/A;   CYSTOSCOPY WITH BIOPSY Bilateral 08/09/2020   Procedure: CYSTOSCOPY WITH BLADDER BIOPSYAND FULGURATION BILATERAL RETROGRADE S;   Surgeon: Bjorn Pippin, MD;  Location: [facility];  Service: Urology;  Laterality: Bilateral;   CYSTOSCOPY WITH BIOPSY N/A 03/05/2021   Procedure: CYSTOSCOPY WITH BIOPSY FULGURATION;  Surgeon: Bjorn Pippin, MD;  Location[facility];  Service: Urology;  Laterality: N/A;   ESOPHAGOGASTRODUODENOSCOPY  10/1999   distal esoph. stricture; sliding H. H.   LASIK  04/12/2003   right eye   lumbar microdisectomy  2003   Dr. Gerlene Fee   Patient Active Problem List   Diagnosis Date Noted   Rhinitis, chronic 05/26/2022   Multiple pulmonary nodules determined by computed tomography of lung 04/10/2020   DOE (dyspnea on exertion) 04/10/2020   Seasonal and perennial allergic rhinitis 06/23/2017   Non-seasonal allergic rhinitis due to fungal spores 12/23/2016   Anemia 10/10/2013   Knee pain 05/14/2011   PALPITATIONS 07/21/2010   LOW BACK PAIN, CHRONIC 03/27/2009   Hypothyroidism 09/23/2007   HYPERLIPIDEMIA 09/23/2007   GERD 09/23/2007   BENIGN PROSTATIC HYPERTROPHY 09/23/2007   PSORIASIS 09/22/2007   HYPERGLYCEMIA 09/22/2007    PCP: Benita Stabile, MD  REFERRING PROVIDER: Adelfa Koh, MD  REFERRING DIAG: [phone] (ICD-10-CM) - Spondylosis without myelopathy or radiculopathy, thoracic region  Rationale for Evaluation and Treatment: Rehabilitation  THERAPY DIAG:  Pain in thoracic spine  ONSET DATE: 2019  SUBJECTIVE:  SUBJECTIVE STATEMENT: 07/15/23:  Pt stated he is feeling good today, no reports of pain today. Pain usually comes with sitting for prolonged period of time.    Eval:  Arrives to the clinic with c/o intermittent mid back pain (see below). Denies pain at the time of evaluation. Condition started in 2019 which gradually got worse without apparent reason. A month ago, pain got  really worse so patient decided to go to his MD. X-ray was done and revealed bone spurs and arthritis. Patient was given Prednisone and was instructed to take the medications when he starts PT. Patient was then referred to outpatient PT evaluation and management. Patient states that the back pain is getting better now since MD appointment.  PERTINENT HISTORY:  Hx of lumbar discectomy (2000)  PAIN:  Are you having pain? Yes: NPRS scale: 7/10 Pain location: mid back, radiates to the R side of the spine and just above the buttocks Pain description: tingling, intermittent Aggravating factors: Sitting for 3-4 hours, bending, driving, lifting Relieving factors: laying down flat, Tramadol  PRECAUTIONS: None  RED FLAGS: Bowel or bladder incontinence: No, Cauda equina syndrome: No, and Compression fracture: No   WEIGHT BEARING RESTRICTIONS: No  FALLS:  Has patient fallen in last 6 months? No  LIVING ENVIRONMENT: Lives with: lives with their spouse Lives in: House/apartment Stairs: Yes: External: 2 steps; none Has following equipment at home: None  OCCUPATION: retired  PLOF: Independent and Independent with basic ADLs  PATIENT GOALS: "just to get better"  NEXT MD VISIT: PRN  OBJECTIVE:  Note: Objective measures were completed at Evaluation unless otherwise noted.  DIAGNOSTIC FINDINGS:  None done recently  PATIENT SURVEYS:  FOTO 50.58  SCREENING FOR RED FLAGS: Bowel or bladder incontinence: No Spinal tumors: No Cauda equina syndrome: No Compression fracture: No  COGNITION: Overall cognitive status: Within functional limits for tasks assessed     SENSATION: Light touch: WFL on B LE  MUSCLE LENGTH: Hamstrings: moderate restriction on B Thomas test: mild restriction on B hip flexors Piriformis: mild restriction on B Pectoralis: moderate restriction on B Upper trapezius: moderate restriction on B  POSTURE: rounded shoulders, forward head, decreased lumbar lordosis,  and increased thoracic kyphosis  PALPATION: No tenderness on paraspinals and major bony landmarks of the thoracic spine  THORACIC ROM:   AROM eval  Flexion 75%  Extension 25%  Right lateral flexion   Left lateral flexion   Right rotation 50%  Left rotation 50%   (Blank rows = not tested)  LOWER EXTREMITY ROM:     Active  Right eval Left eval  Hip flexion [facility]  Hip extension [facility]  Hip abduction [facility]  Hip adduction    Hip internal rotation    Hip external rotation    Knee flexion [facility]  Knee extension [facility]  Ankle dorsiflexion [facility] WFL  Ankle plantarflexion [facility] WFL  Ankle inversion    Ankle eversion     (Blank rows = not tested)  UPPER EXTREMITY MMT:  MMT Right eval Left eval  Shoulder flexion 5 5  Shoulder abduction 5 5  Shoulder internal rotation 5 5  Shoulder external rotation 5 5  Middle trapezius 4 4  Elbow flexion 5 5  Elbow extension 5 5  Grip 5 5   (Blank rows = not tested)  LOWER EXTREMITY MMT:    MMT Right eval Left eval  Hip flexion 4+ 4+  Hip extension 4+ 4+  Hip abduction 4+ 4+  Hip adduction  Hip internal rotation    Hip external rotation    Knee flexion 5 5  Knee extension 5 5  Ankle dorsiflexion 5 5  Ankle plantarflexion 5 5  Ankle inversion    Ankle eversion     (Blank rows = not tested)  LUMBAR SPECIAL TESTS:  Straight leg raise test: positive on B for hamstring tightness  FUNCTIONAL TESTS:  5 times sit to stand: 8.46 sec 2 minute walk test: 523 ft  GAIT: Distance walked: 523 Assistive device utilized: None Level of assistance: Complete Independence Comments: done during , no gait deviations seen  TODAY'S TREATMENT:                                                                                                                              DATE:  07/15/23: Reviewed goals Educated importance of HEP compliance for maximal benefits   Seated: 3D thoracic excursion 5x each with UE overhead Discussed  seated posture Lumbar support Wback 10x X to V 10x Cervical retraction 10x. Upper trap stretch 2x 30"  Supine:: Hamstring stretch 2x 30" (1 with hands behind knees, 1 with rope)  Standing: 3D hip excursion10x (lateral weight shift, rotation, squat front of chair and lumbar extension)  07/10/23 Evaluation and patient education done    PATIENT EDUCATION:  Education details: Educated on the pathoanatomy of thoracic pain. Educated on the goals and course of rehab.  Person educated: Patient Education method: Explanation Education comprehension: verbalized understanding  HOME EXERCISE PROGRAM:  07/15/23:  3D thoracic excursion Access Code: [email or web address] URL: [email or web address] Date: 07/15/2023 Prepared by: Becky Sax  Exercises - Seated Scapular Retraction with External Rotation  - 1 x daily - 7 x weekly - 3 sets - 10 reps - Seated Passive Cervical Retraction  - 1 x daily - 7 x weekly - 3 sets - 10 reps - Seated Upper Trapezius Stretch  - 2 x daily - 7 x weekly - 3 sets - 3 reps - 30" hold - Supine Hamstring Stretch  - 1-2 x daily - 7 x weekly - 1 sets - 3 reps - 30" hold   ASSESSMENT:  CLINICAL IMPRESSION: Reviewed goals and educated importance of HEP compliance for maximal benefits with therapy.  Session focus on mobility and strengthening.  Pt educated on importance of posture for pain control.  HEP developed this session with exercises focused on postural strengthening and stretches to address UT and hamstring tightness.  Pt able to demonstrate appropriate form and mechanics following initial cueing with no reports of pain.    Eval:  Patient is a 67 y.o. male who was seen today for physical therapy evaluation and treatment for spondylosis of the thoracic region. Patient's condition is further defined by difficulty with prolonged sitting, bending, and lifting due to pain, weakness, and decreased soft tissue extensibility. Skilled PT is required to address the  impairments and functional limitations listed below.    OBJECTIVE IMPAIRMENTS: decreased ROM, decreased strength, hypomobility,  impaired flexibility, postural dysfunction, and pain.   ACTIVITY LIMITATIONS: carrying, lifting, bending, and sitting  PARTICIPATION LIMITATIONS: meal prep, cleaning, laundry, driving, shopping, community activity, and yard work  PERSONAL FACTORS: Time since onset of injury/illness/exacerbation are also affecting patient's functional outcome.   REHAB POTENTIAL: Good  CLINICAL DECISION MAKING: Stable/uncomplicated  EVALUATION COMPLEXITY: Low   GOALS: Goals reviewed with patient? Yes  SHORT TERM GOALS: Target date: 07/24/23  Pt will demonstrate indep in HEP to facilitate carry-over of skilled services and improve functional outcomes Goal status: IN PROGRESS  LONG TERM GOALS: Target date: 08/07/23  Pt will increase FOTO to at least 61 in order to demonstrate significant improvement in function related to  ADLs Baseline: 50.58 Goal status: IN PROGRESS  2.  Pt will demonstrate increase in thoracic extension and rotation ROM by 25%  to facilitate ease in ADLs  Baseline: see above Goal status: IN PROGRESS  3.  Patient will demonstrate increase in UE strength to 5/5 to facilitate ease in ADLs  Baseline: 4/5 Goal status: IN PROGRESS  4.  Pt will demonstrate increase in LE strength to 5/5 to facilitate ease and safety in ADLs  Baseline: 4+/5 Goal status: IN PROGRESS  PLAN:  PT FREQUENCY: 2x/week  PT DURATION: 4 weeks  PLANNED INTERVENTIONS: 97164- PT Re-evaluation, 97110-Therapeutic exercises, 97530- Therapeutic activity, 97112- Neuromuscular re-education, 97535- Self Care, 65784- Manual therapy, 97014- Electrical stimulation (unattended), Patient/Family education, Taping, Cryotherapy, and Moist heat.  PLAN FOR NEXT SESSION: Provide HEP. Begin thoracic mobility and strengthening activities as well as LE and core strengthening.  Becky Sax,  LPTA/CLT; CBIS [phone]  Juel Burrow, PTA 07/15/2023, 11:00 AM   07/15/2023, 11:00 AM

## 2023-07-17 ENCOUNTER — Ambulatory Visit (HOSPITAL_COMMUNITY): Payer: Medicare Other

## 2023-07-17 DIAGNOSIS — M546 Pain in thoracic spine: Secondary | ICD-10-CM | POA: Diagnosis not present

## 2023-07-17 NOTE — Therapy (Signed)
OUTPATIENT PHYSICAL THERAPY THORACOLUMBAR TREATMENT   Patient Name: Isaac Guerrero MRN: [id number] DOB:1956/04/19, 67 y.o., male Today's Date: 07/17/2023  END OF SESSION:  PT End of Session - 07/17/23 0825     Visit Number 3    Number of Visits 8    Date for PT Re-Evaluation 08/07/23    Authorization Type BCBS Medicare (no auth, no limit)    PT Start Time 0810   10 minutes late today   PT Stop Time 0845    PT Time Calculation (min) 35 min    Activity Tolerance Patient tolerated treatment well    Behavior During Therapy [facility] for tasks assessed/performed            Past Medical History:  Diagnosis Date   Anemia    Arthritis    oa   BPH (benign prostatic hyperplasia) 08/1989   GERD (gastroesophageal reflux disease) 11/29/1998   Hyperlipidemia 08/1997   Hypertension    Hypothyroidism 1983   Lesion of bladder    Lesion of bladder 02/28/2021   Pre-diabetes    Pulmonary nodules    Spermatocele    per Dr. Annabell Howells with URO   Urinary frequency 02/28/2021   and buring with urination   Past Surgical History:  Procedure Laterality Date   CATARACT EXTRACTION     left eye 03-2003 and right fall 2021   COLONOSCOPY  08/10/2009   polyp in ascending colon otherwise normal   COLONOSCOPY WITH ESOPHAGOGASTRODUODENOSCOPY (EGD) N/A 10/28/2013   Dr. Rourk:Schatzki's ring - not manipulated because no dysphagia currently. Small hiatal hernia.  Gastric and duodenal erosions of uncertain significance-status post biopsy. path with benign findings, no villous atrophy, no H.pylori/TCS:Colonic diverticulosis   CYSTOSCOPY  08/1989   normal   CYSTOSCOPY WITH BIOPSY N/A 02/07/2020   Procedure: CYSTOSCOPY WITH BLADDER AND PROSTATIS URETHRAL  BIOPSY WIHT FULGURATION AND INSTILL GEMCITABINE;  Surgeon: Bjorn Pippin, MD;  Location: [facility];  Service: Urology;  Laterality: N/A;   CYSTOSCOPY WITH BIOPSY Bilateral 08/09/2020   Procedure: CYSTOSCOPY WITH BLADDER BIOPSYAND FULGURATION  BILATERAL RETROGRADE S;  Surgeon: Bjorn Pippin, MD;  Location: [facility];  Service: Urology;  Laterality: Bilateral;   CYSTOSCOPY WITH BIOPSY N/A 03/05/2021   Procedure: CYSTOSCOPY WITH BIOPSY FULGURATION;  Surgeon: Bjorn Pippin, MD;  Location: [facility];  Service: Urology;  Laterality: N/A;   ESOPHAGOGASTRODUODENOSCOPY  10/1999   distal esoph. stricture; sliding H. H.   LASIK  04/12/2003   right eye   lumbar microdisectomy  2003   Dr. Gerlene Fee   Patient Active Problem List   Diagnosis Date Noted   Rhinitis, chronic 05/26/2022   Multiple pulmonary nodules determined by computed tomography of lung 04/10/2020   DOE (dyspnea on exertion) 04/10/2020   Seasonal and perennial allergic rhinitis 06/23/2017   Non-seasonal allergic rhinitis due to fungal spores 12/23/2016   Anemia 10/10/2013   Knee pain 05/14/2011   PALPITATIONS 07/21/2010   LOW BACK PAIN, CHRONIC 03/27/2009   Hypothyroidism 09/23/2007   HYPERLIPIDEMIA 09/23/2007   GERD 09/23/2007   BENIGN PROSTATIC HYPERTROPHY 09/23/2007   PSORIASIS 09/22/2007   HYPERGLYCEMIA 09/22/2007    PCP: Benita Stabile, MD  REFERRING PROVIDER: Adelfa Koh, MD  REFERRING DIAG: [id number] (ICD-10-CM) - Spondylosis without myelopathy or radiculopathy, thoracic region  Rationale for Evaluation and Treatment: Rehabilitation  THERAPY DIAG:  Pain in thoracic spine  ONSET DATE: 2019  SUBJECTIVE:  SUBJECTIVE STATEMENT: Doing well today. Patient states that he's sore today. Been doing his HEP without any issues. Patient is 10 minutes late today.  Eval:  Arrives to the clinic with c/o intermittent mid back pain (see below). Denies pain at the time of evaluation. Condition started in 2019 which gradually got worse without apparent reason. A month  ago, pain got really worse so patient decided to go to his MD. X-ray was done and revealed bone spurs and arthritis. Patient was given Prednisone and was instructed to take the medications when he starts PT. Patient was then referred to outpatient PT evaluation and management. Patient states that the back pain is getting better now since MD appointment.  PERTINENT HISTORY:  Hx of lumbar discectomy (2000)  PAIN:  Are you having pain? Yes: NPRS scale: 7/10 Pain location: mid back, radiates to the R side of the spine and just above the buttocks Pain description: tingling, intermittent Aggravating factors: Sitting for 3-4 hours, bending, driving, lifting Relieving factors: laying down flat, Tramadol  PRECAUTIONS: None  RED FLAGS: Bowel or bladder incontinence: No, Cauda equina syndrome: No, and Compression fracture: No   WEIGHT BEARING RESTRICTIONS: No  FALLS:  Has patient fallen in last 6 months? No  LIVING ENVIRONMENT: Lives with: lives with their spouse Lives in: House/apartment Stairs: Yes: External: 2 steps; none Has following equipment at home: None  OCCUPATION: retired  PLOF: Independent and Independent with basic ADLs  PATIENT GOALS: "just to get better"  NEXT MD VISIT: PRN  OBJECTIVE:  Note: Objective measures were completed at Evaluation unless otherwise noted.  DIAGNOSTIC FINDINGS:  None done recently  PATIENT SURVEYS:  FOTO 50.58  SCREENING FOR RED FLAGS: Bowel or bladder incontinence: No Spinal tumors: No Cauda equina syndrome: No Compression fracture: No  COGNITION: Overall cognitive status: Within functional limits for tasks assessed     SENSATION: Light touch: WFL on B LE  MUSCLE LENGTH: Hamstrings: moderate restriction on B Thomas test: mild restriction on B hip flexors Piriformis: mild restriction on B Pectoralis: moderate restriction on B Upper trapezius: moderate restriction on B  POSTURE: rounded shoulders, forward head, decreased  lumbar lordosis, and increased thoracic kyphosis  PALPATION: No tenderness on paraspinals and major bony landmarks of the thoracic spine  THORACIC ROM:   AROM eval  Flexion 75%  Extension 25%  Right lateral flexion   Left lateral flexion   Right rotation 50%  Left rotation 50%   (Blank rows = not tested)  LOWER EXTREMITY ROM:     Active  Right eval Left eval  Hip flexion [facility]  Hip extension [facility]  Hip abduction [facility]  Hip adduction    Hip internal rotation    Hip external rotation    Knee flexion [facility]  Knee extension [facility]  Ankle dorsiflexion [facility] WFL  Ankle plantarflexion [facility] WFL  Ankle inversion    Ankle eversion     (Blank rows = not tested)  UPPER EXTREMITY MMT:  MMT Right eval Left eval  Shoulder flexion 5 5  Shoulder abduction 5 5  Shoulder internal rotation 5 5  Shoulder external rotation 5 5  Middle trapezius 4 4  Elbow flexion 5 5  Elbow extension 5 5  Grip 5 5   (Blank rows = not tested)  LOWER EXTREMITY MMT:    MMT Right eval Left eval  Hip flexion 4+ 4+  Hip extension 4+ 4+  Hip abduction 4+ 4+  Hip adduction    Hip  internal rotation    Hip external rotation    Knee flexion 5 5  Knee extension 5 5  Ankle dorsiflexion 5 5  Ankle plantarflexion 5 5  Ankle inversion    Ankle eversion     (Blank rows = not tested)  LUMBAR SPECIAL TESTS:  Straight leg raise test: positive on B for hamstring tightness  FUNCTIONAL TESTS:  5 times sit to stand: 8.46 sec 2 minute walk test: 523 ft  GAIT: Distance walked: 523 Assistive device utilized: None Level of assistance: Complete Independence Comments: done during , no gait deviations seen  TODAY'S TREATMENT:                                                                                                                              DATE:  07/17/23 UBE, level 1, forward, 5' Doorway pec strectch 60 deg abd x 30" x 3 Seated abdominal press with physioball x 3" x 10 x  2 Sidelying open book with cervical tracking x 10 on each side Foam roller series with 1 pillow on back (hugs, snow angels x swimmers) x 20 each Baby eagle stretch x 30" x 2   07/15/23: Reviewed goals Educated importance of HEP compliance for maximal benefits   Seated: 3D thoracic excursion 5x each with UE overhead Discussed seated posture Lumbar support Wback 10x X to V 10x Cervical retraction 10x. Upper trap stretch 2x 30"  Supine:: Hamstring stretch 2x 30" (1 with hands behind knees, 1 with rope)  Standing: 3D hip excursion10x (lateral weight shift, rotation, squat front of chair and lumbar extension)  07/10/23 Evaluation and patient education done    PATIENT EDUCATION:  Education details: Educated on the pathoanatomy of thoracic pain. Educated on the goals and course of rehab.  Person educated: Patient Education method: Explanation Education comprehension: verbalized understanding  HOME EXERCISE PROGRAM: 07/17/23 Access Code: [email or web address] URL: [email or web address] - Doorway Pec Stretch at 60 Degrees Abduction with Arm Straight  - 1 x daily - 7 x weekly - 3 reps - 30 hold - Sidelying Thoracic Rotation with Open Book  - 1 x daily - 7 x weekly - 10 reps - Snow Angels on Foam Roll  - 1 x daily - 7 x weekly - 20 reps - Thoracic Foam Roll Mobilization Backstroke  - 1 x daily - 7 x weekly - 20 reps - Thoracic Foam Roll Mobilization Hug  - 1 x daily - 7 x weekly - 20 reps - Seated Abdominal Press into [employer]  - 1-2 x daily - 7 x weekly - 2 sets - 10 reps - 3 hold - Baby Eagle   - 1 x daily - 7 x weekly - 2 reps - 30 hold  07/15/23:  3D thoracic excursion Access Code: [email or web address] URL: [email or web address] Date: 07/15/2023 Prepared by: Becky Sax  Exercises - Seated Scapular Retraction with External Rotation  - 1 x daily - 7 x weekly - 3 sets - 10  reps - Seated Passive Cervical Retraction  - 1 x daily - 7 x weekly - 3 sets - 10 reps -  Seated Upper Trapezius Stretch  - 2 x daily - 7 x weekly - 3 sets - 3 reps - 30" hold - Supine Hamstring Stretch  - 1-2 x daily - 7 x weekly - 1 sets - 3 reps - 30" hold   ASSESSMENT:  CLINICAL IMPRESSION: Interventions today were geared towards thoracic mobility and core strengthening. Tolerated all activities without worsening of symptoms. Demonstrated appropriate levels of fatigue. Provided slight amount of cueing to ensure correct execution of activity with good carry-over. To date, skilled PT is required to address the impairments and improve function.   Eval:  Patient is a 67 y.o. male who was seen today for physical therapy evaluation and treatment for spondylosis of the thoracic region. Patient's condition is further defined by difficulty with prolonged sitting, bending, and lifting due to pain, weakness, and decreased soft tissue extensibility. Skilled PT is required to address the impairments and functional limitations listed below.    OBJECTIVE IMPAIRMENTS: decreased ROM, decreased strength, hypomobility, impaired flexibility, postural dysfunction, and pain.   ACTIVITY LIMITATIONS: carrying, lifting, bending, and sitting  PARTICIPATION LIMITATIONS: meal prep, cleaning, laundry, driving, shopping, community activity, and yard work  PERSONAL FACTORS: Time since onset of injury/illness/exacerbation are also affecting patient's functional outcome.   REHAB POTENTIAL: Good  CLINICAL DECISION MAKING: Stable/uncomplicated  EVALUATION COMPLEXITY: Low   GOALS: Goals reviewed with patient? Yes  SHORT TERM GOALS: Target date: 07/24/23  Pt will demonstrate indep in HEP to facilitate carry-over of skilled services and improve functional outcomes Goal status: IN PROGRESS  LONG TERM GOALS: Target date: 08/07/23  Pt will increase FOTO to at least 61 in order to demonstrate significant improvement in function related to  ADLs Baseline: 50.58 Goal status: IN PROGRESS  2.  Pt will  demonstrate increase in thoracic extension and rotation ROM by 25%  to facilitate ease in ADLs  Baseline: see above Goal status: IN PROGRESS  3.  Patient will demonstrate increase in UE strength to 5/5 to facilitate ease in ADLs  Baseline: 4/5 Goal status: IN PROGRESS  4.  Pt will demonstrate increase in LE strength to 5/5 to facilitate ease and safety in ADLs  Baseline: 4+/5 Goal status: IN PROGRESS  PLAN:  PT FREQUENCY: 2x/week  PT DURATION: 4 weeks  PLANNED INTERVENTIONS: 97164- PT Re-evaluation, 97110-Therapeutic exercises, 97530- Therapeutic activity, 97112- Neuromuscular re-education, 97535- Self Care, 40981- Manual therapy, 97014- Electrical stimulation (unattended), Patient/Family education, Taping, Cryotherapy, and Moist heat.  PLAN FOR NEXT SESSION: Continue POC and may progress as tolerated with emphasis on thoracic mobility and strengthening activities as well as LE and core strengthening.  Tish Frederickson. Derrion Tritz, PT, DPT, OCS Board-Certified Clinical Specialist in Orthopedic PT PT Compact Privilege # ([place]): [id number]  07/17/2023, 8:33 AM

## 2023-07-21 ENCOUNTER — Ambulatory Visit (HOSPITAL_COMMUNITY): Payer: Medicare Other

## 2023-07-21 DIAGNOSIS — M546 Pain in thoracic spine: Secondary | ICD-10-CM

## 2023-07-21 NOTE — Therapy (Signed)
OUTPATIENT PHYSICAL THERAPY THORACOLUMBAR TREATMENT   Patient Name: AYHAN HEMP MRN: [id number] DOB:02-21-1956, 67 y.o., male Today's Date: 07/21/2023  END OF SESSION:  PT End of Session - 07/21/23 1007     Visit Number 4    Number of Visits 8    Date for PT Re-Evaluation 08/07/23    Authorization Type BCBS Medicare (no auth, no limit)    PT Start Time 1015    PT Stop Time 1055    PT Time Calculation (min) 40 min    Activity Tolerance Patient tolerated treatment well    Behavior During Therapy WFL for tasks assessed/performed             Past Medical History:  Diagnosis Date   Anemia    Arthritis    oa   BPH (benign prostatic hyperplasia) 08/1989   GERD (gastroesophageal reflux disease) 11/29/1998   Hyperlipidemia 08/1997   Hypertension    Hypothyroidism 1983   Lesion of bladder    Lesion of bladder 02/28/2021   Pre-diabetes    Pulmonary nodules    Spermatocele    per Dr. Annabell Howells with URO   Urinary frequency 02/28/2021   and buring with urination   Past Surgical History:  Procedure Laterality Date   CATARACT EXTRACTION     left eye 03-2003 and right fall 2021   COLONOSCOPY  08/10/2009   polyp in ascending colon otherwise normal   COLONOSCOPY WITH ESOPHAGOGASTRODUODENOSCOPY (EGD) N/A 10/28/2013   Dr. Rourk:Schatzki's ring - not manipulated because no dysphagia currently. Small hiatal hernia.  Gastric and duodenal erosions of uncertain significance-status post biopsy. path with benign findings, no villous atrophy, no H.pylori/TCS:Colonic diverticulosis   CYSTOSCOPY  08/1989   normal   CYSTOSCOPY WITH BIOPSY N/A 02/07/2020   Procedure: CYSTOSCOPY WITH BLADDER AND PROSTATIS URETHRAL  BIOPSY WIHT FULGURATION AND INSTILL GEMCITABINE;  Surgeon: Bjorn Pippin, MD;  Location: [facility];  Service: Urology;  Laterality: N/A;   CYSTOSCOPY WITH BIOPSY Bilateral 08/09/2020   Procedure: CYSTOSCOPY WITH BLADDER BIOPSYAND FULGURATION BILATERAL RETROGRADE S;   Surgeon: Bjorn Pippin, MD;  Location: [facility];  Service: Urology;  Laterality: Bilateral;   CYSTOSCOPY WITH BIOPSY N/A 03/05/2021   Procedure: CYSTOSCOPY WITH BIOPSY FULGURATION;  Surgeon: Bjorn Pippin, MD;  Location: [facility];  Service: Urology;  Laterality: N/A;   ESOPHAGOGASTRODUODENOSCOPY  10/1999   distal esoph. stricture; sliding H. H.   LASIK  04/12/2003   right eye   lumbar microdisectomy  2003   Dr. Gerlene Fee   Patient Active Problem List   Diagnosis Date Noted   Rhinitis, chronic 05/26/2022   Multiple pulmonary nodules determined by computed tomography of lung 04/10/2020   DOE (dyspnea on exertion) 04/10/2020   Seasonal and perennial allergic rhinitis 06/23/2017   Non-seasonal allergic rhinitis due to fungal spores 12/23/2016   Anemia 10/10/2013   Knee pain 05/14/2011   PALPITATIONS 07/21/2010   LOW BACK PAIN, CHRONIC 03/27/2009   Hypothyroidism 09/23/2007   HYPERLIPIDEMIA 09/23/2007   GERD 09/23/2007   BENIGN PROSTATIC HYPERTROPHY 09/23/2007   PSORIASIS 09/22/2007   HYPERGLYCEMIA 09/22/2007    PCP: Benita Stabile, MD  REFERRING PROVIDER: Adelfa Koh, MD  REFERRING DIAG: [phone] (ICD-10-CM) - Spondylosis without myelopathy or radiculopathy, thoracic region  Rationale for Evaluation and Treatment: Rehabilitation  THERAPY DIAG:  Pain in thoracic spine  ONSET DATE: 2019  SUBJECTIVE:  SUBJECTIVE STATEMENT: Doing well today and denies any pain. Patient reports that he was okay after the last session. Been doing his HEP without any issues.   Eval:  Arrives to the clinic with c/o intermittent mid back pain (see below). Denies pain at the time of evaluation. Condition started in 2019 which gradually got worse without apparent reason. A month ago, pain got really  worse so patient decided to go to his MD. X-ray was done and revealed bone spurs and arthritis. Patient was given Prednisone and was instructed to take the medications when he starts PT. Patient was then referred to outpatient PT evaluation and management. Patient states that the back pain is getting better now since MD appointment.  PERTINENT HISTORY:  Hx of lumbar discectomy (2000)  PAIN:  Are you having pain? Yes: NPRS scale: 7/10 Pain location: mid back, radiates to the R side of the spine and just above the buttocks Pain description: tingling, intermittent Aggravating factors: Sitting for 3-4 hours, bending, driving, lifting Relieving factors: laying down flat, Tramadol  PRECAUTIONS: None  RED FLAGS: Bowel or bladder incontinence: No, Cauda equina syndrome: No, and Compression fracture: No   WEIGHT BEARING RESTRICTIONS: No  FALLS:  Has patient fallen in last 6 months? No  LIVING ENVIRONMENT: Lives with: lives with their spouse Lives in: House/apartment Stairs: Yes: External: 2 steps; none Has following equipment at home: None  OCCUPATION: retired  PLOF: Independent and Independent with basic ADLs  PATIENT GOALS: "just to get better"  NEXT MD VISIT: PRN  OBJECTIVE:  Note: Objective measures were completed at Evaluation unless otherwise noted.  DIAGNOSTIC FINDINGS:  None done recently  PATIENT SURVEYS:  FOTO 50.58  SCREENING FOR RED FLAGS: Bowel or bladder incontinence: No Spinal tumors: No Cauda equina syndrome: No Compression fracture: No  COGNITION: Overall cognitive status: Within functional limits for tasks assessed     SENSATION: Light touch: WFL on B LE  MUSCLE LENGTH: Hamstrings: moderate restriction on B Thomas test: mild restriction on B hip flexors Piriformis: mild restriction on B Pectoralis: moderate restriction on B Upper trapezius: moderate restriction on B  POSTURE: rounded shoulders, forward head, decreased lumbar lordosis, and  increased thoracic kyphosis  PALPATION: No tenderness on paraspinals and major bony landmarks of the thoracic spine  THORACIC ROM:   AROM eval  Flexion 75%  Extension 25%  Right lateral flexion   Left lateral flexion   Right rotation 50%  Left rotation 50%   (Blank rows = not tested)  LOWER EXTREMITY ROM:     Active  Right eval Left eval  Hip flexion [facility]  Hip extension [facility]  Hip abduction [facility]  Hip adduction    Hip internal rotation    Hip external rotation    Knee flexion [facility]  Knee extension [facility]  Ankle dorsiflexion [facility] WFL  Ankle plantarflexion [facility] WFL  Ankle inversion    Ankle eversion     (Blank rows = not tested)  UPPER EXTREMITY MMT:  MMT Right eval Left eval  Shoulder flexion 5 5  Shoulder abduction 5 5  Shoulder internal rotation 5 5  Shoulder external rotation 5 5  Middle trapezius 4 4  Elbow flexion 5 5  Elbow extension 5 5  Grip 5 5   (Blank rows = not tested)  LOWER EXTREMITY MMT:    MMT Right eval Left eval  Hip flexion 4+ 4+  Hip extension 4+ 4+  Hip abduction 4+ 4+  Hip adduction  Hip internal rotation    Hip external rotation    Knee flexion 5 5  Knee extension 5 5  Ankle dorsiflexion 5 5  Ankle plantarflexion 5 5  Ankle inversion    Ankle eversion     (Blank rows = not tested)  LUMBAR SPECIAL TESTS:  Straight leg raise test: positive on B for hamstring tightness  FUNCTIONAL TESTS:  5 times sit to stand: 8.46 sec 2 minute walk test: 523 ft  GAIT: Distance walked: 523 Assistive device utilized: None Level of assistance: Complete Independence Comments: done during , no gait deviations seen  TODAY'S TREATMENT:                                                                                                                              DATE:  07/21/23 UBE, level 1, forward/backward, 2.5' each (5' total) Standing I, T, Y exercises, RTB x 10 x 2 Lower trapezius setting on the wall, RTB x 10 x  2 Doorway pec strectch 60 deg abd x 30" x 3 Seated abdominal press with physioball x 3" x 10 x 2 Seated marches, neutral spine x 10 x 2 Seated alt knee ext, neutral spine x 10 x 2 Seated rows, neutral spine, RTB x 10 x 2 Sidelying open book with cervical tracking x 10 on each side Foam roller series with 1 pillow on back (hugs, snow angels x swimmers) x 10 each Cat and cow x 10 x 2 Baby eagle stretch x 30" x 2  07/17/23 UBE, level 1, forward, 5' Doorway pec strectch 60 deg abd x 30" x 3 Seated abdominal press with physioball x 3" x 10 x 2 Sidelying open book with cervical tracking x 10 on each side Foam roller series with 1 pillow on back (hugs, snow angels x swimmers) x 20 each Baby eagle stretch x 30" x 2   07/15/23: Reviewed goals Educated importance of HEP compliance for maximal benefits   Seated: 3D thoracic excursion 5x each with UE overhead Discussed seated posture Lumbar support Wback 10x X to V 10x Cervical retraction 10x. Upper trap stretch 2x 30"  Supine:: Hamstring stretch 2x 30" (1 with hands behind knees, 1 with rope)  Standing: 3D hip excursion10x (lateral weight shift, rotation, squat front of chair and lumbar extension)  07/10/23 Evaluation and patient education done    PATIENT EDUCATION:  Education details: Educated on the pathoanatomy of thoracic pain. Educated on the goals and course of rehab.  Person educated: Patient Education method: Explanation Education comprehension: verbalized understanding  HOME EXERCISE PROGRAM: Access Code: [email or web address] URL: [email or web address] 07/21/23 - Shoulder Extension with Resistance  - 1 x daily - 7 x weekly - 2 sets - 10 reps - Standing Shoulder Horizontal Abduction with Anchored Resistance  - 1 x daily - 7 x weekly - 2 sets - 10 reps - Standing Low Trap Setting with Resistance at Wall  - 1 x daily - 7 x weekly -  2 sets - 10 reps - Cat Cow  - 1 x daily - 7 x weekly - 10 reps - Seated March  - 1  x daily - 7 x weekly - 2 sets - 10 reps - Seated Shoulder Row with Anchored Resistance  - 1 x daily - 7 x weekly - 2 sets - 10 reps  07/17/23 - Doorway Pec Stretch at 60 Degrees Abduction with Arm Straight  - 1 x daily - 7 x weekly - 3 reps - 30 hold - Sidelying Thoracic Rotation with Open Book  - 1 x daily - 7 x weekly - 10 reps - [employer] on Foam Roll  - 1 x daily - 7 x weekly - 20 reps - Thoracic Foam Roll Mobilization Backstroke  - 1 x daily - 7 x weekly - 20 reps - Thoracic Foam Roll Mobilization Hug  - 1 x daily - 7 x weekly - 20 reps - Seated Abdominal Press into [employer]  - 1-2 x daily - 7 x weekly - 2 sets - 10 reps - 3 hold - Baby Eagle   - 1 x daily - 7 x weekly - 2 reps - 30 hold  07/15/23:  3D thoracic excursion Access Code: [email or web address] URL: [email or web address] Date: 07/15/2023 Prepared by: Becky Sax  Exercises - Seated Scapular Retraction with External Rotation  - 1 x daily - 7 x weekly - 3 sets - 10 reps - Seated Passive Cervical Retraction  - 1 x daily - 7 x weekly - 3 sets - 10 reps - Seated Upper Trapezius Stretch  - 2 x daily - 7 x weekly - 3 sets - 3 reps - 30" hold - Supine Hamstring Stretch  - 1-2 x daily - 7 x weekly - 1 sets - 3 reps - 30" hold   ASSESSMENT:  CLINICAL IMPRESSION: Interventions today were geared towards thoracic mobility and core strengthening. Tolerated all activities without worsening of symptoms (no pain reported). Demonstrated appropriate levels of fatigue. Provided slight amount of cueing to ensure correct execution of activity with good carry-over. To date, skilled PT is required to address the impairments and improve function.   Eval:  Patient is a 67 y.o. male who was seen today for physical therapy evaluation and treatment for spondylosis of the thoracic region. Patient's condition is further defined by difficulty with prolonged sitting, bending, and lifting due to pain, weakness, and decreased soft tissue  extensibility. Skilled PT is required to address the impairments and functional limitations listed below.    OBJECTIVE IMPAIRMENTS: decreased ROM, decreased strength, hypomobility, impaired flexibility, postural dysfunction, and pain.   ACTIVITY LIMITATIONS: carrying, lifting, bending, and sitting  PARTICIPATION LIMITATIONS: meal prep, cleaning, laundry, driving, shopping, community activity, and yard work  PERSONAL FACTORS: Time since onset of injury/illness/exacerbation are also affecting patient's functional outcome.   REHAB POTENTIAL: Good  CLINICAL DECISION MAKING: Stable/uncomplicated  EVALUATION COMPLEXITY: Low   GOALS: Goals reviewed with patient? Yes  SHORT TERM GOALS: Target date: 07/24/23  Pt will demonstrate indep in HEP to facilitate carry-over of skilled services and improve functional outcomes Goal status: IN PROGRESS  LONG TERM GOALS: Target date: 08/07/23  Pt will increase FOTO to at least 61 in order to demonstrate significant improvement in function related to  ADLs Baseline: 50.58 Goal status: IN PROGRESS  2.  Pt will demonstrate increase in thoracic extension and rotation ROM by 25%  to facilitate ease in ADLs  Baseline: see above Goal status: IN PROGRESS  3.  Patient will demonstrate increase in UE strength to 5/5 to facilitate ease in ADLs  Baseline: 4/5 Goal status: IN PROGRESS  4.  Pt will demonstrate increase in LE strength to 5/5 to facilitate ease and safety in ADLs  Baseline: 4+/5 Goal status: IN PROGRESS  PLAN:  PT FREQUENCY: 2x/week  PT DURATION: 4 weeks  PLANNED INTERVENTIONS: 97164- PT Re-evaluation, 97110-Therapeutic exercises, 97530- Therapeutic activity, 97112- Neuromuscular re-education, 97535- Self Care, 14782- Manual therapy, 97014- Electrical stimulation (unattended), Patient/Family education, Taping, Cryotherapy, and Moist heat.  PLAN FOR NEXT SESSION: Continue POC and may progress as tolerated with emphasis on thoracic  mobility and strengthening activities as well as LE and core strengthening.  Tish Frederickson. [place] Wanner, PT, DPT, OCS Board-Certified Clinical Specialist in Orthopedic PT PT Compact Privilege # ([place]): [id number]  07/21/2023, 10:14 AM

## 2023-07-24 ENCOUNTER — Ambulatory Visit (HOSPITAL_COMMUNITY): Payer: Medicare Other

## 2023-07-24 DIAGNOSIS — M546 Pain in thoracic spine: Secondary | ICD-10-CM

## 2023-07-24 NOTE — Therapy (Signed)
OUTPATIENT PHYSICAL THERAPY THORACOLUMBAR TREATMENT   Patient Name: Isaac Guerrero MRN: [id number] DOB:Jan 27, 1956, 67 y.o., male Today's Date: 07/24/2023  END OF SESSION:  PT End of Session - 07/24/23 0937     Visit Number 5    Number of Visits 8    Date for PT Re-Evaluation 08/07/23    Authorization Type BCBS Medicare (no auth, no limit)    PT Start Time 0930    PT Stop Time 1010    PT Time Calculation (min) 40 min    Activity Tolerance Patient tolerated treatment well    Behavior During Therapy [facility] for tasks assessed/performed            Past Medical History:  Diagnosis Date   Anemia    Arthritis    oa   BPH (benign prostatic hyperplasia) 08/1989   GERD (gastroesophageal reflux disease) 11/29/1998   Hyperlipidemia 08/1997   Hypertension    Hypothyroidism 1983   Lesion of bladder    Lesion of bladder 02/28/2021   Pre-diabetes    Pulmonary nodules    Spermatocele    per Dr. Annabell Howells with URO   Urinary frequency 02/28/2021   and buring with urination   Past Surgical History:  Procedure Laterality Date   CATARACT EXTRACTION     left eye 03-2003 and right fall 2021   COLONOSCOPY  08/10/2009   polyp in ascending colon otherwise normal   COLONOSCOPY WITH ESOPHAGOGASTRODUODENOSCOPY (EGD) N/A 10/28/2013   Dr. Rourk:Schatzki's ring - not manipulated because no dysphagia currently. Small hiatal hernia.  Gastric and duodenal erosions of uncertain significance-status post biopsy. path with benign findings, no villous atrophy, no H.pylori/TCS:Colonic diverticulosis   CYSTOSCOPY  08/1989   normal   CYSTOSCOPY WITH BIOPSY N/A 02/07/2020   Procedure: CYSTOSCOPY WITH BLADDER AND PROSTATIS URETHRAL  BIOPSY WIHT FULGURATION AND INSTILL GEMCITABINE;  Surgeon: Bjorn Pippin, MD;  Location: [facility];  Service: Urology;  Laterality: N/A;   CYSTOSCOPY WITH BIOPSY Bilateral 08/09/2020   Procedure: CYSTOSCOPY WITH BLADDER BIOPSYAND FULGURATION BILATERAL RETROGRADE S;   Surgeon: Bjorn Pippin, MD;  Location: [facility];  Service: Urology;  Laterality: Bilateral;   CYSTOSCOPY WITH BIOPSY N/A 03/05/2021   Procedure: CYSTOSCOPY WITH BIOPSY FULGURATION;  Surgeon: Bjorn Pippin, MD;  Location[facility];  Service: Urology;  Laterality: N/A;   ESOPHAGOGASTRODUODENOSCOPY  10/1999   distal esoph. stricture; sliding H. H.   LASIK  04/12/2003   right eye   lumbar microdisectomy  2003   Dr. Gerlene Fee   Patient Active Problem List   Diagnosis Date Noted   Rhinitis, chronic 05/26/2022   Multiple pulmonary nodules determined by computed tomography of lung 04/10/2020   DOE (dyspnea on exertion) 04/10/2020   Seasonal and perennial allergic rhinitis 06/23/2017   Non-seasonal allergic rhinitis due to fungal spores 12/23/2016   Anemia 10/10/2013   Knee pain 05/14/2011   PALPITATIONS 07/21/2010   LOW BACK PAIN, CHRONIC 03/27/2009   Hypothyroidism 09/23/2007   HYPERLIPIDEMIA 09/23/2007   GERD 09/23/2007   BENIGN PROSTATIC HYPERTROPHY 09/23/2007   PSORIASIS 09/22/2007   HYPERGLYCEMIA 09/22/2007    PCP: Benita Stabile, MD  REFERRING PROVIDER: Adelfa Koh, MD  REFERRING DIAG: [phone] (ICD-10-CM) - Spondylosis without myelopathy or radiculopathy, thoracic region  Rationale for Evaluation and Treatment: Rehabilitation  THERAPY DIAG:  Pain in thoracic spine  ONSET DATE: 2019  SUBJECTIVE:  SUBJECTIVE STATEMENT: Doing well today and denies any pain. Patient reports that he was okay after the last session. Patient reports that he has been doing his HEP without any problem  Eval:  Arrives to the clinic with c/o intermittent mid back pain (see below). Denies pain at the time of evaluation. Condition started in 2019 which gradually got worse without apparent reason. A  month ago, pain got really worse so patient decided to go to his MD. X-ray was done and revealed bone spurs and arthritis. Patient was given Prednisone and was instructed to take the medications when he starts PT. Patient was then referred to outpatient PT evaluation and management. Patient states that the back pain is getting better now since MD appointment.  PERTINENT HISTORY:  Hx of lumbar discectomy (2000)  PAIN:  Are you having pain? Yes: NPRS scale: 7/10 Pain location: mid back, radiates to the R side of the spine and just above the buttocks Pain description: tingling, intermittent Aggravating factors: Sitting for 3-4 hours, bending, driving, lifting Relieving factors: laying down flat, Tramadol  PRECAUTIONS: None  RED FLAGS: Bowel or bladder incontinence: No, Cauda equina syndrome: No, and Compression fracture: No   WEIGHT BEARING RESTRICTIONS: No  FALLS:  Has patient fallen in last 6 months? No  LIVING ENVIRONMENT: Lives with: lives with their spouse Lives in: House/apartment Stairs: Yes: External: 2 steps; none Has following equipment at home: None  OCCUPATION: retired  PLOF: Independent and Independent with basic ADLs  PATIENT GOALS: "just to get better"  NEXT MD VISIT: PRN  OBJECTIVE:  Note: Objective measures were completed at Evaluation unless otherwise noted.  DIAGNOSTIC FINDINGS:  None done recently  PATIENT SURVEYS:  FOTO 50.58  SCREENING FOR RED FLAGS: Bowel or bladder incontinence: No Spinal tumors: No Cauda equina syndrome: No Compression fracture: No  COGNITION: Overall cognitive status: Within functional limits for tasks assessed     SENSATION: Light touch: WFL on B LE  MUSCLE LENGTH: Hamstrings: moderate restriction on B Thomas test: mild restriction on B hip flexors Piriformis: mild restriction on B Pectoralis: moderate restriction on B Upper trapezius: moderate restriction on B  POSTURE: rounded shoulders, forward head, decreased  lumbar lordosis, and increased thoracic kyphosis  PALPATION: No tenderness on paraspinals and major bony landmarks of the thoracic spine  THORACIC ROM:   AROM eval  Flexion 75%  Extension 25%  Right lateral flexion   Left lateral flexion   Right rotation 50%  Left rotation 50%   (Blank rows = not tested)  LOWER EXTREMITY ROM:     Active  Right eval Left eval  Hip flexion [facility]  Hip extension [facility]  Hip abduction [facility]  Hip adduction    Hip internal rotation    Hip external rotation    Knee flexion [facility]  Knee extension [facility]  Ankle dorsiflexion [facility] WFL  Ankle plantarflexion [facility] WFL  Ankle inversion    Ankle eversion     (Blank rows = not tested)  UPPER EXTREMITY MMT:  MMT Right eval Left eval  Shoulder flexion 5 5  Shoulder abduction 5 5  Shoulder internal rotation 5 5  Shoulder external rotation 5 5  Middle trapezius 4 4  Elbow flexion 5 5  Elbow extension 5 5  Grip 5 5   (Blank rows = not tested)  LOWER EXTREMITY MMT:    MMT Right eval Left eval  Hip flexion 4+ 4+  Hip extension 4+ 4+  Hip abduction 4+ 4+  Hip adduction    Hip internal rotation    Hip external rotation    Knee flexion 5 5  Knee extension 5 5  Ankle dorsiflexion 5 5  Ankle plantarflexion 5 5  Ankle inversion    Ankle eversion     (Blank rows = not tested)  LUMBAR SPECIAL TESTS:  Straight leg raise test: positive on B for hamstring tightness  FUNCTIONAL TESTS:  5 times sit to stand: 8.46 sec 2 minute walk test: 523 ft  GAIT: Distance walked: 523 Assistive device utilized: None Level of assistance: Complete Independence Comments: done during , no gait deviations seen  TODAY'S TREATMENT:                                                                                                                              DATE:  07/24/23 UBE, level 2, forward/backward, 2.5' each (5' total) Standing I, T exercises, GTB x 3" x 10 x 2 Standing Y exercises, GTB x 10 x  2 Pallof press, GTB x 10 x 2 Doorway pec stretch 60 deg abd x 30" x 3 Seated thoracic ext with a half foam roll x 10 x 2 Seated abdominal press with physioball x 3" x 10 x 2 Seated marches, neutral spine x 10 x 2 x 2 lbs Seated alt knee ext, neutral spine x 10 x 2 x 2 lbs Seated rows, neutral spine, GTB x 10 x 2 Sidelying open book with cervical tracking x 10 on each side Foam roller series with 1 pillow on back (hugs, snow angels x swimmers) x 10 each Cat and cow x 10 x 2 Threading the needle x 10 on each  07/21/23 UBE, level 1, forward/backward, 2.5' each (5' total) Standing I, T, Y exercises, RTB x 10 x 2 Lower trapezius setting on the wall, RTB x 10 x 2 Doorway pec strectch 60 deg abd x 30" x 3 Seated abdominal press with physioball x 3" x 10 x 2 Seated marches, neutral spine x 10 x 2 Seated alt knee ext, neutral spine x 10 x 2 Seated rows, neutral spine, RTB x 10 x 2 Sidelying open book with cervical tracking x 10 on each side Foam roller series with 1 pillow on back (hugs, snow angels x swimmers) x 10 each Cat and cow x 10 x 2 Baby eagle stretch x 30" x 2  07/17/23 UBE, level 1, forward, 5' Doorway pec strectch 60 deg abd x 30" x 3 Seated abdominal press with physioball x 3" x 10 x 2 Sidelying open book with cervical tracking x 10 on each side Foam roller series with 1 pillow on back (hugs, snow angels x swimmers) x 20 each Baby eagle stretch x 30" x 2   07/15/23: Reviewed goals Educated importance of HEP compliance for maximal benefits   Seated: 3D thoracic excursion 5x each with UE overhead Discussed seated posture Lumbar support Wback 10x X to V 10x Cervical retraction 10x.  Upper trap stretch 2x 30"  Supine:: Hamstring stretch 2x 30" (1 with hands behind knees, 1 with rope)  Standing: 3D hip excursion10x (lateral weight shift, rotation, squat front of chair and lumbar extension)  07/10/23 Evaluation and patient education done    PATIENT EDUCATION:   Education details: Educated on the pathoanatomy of thoracic pain. Educated on the goals and course of rehab.  Person educated: Patient Education method: Explanation Education comprehension: verbalized understanding  HOME EXERCISE PROGRAM: Access Code: [email or web address] URL: [email or web address] 07/21/23 - Shoulder Extension with Resistance  - 1 x daily - 7 x weekly - 2 sets - 10 reps - Standing Shoulder Horizontal Abduction with Anchored Resistance  - 1 x daily - 7 x weekly - 2 sets - 10 reps - Standing Low Trap Setting with Resistance at Wall  - 1 x daily - 7 x weekly - 2 sets - 10 reps - Cat Cow  - 1 x daily - 7 x weekly - 10 reps - Seated March  - 1 x daily - 7 x weekly - 2 sets - 10 reps - Seated Shoulder Row with Anchored Resistance  - 1 x daily - 7 x weekly - 2 sets - 10 reps  07/17/23 - Doorway Pec Stretch at 60 Degrees Abduction with Arm Straight  - 1 x daily - 7 x weekly - 3 reps - 30 hold - Sidelying Thoracic Rotation with Open Book  - 1 x daily - 7 x weekly - 10 reps - [employer] on Foam Roll  - 1 x daily - 7 x weekly - 20 reps - Thoracic Foam Roll Mobilization Backstroke  - 1 x daily - 7 x weekly - 20 reps - Thoracic Foam Roll Mobilization Hug  - 1 x daily - 7 x weekly - 20 reps - Seated Abdominal Press into [employer]  - 1-2 x daily - 7 x weekly - 2 sets - 10 reps - 3 hold - Baby Eagle   - 1 x daily - 7 x weekly - 2 reps - 30 hold  07/15/23:  3D thoracic excursion Access Code: [email or web address] URL: [email or web address] Date: 07/15/2023 Prepared by: Becky Sax  Exercises - Seated Scapular Retraction with External Rotation  - 1 x daily - 7 x weekly - 3 sets - 10 reps - Seated Passive Cervical Retraction  - 1 x daily - 7 x weekly - 3 sets - 10 reps - Seated Upper Trapezius Stretch  - 2 x daily - 7 x weekly - 3 sets - 3 reps - 30" hold - Supine Hamstring Stretch  - 1-2 x daily - 7 x weekly - 1 sets - 3 reps - 30" hold   ASSESSMENT:  CLINICAL  IMPRESSION: Interventions today were geared towards thoracic mobility and core strengthening. Still tolerated all activities without worsening of symptoms (no pain reported). Demonstrated appropriate levels of fatigue. Provided slight amount of cueing to ensure correct execution of activity with good carry-over. To date, skilled PT is required to address the impairments and improve function.   Eval:  Patient is a 67 y.o. male who was seen today for physical therapy evaluation and treatment for spondylosis of the thoracic region. Patient's condition is further defined by difficulty with prolonged sitting, bending, and lifting due to pain, weakness, and decreased soft tissue extensibility. Skilled PT is required to address the impairments and functional limitations listed below.    OBJECTIVE IMPAIRMENTS: decreased ROM, decreased strength, hypomobility, impaired flexibility, postural dysfunction, and pain.   ACTIVITY  LIMITATIONS: carrying, lifting, bending, and sitting  PARTICIPATION LIMITATIONS: meal prep, cleaning, laundry, driving, shopping, community activity, and yard work  PERSONAL FACTORS: Time since onset of injury/illness/exacerbation are also affecting patient's functional outcome.   REHAB POTENTIAL: Good  CLINICAL DECISION MAKING: Stable/uncomplicated  EVALUATION COMPLEXITY: Low   GOALS: Goals reviewed with patient? Yes  SHORT TERM GOALS: Target date: 07/24/23  Pt will demonstrate indep in HEP to facilitate carry-over of skilled services and improve functional outcomes Goal status: IN PROGRESS  LONG TERM GOALS: Target date: 08/07/23  Pt will increase FOTO to at least 61 in order to demonstrate significant improvement in function related to  ADLs Baseline: 50.58 Goal status: IN PROGRESS  2.  Pt will demonstrate increase in thoracic extension and rotation ROM by 25%  to facilitate ease in ADLs  Baseline: see above Goal status: IN PROGRESS  3.  Patient will demonstrate  increase in UE strength to 5/5 to facilitate ease in ADLs  Baseline: 4/5 Goal status: IN PROGRESS  4.  Pt will demonstrate increase in LE strength to 5/5 to facilitate ease and safety in ADLs  Baseline: 4+/5 Goal status: IN PROGRESS  PLAN:  PT FREQUENCY: 2x/week  PT DURATION: 4 weeks  PLANNED INTERVENTIONS: 97164- PT Re-evaluation, 97110-Therapeutic exercises, 97530- Therapeutic activity, 97112- Neuromuscular re-education, 97535- Self Care, 40981- Manual therapy, 97014- Electrical stimulation (unattended), Patient/Family education, Taping, Cryotherapy, and Moist heat.  PLAN FOR NEXT SESSION: Continue POC and may progress as tolerated with emphasis on thoracic mobility and strengthening activities as well as LE and core strengthening.  Tish Frederickson. Aubrey Blackard, PT, DPT, OCS Board-Certified Clinical Specialist in Orthopedic PT PT Compact Privilege # ([place]): [id number]  07/24/2023, 9:40 AM

## 2023-07-28 ENCOUNTER — Ambulatory Visit (HOSPITAL_COMMUNITY): Payer: Medicare Other

## 2023-07-28 ENCOUNTER — Encounter (HOSPITAL_COMMUNITY): Payer: Self-pay

## 2023-07-28 DIAGNOSIS — M546 Pain in thoracic spine: Secondary | ICD-10-CM | POA: Diagnosis not present

## 2023-07-28 NOTE — Therapy (Signed)
OUTPATIENT PHYSICAL THERAPY THORACOLUMBAR TREATMENT   Patient Name: Isaac Guerrero MRN: [id number] DOB:03-10-1956, 67 y.o., male Today's Date: 07/28/2023  END OF SESSION:  PT End of Session - 07/28/23 1149     Visit Number 6    Number of Visits 8    Date for PT Re-Evaluation 08/07/23    Authorization Type BC[employer] Medicare (no auth, no limit)    PT Start Time 1150    PT Stop Time 1230    PT Time Calculation (min) 40 min    Activity Tolerance Patient tolerated treatment well    Behavior During Therapy WFL for tasks assessed/performed            Past Medical History:  Diagnosis Date   Anemia    Arthritis    oa   BPH (benign prostatic hyperplasia) 08/1989   GERD (gastroesophageal reflux disease) 11/29/1998   Hyperlipidemia 08/1997   Hypertension    Hypothyroidism 1983   Lesion of bladder    Lesion of bladder 02/28/2021   Pre-diabetes    Pulmonary nodules    Spermatocele    per Dr. Annabell Howells with URO   Urinary frequency 02/28/2021   and buring with urination   Past Surgical History:  Procedure Laterality Date   CATARACT EXTRACTION     left eye 03-2003 and right fall 2021   COLONOSCOPY  08/10/2009   polyp in ascending colon otherwise normal   COLONOSCOPY WITH ESOPHAGOGASTRODUODENOSCOPY (EGD) N/A 10/28/2013   Dr. Rourk:Schatzki's ring - not manipulated because no dysphagia currently. Small hiatal hernia.  Gastric and duodenal erosions of uncertain significance-status post biopsy. path with benign findings, no villous atrophy, no H.pylori/TCS:Colonic diverticulosis   CYSTOSCOPY  08/1989   normal   CYSTOSCOPY WITH BIOPSY N/A 02/07/2020   Procedure: CYSTOSCOPY WITH BLADDER AND PROSTATIS URETHRAL  BIOPSY WIHT FULGURATION AND INSTILL GEMCITABINE;  Surgeon: Bjorn Pippin, MD;  Location: [facility];  Service: Urology;  Laterality: N/A;   CYSTOSCOPY WITH BIOPSY Bilateral 08/09/2020   Procedure: CYSTOSCOPY WITH BLADDER BIOPSYAND FULGURATION BILATERAL RETROGRADE S;   Surgeon: Bjorn Pippin, MD;  Location: [facility];  Service: Urology;  Laterality: Bilateral;   CYSTOSCOPY WITH BIOPSY N/A 03/05/2021   Procedure: CYSTOSCOPY WITH BIOPSY FULGURATION;  Surgeon: Bjorn Pippin, MD;  Location: [facility];  Service: Urology;  Laterality: N/A;   ESOPHAGOGASTRODUODENOSCOPY  10/1999   distal esoph. stricture; sliding H. H.   LASIK  04/12/2003   right eye   lumbar microdisectomy  2003   Dr. Gerlene Fee   Patient Active Problem List   Diagnosis Date Noted   Rhinitis, chronic 05/26/2022   Multiple pulmonary nodules determined by computed tomography of lung 04/10/2020   DOE (dyspnea on exertion) 04/10/2020   Seasonal and perennial allergic rhinitis 06/23/2017   Non-seasonal allergic rhinitis due to fungal spores 12/23/2016   Anemia 10/10/2013   Knee pain 05/14/2011   PALPITATIONS 07/21/2010   LOW BACK PAIN, CHRONIC 03/27/2009   Hypothyroidism 09/23/2007   HYPERLIPIDEMIA 09/23/2007   GERD 09/23/2007   BENIGN PROSTATIC HYPERTROPHY 09/23/2007   PSORIASIS 09/22/2007   HYPERGLYCEMIA 09/22/2007    PCP: Benita Stabile, MD  REFERRING PROVIDER: Adelfa Koh, MD  REFERRING DIAG: [phone] (ICD-10-CM) - Spondylosis without myelopathy or radiculopathy, thoracic region  Rationale for Evaluation and Treatment: Rehabilitation  THERAPY DIAG:  Pain in thoracic spine  ONSET DATE: 2019  SUBJECTIVE:  SUBJECTIVE STATEMENT: Reports he is feeling good today, no reports of pain currently.  Reports some pain yesterday while panting his bathroom that he is remodeling.  Eval:  Arrives to the clinic with c/o intermittent mid back pain (see below). Denies pain at the time of evaluation. Condition started in 2019 which gradually got worse without apparent reason. A month ago, pain  got really worse so patient decided to go to his MD. X-ray was done and revealed bone spurs and arthritis. Patient was given Prednisone and was instructed to take the medications when he starts PT. Patient was then referred to outpatient PT evaluation and management. Patient states that the back pain is getting better now since MD appointment.  PERTINENT HISTORY:  Hx of lumbar discectomy (2000)  PAIN:  Are you having pain? Yes: NPRS scale: 0/10 Pain location: mid back, radiates to the R side of the spine and just above the buttocks Pain description: tingling, intermittent Aggravating factors: Sitting for 3-4 hours, bending, driving, lifting Relieving factors: laying down flat, Tramadol  PRECAUTIONS: None  RED FLAGS: Bowel or bladder incontinence: No, Cauda equina syndrome: No, and Compression fracture: No   WEIGHT BEARING RESTRICTIONS: No  FALLS:  Has patient fallen in last 6 months? No  LIVING ENVIRONMENT: Lives with: lives with their spouse Lives in: House/apartment Stairs: Yes: External: 2 steps; none Has following equipment at home: None  OCCUPATION: retired  PLOF: Independent and Independent with basic ADLs  PATIENT GOALS: "just to get better"  NEXT MD VISIT: PRN  OBJECTIVE:  Note: Objective measures were completed at Evaluation unless otherwise noted.  DIAGNOSTIC FINDINGS:  None done recently  PATIENT SURVEYS:  FOTO 50.58  SCREENING FOR RED FLAGS: Bowel or bladder incontinence: No Spinal tumors: No Cauda equina syndrome: No Compression fracture: No  COGNITION: Overall cognitive status: Within functional limits for tasks assessed     SENSATION: Light touch: WFL on B LE  MUSCLE LENGTH: Hamstrings: moderate restriction on B Thomas test: mild restriction on B hip flexors Piriformis: mild restriction on B Pectoralis: moderate restriction on B Upper trapezius: moderate restriction on B  POSTURE: rounded shoulders, forward head, decreased lumbar  lordosis, and increased thoracic kyphosis  PALPATION: No tenderness on paraspinals and major bony landmarks of the thoracic spine  THORACIC ROM:   AROM eval  Flexion 75%  Extension 25%  Right lateral flexion   Left lateral flexion   Right rotation 50%  Left rotation 50%   (Blank rows = not tested)  LOWER EXTREMITY ROM:     Active  Right eval Left eval  Hip flexion [facility]  Hip extension [facility]  Hip abduction [facility]  Hip adduction    Hip internal rotation    Hip external rotation    Knee flexion [facility]  Knee extension [facility]  Ankle dorsiflexion [facility] WFL  Ankle plantarflexion [facility] WFL  Ankle inversion    Ankle eversion     (Blank rows = not tested)  UPPER EXTREMITY MMT:  MMT Right eval Left eval  Shoulder flexion 5 5  Shoulder abduction 5 5  Shoulder internal rotation 5 5  Shoulder external rotation 5 5  Middle trapezius 4 4  Elbow flexion 5 5  Elbow extension 5 5  Grip 5 5   (Blank rows = not tested)  LOWER EXTREMITY MMT:    MMT Right eval Left eval  Hip flexion 4+ 4+  Hip extension 4+ 4+  Hip abduction 4+ 4+  Hip adduction  Hip internal rotation    Hip external rotation    Knee flexion 5 5  Knee extension 5 5  Ankle dorsiflexion 5 5  Ankle plantarflexion 5 5  Ankle inversion    Ankle eversion     (Blank rows = not tested)  LUMBAR SPECIAL TESTS:  Straight leg raise test: positive on B for hamstring tightness  FUNCTIONAL TESTS:  5 times sit to stand: 8.46 sec 2 minute walk test: 523 ft  GAIT: Distance walked: 523 Assistive device utilized: None Level of assistance: Complete Independence Comments: done during , no gait deviations seen  TODAY'S TREATMENT:                                                                                                                              DATE:  07/28/23: Quadruped:  Cat/camel 5x 10" Threading the needle x 10 on each Prone:  POE x  Press up 5x 10" Supine:  Bridge 10x  5"  Hamstring stretch with rope 2x 30" Standing:  Wall arch, Y resistance with GTB 10x 2 5" holds  Golf swing (with SPC) 15x Squats 2x 10 UBE, level 2, forward/backward, 2.5' each (5' total)   07/24/23 UBE, level 2, forward/backward, 2.5' each (5' total) Standing I, T exercises, GTB x 3" x 10 x 2 Standing Y exercises, GTB x 10 x 2 Pallof press, GTB x 10 x 2 Doorway pec stretch 60 deg abd x 30" x 3 Seated thoracic ext with a half foam roll x 10 x 2 Seated abdominal press with physioball x 3" x 10 x 2 Seated marches, neutral spine x 10 x 2 x 2 lbs Seated alt knee ext, neutral spine x 10 x 2 x 2 lbs Seated rows, neutral spine, GTB x 10 x 2 Sidelying open book with cervical tracking x 10 on each side Foam roller series with 1 pillow on back (hugs, snow angels x swimmers) x 10 each Cat and cow x 10 x 2 Threading the needle x 10 on each  07/21/23 UBE, level 1, forward/backward, 2.5' each (5' total) Standing I, T, Y exercises, RTB x 10 x 2 Lower trapezius setting on the wall, RTB x 10 x 2 Doorway pec strectch 60 deg abd x 30" x 3 Seated abdominal press with physioball x 3" x 10 x 2 Seated marches, neutral spine x 10 x 2 Seated alt knee ext, neutral spine x 10 x 2 Seated rows, neutral spine, RTB x 10 x 2 Sidelying open book with cervical tracking x 10 on each side Foam roller series with 1 pillow on back (hugs, snow angels x swimmers) x 10 each Cat and cow x 10 x 2 Baby eagle stretch x 30" x 2  07/17/23 UBE, level 1, forward, 5' Doorway pec strectch 60 deg abd x 30" x 3 Seated abdominal press with physioball x 3" x 10 x 2 Sidelying open book with cervical tracking x 10 on each side  Foam roller series with 1 pillow on back (hugs, snow angels x swimmers) x 20 each Baby eagle stretch x 30" x 2   07/15/23: Reviewed goals Educated importance of HEP compliance for maximal benefits   Seated: 3D thoracic excursion 5x each with UE overhead Discussed seated posture Lumbar  support Wback 10x X to V 10x Cervical retraction 10x. Upper trap stretch 2x 30"  Supine:: Hamstring stretch 2x 30" (1 with hands behind knees, 1 with rope)  Standing: 3D hip excursion10x (lateral weight shift, rotation, squat front of chair and lumbar extension)  07/10/23 Evaluation and patient education done    PATIENT EDUCATION:  Education details: Educated on the pathoanatomy of thoracic pain. Educated on the goals and course of rehab.  Person educated: Patient Education method: Explanation Education comprehension: verbalized understanding  HOME EXERCISE PROGRAM: Access Code: [email or web address] URL: [email or web address] 07/21/23 - Shoulder Extension with Resistance  - 1 x daily - 7 x weekly - 2 sets - 10 reps - Standing Shoulder Horizontal Abduction with Anchored Resistance  - 1 x daily - 7 x weekly - 2 sets - 10 reps - Standing Low Trap Setting with Resistance at Wall  - 1 x daily - 7 x weekly - 2 sets - 10 reps - Cat Cow  - 1 x daily - 7 x weekly - 10 reps - Seated March  - 1 x daily - 7 x weekly - 2 sets - 10 reps - Seated Shoulder Row with Anchored Resistance  - 1 x daily - 7 x weekly - 2 sets - 10 reps  07/17/23 - Doorway Pec Stretch at 60 Degrees Abduction with Arm Straight  - 1 x daily - 7 x weekly - 3 reps - 30 hold - Sidelying Thoracic Rotation with Open Book  - 1 x daily - 7 x weekly - 10 reps - [employer] on Foam Roll  - 1 x daily - 7 x weekly - 20 reps - Thoracic Foam Roll Mobilization Backstroke  - 1 x daily - 7 x weekly - 20 reps - Thoracic Foam Roll Mobilization Hug  - 1 x daily - 7 x weekly - 20 reps - Seated Abdominal Press into [employer]  - 1-2 x daily - 7 x weekly - 2 sets - 10 reps - 3 hold - Baby Eagle   - 1 x daily - 7 x weekly - 2 reps - 30 hold  07/15/23:  3D thoracic excursion Access Code: [email or web address] URL: [email or web address] Date: 07/15/2023 Prepared by: Becky Sax  Exercises - Seated Scapular Retraction with  External Rotation  - 1 x daily - 7 x weekly - 3 sets - 10 reps - Seated Passive Cervical Retraction  - 1 x daily - 7 x weekly - 3 sets - 10 reps - Seated Upper Trapezius Stretch  - 2 x daily - 7 x weekly - 3 sets - 3 reps - 30" hold - Supine Hamstring Stretch  - 1-2 x daily - 7 x weekly - 1 sets - 3 reps - 30" hold   ASSESSMENT:  CLINICAL IMPRESSION: Session focus with thoracic and LE mobility and core/proximal strengthening.  Add prone exercises for thoracic mobility and golf swings with good tolerance.  No reports of pain through session and minimal cueing required with exercises this session.     Eval:  Patient is a 67 y.o. male who was seen today for physical therapy evaluation and treatment for spondylosis of the thoracic region. Patient's condition is further defined by  difficulty with prolonged sitting, bending, and lifting due to pain, weakness, and decreased soft tissue extensibility. Skilled PT is required to address the impairments and functional limitations listed below.    OBJECTIVE IMPAIRMENTS: decreased ROM, decreased strength, hypomobility, impaired flexibility, postural dysfunction, and pain.   ACTIVITY LIMITATIONS: carrying, lifting, bending, and sitting  PARTICIPATION LIMITATIONS: meal prep, cleaning, laundry, driving, shopping, community activity, and yard work  PERSONAL FACTORS: Time since onset of injury/illness/exacerbation are also affecting patient's functional outcome.   REHAB POTENTIAL: Good  CLINICAL DECISION MAKING: Stable/uncomplicated  EVALUATION COMPLEXITY: Low   GOALS: Goals reviewed with patient? Yes  SHORT TERM GOALS: Target date: 07/24/23  Pt will demonstrate indep in HEP to facilitate carry-over of skilled services and improve functional outcomes Goal status: IN PROGRESS  LONG TERM GOALS: Target date: 08/07/23  Pt will increase FOTO to at least 61 in order to demonstrate significant improvement in function related to  ADLs Baseline:  50.58 Goal status: IN PROGRESS  2.  Pt will demonstrate increase in thoracic extension and rotation ROM by 25%  to facilitate ease in ADLs  Baseline: see above Goal status: IN PROGRESS  3.  Patient will demonstrate increase in UE strength to 5/5 to facilitate ease in ADLs  Baseline: 4/5 Goal status: IN PROGRESS  4.  Pt will demonstrate increase in LE strength to 5/5 to facilitate ease and safety in ADLs  Baseline: 4+/5 Goal status: IN PROGRESS  PLAN:  PT FREQUENCY: 2x/week  PT DURATION: 4 weeks  PLANNED INTERVENTIONS: 97164- PT Re-evaluation, 97110-Therapeutic exercises, 97530- Therapeutic activity, 97112- Neuromuscular re-education, 97535- Self Care, 16109- Manual therapy, 97014- Electrical stimulation (unattended), Patient/Family education, Taping, Cryotherapy, and Moist heat.  PLAN FOR NEXT SESSION: Continue POC and may progress as tolerated with emphasis on thoracic mobility and strengthening activities as well as LE and core strengthening.   Becky Sax, LPTA/CLT; CBIS [phone]  Juel Burrow, PTA 07/28/2023, 12:55 PM  07/28/2023, 12:55 PM

## 2023-08-02 DIAGNOSIS — E119 Type 2 diabetes mellitus without complications: Secondary | ICD-10-CM | POA: Diagnosis not present

## 2023-08-03 ENCOUNTER — Ambulatory Visit (HOSPITAL_COMMUNITY): Payer: Medicare Other | Attending: Orthopedic Surgery

## 2023-08-03 ENCOUNTER — Encounter (HOSPITAL_COMMUNITY): Payer: Self-pay

## 2023-08-03 DIAGNOSIS — M546 Pain in thoracic spine: Secondary | ICD-10-CM | POA: Insufficient documentation

## 2023-08-03 NOTE — Therapy (Addendum)
OUTPATIENT PHYSICAL THERAPY THORACOLUMBAR TREATMENT   Patient Name: Isaac Guerrero MRN: [id number] DOB:1956-05-20, 67 y.o., male Today's Date: 08/03/2023  END OF SESSION:  PT End of Session - 08/03/23 1052     Visit Number 7    Number of Visits 8    Date for PT Re-Evaluation 08/07/23    Authorization Type BCBS Medicare (no auth, no limit)    PT Start Time 1052    PT Stop Time 1123    PT Time Calculation (min) 31 min    Activity Tolerance Patient tolerated treatment well    Behavior During Therapy WFL for tasks assessed/performed            Past Medical History:  Diagnosis Date   Anemia    Arthritis    oa   BPH (benign prostatic hyperplasia) 08/1989   GERD (gastroesophageal reflux disease) 11/29/1998   Hyperlipidemia 08/1997   Hypertension    Hypothyroidism 1983   Lesion of bladder    Lesion of bladder 02/28/2021   Pre-diabetes    Pulmonary nodules    Spermatocele    per Dr. Annabell Howells with URO   Urinary frequency 02/28/2021   and buring with urination   Past Surgical History:  Procedure Laterality Date   CATARACT EXTRACTION     left eye 03-2003 and right fall 2021   COLONOSCOPY  08/10/2009   polyp in ascending colon otherwise normal   COLONOSCOPY WITH ESOPHAGOGASTRODUODENOSCOPY (EGD) N/A 10/28/2013   Dr. Rourk:Schatzki's ring - not manipulated because no dysphagia currently. Small hiatal hernia.  Gastric and duodenal erosions of uncertain significance-status post biopsy. path with benign findings, no villous atrophy, no H.pylori/TCS:Colonic diverticulosis   CYSTOSCOPY  08/1989   normal   CYSTOSCOPY WITH BIOPSY N/A 02/07/2020   Procedure: CYSTOSCOPY WITH BLADDER AND PROSTATIS URETHRAL  BIOPSY WIHT FULGURATION AND INSTILL GEMCITABINE;  Surgeon: Bjorn Pippin, MD;  Location: [facility];  Service: Urology;  Laterality: N/A;   CYSTOSCOPY WITH BIOPSY Bilateral 08/09/2020   Procedure: CYSTOSCOPY WITH BLADDER BIOPSYAND FULGURATION BILATERAL RETROGRADE S;   Surgeon: Bjorn Pippin, MD;  Location: [facility];  Service: Urology;  Laterality: Bilateral;   CYSTOSCOPY WITH BIOPSY N/A 03/05/2021   Procedure: CYSTOSCOPY WITH BIOPSY FULGURATION;  Surgeon: Bjorn Pippin, MD;  Location: [facility];  Service: Urology;  Laterality: N/A;   ESOPHAGOGASTRODUODENOSCOPY  10/1999   distal esoph. stricture; sliding H. H.   LASIK  04/12/2003   right eye   lumbar microdisectomy  2003   Dr. Gerlene Fee   Patient Active Problem List   Diagnosis Date Noted   Rhinitis, chronic 05/26/2022   Multiple pulmonary nodules determined by computed tomography of lung 04/10/2020   DOE (dyspnea on exertion) 04/10/2020   Seasonal and perennial allergic rhinitis 06/23/2017   Non-seasonal allergic rhinitis due to fungal spores 12/23/2016   Anemia 10/10/2013   Knee pain 05/14/2011   PALPITATIONS 07/21/2010   LOW BACK PAIN, CHRONIC 03/27/2009   Hypothyroidism 09/23/2007   HYPERLIPIDEMIA 09/23/2007   GERD 09/23/2007   BENIGN PROSTATIC HYPERTROPHY 09/23/2007   PSORIASIS 09/22/2007   HYPERGLYCEMIA 09/22/2007    PCP: Benita Stabile, MD  REFERRING PROVIDER: Adelfa Koh, MD  REFERRING DIAG: [phone] (ICD-10-CM) - Spondylosis without myelopathy or radiculopathy, thoracic region  Rationale for Evaluation and Treatment: Rehabilitation  THERAPY DIAG:  Pain in thoracic spine  ONSET DATE: 2019  SUBJECTIVE:  SUBJECTIVE STATEMENT: Reports he is feeling good today.  No reports of pain for a couple of weeks now.    Eval:  Arrives to the clinic with c/o intermittent mid back pain (see below). Denies pain at the time of evaluation. Condition started in 2019 which gradually got worse without apparent reason. A month ago, pain got really worse so patient decided to go to his MD. X-ray was  done and revealed bone spurs and arthritis. Patient was given Prednisone and was instructed to take the medications when he starts PT. Patient was then referred to outpatient PT evaluation and management. Patient states that the back pain is getting better now since MD appointment.  PERTINENT HISTORY:  Hx of lumbar discectomy (2000)  PAIN:  Are you having pain? Yes: NPRS scale: 0/10 Pain location: mid back, radiates to the R side of the spine and just above the buttocks Pain description: tingling, intermittent Aggravating factors: Sitting for 3-4 hours, bending, driving, lifting Relieving factors: laying down flat, Tramadol  PRECAUTIONS: None  RED FLAGS: Bowel or bladder incontinence: No, Cauda equina syndrome: No, and Compression fracture: No   WEIGHT BEARING RESTRICTIONS: No  FALLS:  Has patient fallen in last 6 months? No  LIVING ENVIRONMENT: Lives with: lives with their spouse Lives in: House/apartment Stairs: Yes: External: 2 steps; none Has following equipment at home: None  OCCUPATION: retired  PLOF: Independent and Independent with basic ADLs  PATIENT GOALS: "just to get better"  NEXT MD VISIT: PRN  OBJECTIVE:  Note: Objective measures were completed at Evaluation unless otherwise noted.  DIAGNOSTIC FINDINGS:  None done recently  PATIENT SURVEYS:  FOTO 50.58  SCREENING FOR RED FLAGS: Bowel or bladder incontinence: No Spinal tumors: No Cauda equina syndrome: No Compression fracture: No  COGNITION: Overall cognitive status: Within functional limits for tasks assessed     SENSATION: Light touch: WFL on B LE  MUSCLE LENGTH: Hamstrings: moderate restriction on B Thomas test: mild restriction on B hip flexors Piriformis: mild restriction on B Pectoralis: moderate restriction on B Upper trapezius: moderate restriction on B  POSTURE: rounded shoulders, forward head, decreased lumbar lordosis, and increased thoracic kyphosis  PALPATION: No tenderness  on paraspinals and major bony landmarks of the thoracic spine  THORACIC ROM:   AROM eval 08/03/23:  Flexion 75% 100%  Extension 25% 10%  Right lateral flexion    Left lateral flexion    Right rotation 50% WNL  Left rotation 50% 10%   (Blank rows = not tested)  LOWER EXTREMITY ROM:     Active  Right eval Left eval  Hip flexion [facility]  Hip extension [facility]  Hip abduction [facility]  Hip adduction    Hip internal rotation    Hip external rotation    Knee flexion [facility]  Knee extension [facility]  Ankle dorsiflexion [facility]  Ankle plantarflexion [facility] WFL  Ankle inversion    Ankle eversion     (Blank rows = not tested)  UPPER EXTREMITY MMT:  MMT Right eval Left eval Right 08/03/23 Left 1202/24  Shoulder flexion 5 5    Shoulder abduction 5 5    Shoulder internal rotation 5 5    Shoulder external rotation 5 5    Middle trapezius 4 4 4+ 5  Elbow flexion 5 5    Elbow extension 5 5    Grip 5 5     (Blank rows = not tested)  LOWER EXTREMITY MMT:    MMT Right eval  Left eval Right 08/03/23 Left 1202/24  Hip flexion 4+ 4+    Hip extension 4+ 4+ 5 5  Hip abduction 4+ 4+ 5 5  Hip adduction      Hip internal rotation      Hip external rotation      Knee flexion 5 5    Knee extension 5 5    Ankle dorsiflexion 5 5    Ankle plantarflexion 5 5    Ankle inversion      Ankle eversion       (Blank rows = not tested)  LUMBAR SPECIAL TESTS:  Straight leg raise test: positive on B for hamstring tightness 08/03/23:  Hamstring Rt 120 degrees; Lt 130 degrees  FUNCTIONAL TESTS:  5 times sit to stand: 8.46 sec 2 minute walk test: 523 ft   1202/24: 5 times STS 6.98"    : 662 ft    FOTO: 78.5% GAIT: Distance walked: 523 Assistive device utilized: None Level of assistance: Complete Independence Comments: done during , no gait deviations seen  TODAY'S TREATMENT:                                                                                                                               DATE:  08/03/23: UBE, level 2, forward/backward, 2.5' each (5' total) FOTO 78.5% MMT thoracic and LE Hamstring mobility ROM 5STS 6.98" 678ft  Wback with BTB 20x 5" Standing:  Wall arch, Y resistance with BTB 10x 2 5" holds    07/28/23: Quadruped:  Cat/camel 5x 10" Threading the needle x 10 on each Prone:  POE x  Press up 5x 10" Supine:  Bridge 10x 5"  Hamstring stretch with rope 2x 30" Standing:  Wall arch, Y resistance with GTB 10x 2 5" holds  Golf swing (with SPC) 15x Squats 2x 10 UBE, level 2, forward/backward, 2.5' each (5' total)   07/24/23 UBE, level 2, forward/backward, 2.5' each (5' total) Standing I, T exercises, GTB x 3" x 10 x 2 Standing Y exercises, GTB x 10 x 2 Pallof press, GTB x 10 x 2 Doorway pec stretch 60 deg abd x 30" x 3 Seated thoracic ext with a half foam roll x 10 x 2 Seated abdominal press with physioball x 3" x 10 x 2 Seated marches, neutral spine x 10 x 2 x 2 lbs Seated alt knee ext, neutral spine x 10 x 2 x 2 lbs Seated rows, neutral spine, GTB x 10 x 2 Sidelying open book with cervical tracking x 10 on each side Foam roller series with 1 pillow on back (hugs, snow angels x swimmers) x 10 each Cat and cow x 10 x 2 Threading the needle x 10 on each  07/21/23 UBE, level 1, forward/backward, 2.5' each (5' total) Standing I, T, Y exercises, RTB x 10 x 2 Lower trapezius setting on the wall, RTB x 10 x 2 Doorway pec strectch 60 deg abd x 30"  x 3 Seated abdominal press with physioball x 3" x 10 x 2 Seated marches, neutral spine x 10 x 2 Seated alt knee ext, neutral spine x 10 x 2 Seated rows, neutral spine, RTB x 10 x 2 Sidelying open book with cervical tracking x 10 on each side Foam roller series with 1 pillow on back (hugs, snow angels x swimmers) x 10 each Cat and cow x 10 x 2 Baby eagle stretch x 30" x 2  07/17/23 UBE, level 1, forward, 5' Doorway pec strectch 60 deg abd x 30" x 3 Seated abdominal  press with physioball x 3" x 10 x 2 Sidelying open book with cervical tracking x 10 on each side Foam roller series with 1 pillow on back (hugs, snow angels x swimmers) x 20 each Baby eagle stretch x 30" x 2   07/15/23: Reviewed goals Educated importance of HEP compliance for maximal benefits   Seated: 3D thoracic excursion 5x each with UE overhead Discussed seated posture Lumbar support Wback 10x X to V 10x Cervical retraction 10x. Upper trap stretch 2x 30"  Supine:: Hamstring stretch 2x 30" (1 with hands behind knees, 1 with rope)  Standing: 3D hip excursion10x (lateral weight shift, rotation, squat front of chair and lumbar extension)  07/10/23 Evaluation and patient education done    PATIENT EDUCATION:  Education details: Educated on the pathoanatomy of thoracic pain. Educated on the goals and course of rehab.  Person educated: Patient Education method: Explanation Education comprehension: verbalized understanding  HOME EXERCISE PROGRAM: Access Code: [email or web address] URL: [email or web address] 07/21/23 - Shoulder Extension with Resistance  - 1 x daily - 7 x weekly - 2 sets - 10 reps - Standing Shoulder Horizontal Abduction with Anchored Resistance  - 1 x daily - 7 x weekly - 2 sets - 10 reps - Standing Low Trap Setting with Resistance at Wall  - 1 x daily - 7 x weekly - 2 sets - 10 reps - Cat Cow  - 1 x daily - 7 x weekly - 10 reps - Seated March  - 1 x daily - 7 x weekly - 2 sets - 10 reps - Seated Shoulder Row with Anchored Resistance  - 1 x daily - 7 x weekly - 2 sets - 10 reps  07/17/23 - Doorway Pec Stretch at 60 Degrees Abduction with Arm Straight  - 1 x daily - 7 x weekly - 3 reps - 30 hold - Sidelying Thoracic Rotation with Open Book  - 1 x daily - 7 x weekly - 10 reps - [employer] on Foam Roll  - 1 x daily - 7 x weekly - 20 reps - Thoracic Foam Roll Mobilization Backstroke  - 1 x daily - 7 x weekly - 20 reps - Thoracic Foam Roll Mobilization Hug   - 1 x daily - 7 x weekly - 20 reps - Seated Abdominal Press into [employer]  - 1-2 x daily - 7 x weekly - 2 sets - 10 reps - 3 hold - Baby Eagle   - 1 x daily - 7 x weekly - 2 reps - 30 hold  07/15/23:  3D thoracic excursion Access Code: [email or web address] URL: [email or web address] Date: 07/15/2023 Prepared by: Becky Sax  Exercises - Seated Scapular Retraction with External Rotation  - 1 x daily - 7 x weekly - 3 sets - 10 reps - Seated Passive Cervical Retraction  - 1 x daily - 7 x weekly - 3 sets - 10 reps - Seated Upper  Trapezius Stretch  - 2 x daily - 7 x weekly - 3 sets - 3 reps - 30" hold - Supine Hamstring Stretch  - 1-2 x daily - 7 x weekly - 1 sets - 3 reps - 30" hold   ASSESSMENT:  CLINICAL IMPRESSION: Pt reports he has been pain free for a couple weeks and reports no functional restrictions.  Pt present with compliance with HEP daily and improved self perceived functional abilities.  Objective testing present with improved ROM, strength, increased time with STS and cadence with .  No restrictions found, reviewed importance of HEP compliance following PT with verbalized understanding.    Eval:  Patient is a 67 y.o. male who was seen today for physical therapy evaluation and treatment for spondylosis of the thoracic region. Patient's condition is further defined by difficulty with prolonged sitting, bending, and lifting due to pain, weakness, and decreased soft tissue extensibility. Skilled PT is required to address the impairments and functional limitations listed below.    OBJECTIVE IMPAIRMENTS: decreased ROM, decreased strength, hypomobility, impaired flexibility, postural dysfunction, and pain.   ACTIVITY LIMITATIONS: carrying, lifting, bending, and sitting  PARTICIPATION LIMITATIONS: meal prep, cleaning, laundry, driving, shopping, community activity, and yard work  PERSONAL FACTORS: Time since onset of injury/illness/exacerbation are also affecting  patient's functional outcome.   REHAB POTENTIAL: Good  CLINICAL DECISION MAKING: Stable/uncomplicated  EVALUATION COMPLEXITY: Low   GOALS: Goals reviewed with patient? Yes  SHORT TERM GOALS: Target date: 07/24/23  Pt will demonstrate indep in HEP to facilitate carry-over of skilled services and improve functional outcomes;   08/03/23:  Reports compliance with advanced HEP daily. Goal status: MET  LONG TERM GOALS: Target date: 08/07/23  Pt will increase FOTO to at least 61 in order to demonstrate significant improvement in function related to  ADLs Baseline: 50.58; 08/03/23: 78.5%  Goal status: MET  2.  Pt will demonstrate increase in thoracic extension and rotation ROM by 25%  to facilitate ease in ADLs  Baseline: see above Goal status: MET  3.  Patient will demonstrate increase in UE strength to 5/5 to facilitate ease in ADLs  Baseline: 4/5 Goal status: MET  4.  Pt will demonstrate increase in LE strength to 5/5 to facilitate ease and safety in ADLs  Baseline: 4+/5 Goal status: MET  PLAN:  PT FREQUENCY:  0  PT DURATION: other: 0  PLANNED INTERVENTIONS:  D/C  from skilled PT. D/C to independent HEP .  Becky Sax, LPTA/CLT; Rowe Clack [phone] 08/03/2023, 11:31 AM  Tish Frederickson. Adivino, PT, DPT, OCS Board-Certified Clinical Specialist in Orthopedic PT PT Compact Privilege # ([place]): [phone] T 08/11/2023 3:15 PM

## 2023-08-05 ENCOUNTER — Encounter (HOSPITAL_COMMUNITY): Payer: Medicare Other

## 2023-08-05 DIAGNOSIS — K080 Exfoliation of teeth due to systemic causes: Secondary | ICD-10-CM | POA: Diagnosis not present

## 2023-08-10 ENCOUNTER — Encounter (HOSPITAL_COMMUNITY): Payer: Medicare Other

## 2023-08-11 ENCOUNTER — Encounter (HOSPITAL_COMMUNITY): Payer: Self-pay

## 2023-08-11 NOTE — Therapy (Signed)
PHYSICAL THERAPY DISCHARGE SUMMARY  Visits from Start of Care: 7  Current functional level related to goals / functional outcomes: See previous note dated 08/03/23   Remaining deficits: See previous note dated 08/03/23   Education / Equipment: See previous note dated 08/03/23   Patient agrees to discharge. Patient goals were met. Patient is being discharged due to meeting the stated rehab goals.   Tish Frederickson. Oneisha Ammons, PT, DPT, OCS Board-Certified Clinical Specialist in Orthopedic PT PT Compact Privilege # ([place]): [phone] T

## 2023-08-24 DIAGNOSIS — E119 Type 2 diabetes mellitus without complications: Secondary | ICD-10-CM | POA: Diagnosis not present

## 2023-08-31 NOTE — Progress Notes (Signed)
 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 No new co's.    Dyspnea:  Ex x treadmill x 1.5 mph on grade ? % x 15 min daily s sob  Cough: none Sleeping: no resp flat / on side  SABA use: no 02: no Covid status: vax x 3  Lung cancer screening: na  Rec No change in recommendations but try to build up to 30 min of exercise  lab esr 25/ ACE  66 x-ray cxr s change        05/26/2022  yearly  f/u ov/Isaac Guerrero office/Isaac Guerrero re: MPNs / cough /nasal congestion q noct x years  Chief Complaint  Patient presents with   Follow-up    Patient doing well since last ov no new concerns   Dyspnea:  no doe doing step master x 30-40 min  Cough: dry / sporadic no obvious triggers other than smells  Sleeping: nasal strips helps, sometimes benadryl  SABA use: none  02: none  Rec referral to ENT in Isaac Guerrero  >>> 08/05/22 [place] [facility] eval:  Findings: Nasal septal deviation and spurring. No  purulent mucus. No inflammatory or granulomatous changes.     08/26/2022  f/u ov/Isaac Guerrero office/Isaac Guerrero re: doe/ mpns ? Sarcoid?  maint on nothing    Chief Complaint  Patient presents with   Follow-up    Breathing about the same since last ov. No changes  Did see ENT   Dyspnea:  stairmaster 3 x weekly x 30 min Cough: none  Sleeping: ok  not needing benadryl  SABA use: none  02: none Covid status: max vax  Rec If your nasal symptoms worsen I recommend you see Isaac Guerrero again to regroup re best options Make sure you check your oxygen saturation at your highest level of activity(NOT after you stop)  to be sure it stays over 90%  Please schedule a follow up visit in 12 months but call sooner if needed  with all medications /inhalers/ solutions in hand      09/01/2023 yearly  f/u ov/[place] office/Isaac Guerrero re: mpns/doe ? Sarcoid  maint on no meds  Chief Complaint  Patient presents with   Follow-up  Dyspnea:  not limited, getting PT for back problems  Cough: nose drips and makes him cough  as  does perfume /flowers  Sleeping: flat bed/ one pillow occ needs cough drop at hs   SABA use: none  02: none      No obvious day to day or daytime variability or assoc excess/ purulent sputum or mucus plugs or hemoptysis or cp or chest tightness, subjective wheeze or overt sinus or hb symptoms.    Also denies any obvious fluctuation of symptoms with weather or environmental changes or other aggravating or alleviating factors except as outlined above   No unusual exposure hx or h/o childhood pna/ asthma or knowledge of premature birth.  Current Allergies, Complete Past Medical History, Past Surgical History, Family History, and Social History were reviewed in [employer] record.  ROS  The following are not active complaints unless bolded Hoarseness, sore throat, dysphagia, dental problems, itching, sneezing,  nasal congestion or discharge of excess mucus or purulent  secretions, ear ache,   fever, chills, sweats, unintended wt loss or wt gain, classically pleuritic or exertional cp,  orthopnea pnd or arm/hand swelling  or leg swelling, presyncope, palpitations, abdominal pain, anorexia, nausea, vomiting, diarrhea  or change in bowel habits or change in bladder habits, change in stools or change in urine, dysuria, hematuria,  rash, arthralgias, visual complaints, headache, numbness, weakness or ataxia or problems with walking or coordination,  change in mood or  memory.        Current Meds  Medication Sig   b complex vitamins tablet Take 1 tablet by mouth daily.   Cholecalciferol (VITAMIN D3) 2000 UNITS TABS Take 2,000 Units by mouth daily.   Coenzyme Q10 200 MG capsule Take 200 mg by mouth daily.   gabapentin (NEURONTIN) 100 MG capsule Take 100 mg by mouth at bedtime as needed.   levothyroxine (SYNTHROID) 88 MCG tablet Take 88 mcg by mouth daily before breakfast.   loratadine (CLARITIN) 10 MG tablet Take 10 mg by mouth daily.   losartan (COZAAR) 50 MG tablet Take 50 mg by mouth daily.   meloxicam (MOBIC) 15 MG tablet Take 15 mg by mouth daily as needed for pain.    Omega-3 1400 MG CAPS    pantoprazole  (PROTONIX ) 40 MG tablet Take 40 mg by mouth daily.   traMADol (ULTRAM) 50 MG tablet Take 50 mg by mouth every 6 (six) hours as needed for moderate pain.    Turmeric Curcumin 500 MG CAPS    ZOCOR  40 MG tablet TAKE ONE TABLET BY MOUTH IN THE EVENING.              Past Medical History:  Diagnosis Date   Arthritis    BPH (benign prostatic hyperplasia) 08/1989   GERD (gastroesophageal reflux disease) 11/29/1998   Hyperlipidemia 08/1997   Hypertension    Hypothyroidism 1983   Lesion of bladder    Spermatocele    per Isaac Guerrero with URO       Objective:     Wts  09/01/2023      187  10/27/2021       189  05/26/2022       185  05/23/2021       182   10/25/20 189 lb 6.4 oz (85.9 kg)  09/06/20 188 lb (85.3 kg)  08/09/20 187 lb (84.8 kg)     Vital signs reviewed  09/01/2023  - Note at rest 02 sats  92% on RA   General appearance:    amb somber wm nad    HEENT : Oropharynx  clear  Nasal turbinates nl    NECK :  without  apparent JVD/ palpable Nodes/TM    LUNGS: no acc muscle use,  Nl contour chest which is clear to A and P bilaterally without cough on insp or exp maneuvers   CV:  RRR  no s3 or murmur or increase in P2, and no edema   ABD:  soft and nontender   MS:  Gait nl   ext warm without deformities Or obvious joint restrictions  calf tenderness, cyanosis or clubbing    SKIN: warm and dry without lesions    NEURO:  alert, approp, nl sensorium with  no motor or cerebellar deficits apparent.     CXR PA and Lateral:   09/01/2023 :    I personally reviewed images and impression is as follows:     No change RML scarring/ very mild and stable  RN pattern c/w sarcoid hx    Assessment

## 2023-09-01 ENCOUNTER — Ambulatory Visit: Payer: Medicare Other | Admitting: Internal Medicine

## 2023-09-01 ENCOUNTER — Ambulatory Visit (HOSPITAL_COMMUNITY)
Admission: RE | Admit: 2023-09-01 | Discharge: 2023-09-01 | Disposition: A | Discharge status: Home / Self Care | Payer: Medicare Other | Source: Ambulatory Visit | Attending: Internal Medicine | Admitting: Internal Medicine

## 2023-09-01 ENCOUNTER — Encounter: Payer: Self-pay | Admitting: Internal Medicine

## 2023-09-01 VITALS — BP 141/80 | HR 71 | Ht 70.0 in | Wt 187.4 lb

## 2023-09-01 DIAGNOSIS — R918 Other nonspecific abnormal finding of lung field: Secondary | ICD-10-CM | POA: Insufficient documentation

## 2023-09-01 DIAGNOSIS — R0602 Shortness of breath: Secondary | ICD-10-CM | POA: Diagnosis not present

## 2023-09-01 DIAGNOSIS — D869 Sarcoidosis, unspecified: Secondary | ICD-10-CM | POA: Diagnosis not present

## 2023-09-01 NOTE — Patient Instructions (Signed)
 For drainage / throat tickle stop the loratidine  try take CHLORPHENIRAMINE  4 mg  (Allergy Relief 4mg   at [facility] should be easiest to find in the blue/green  box usually on bottom shelf)  take one every 4 hours as needed - extremely effective and inexpensive over the counter- may cause drowsiness so start with just a dose or two an hour before bedtime and see how you tolerate it before trying in daytime.   Remind your eye doctor you have sarcoid   Please remember to go to the  x-ray department  @  [facility] for your tests - we will call you with the results when they are available      Please schedule a follow up visit in 12  months but call sooner if needed

## 2023-09-02 DIAGNOSIS — E119 Type 2 diabetes mellitus without complications: Secondary | ICD-10-CM | POA: Diagnosis not present

## 2023-09-02 NOTE — Assessment & Plan Note (Signed)
 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 slt progression  Clinically and radiographically there is not enough active dz to warrant any change in concservative rx here  NB A good rule of thumb is that >95% of pts with active sarcoid in any organ will have some plain cxr evoling changes - on the other hand  if there are active pulmonary symptoms the cxr will look much worse than the patient:  No evidence of either scenario here/ strongly doubt active dz    F/u can be yearly, call sooner prn          Each maintenance medication was reviewed in detail including emphasizing most importantly the difference between maintenance and prns and under what circumstances the prns are to be triggered using an action plan format where appropriate.  Total time for H and P, chart review, counseling,   and generating customized AVS unique to this office visit / same day charting = 27 min

## 2023-09-22 DIAGNOSIS — E782 Mixed hyperlipidemia: Secondary | ICD-10-CM | POA: Diagnosis not present

## 2023-09-22 DIAGNOSIS — D509 Iron deficiency anemia, unspecified: Secondary | ICD-10-CM | POA: Diagnosis not present

## 2023-09-22 DIAGNOSIS — E1122 Type 2 diabetes mellitus with diabetic chronic kidney disease: Secondary | ICD-10-CM | POA: Diagnosis not present

## 2023-09-22 DIAGNOSIS — E039 Hypothyroidism, unspecified: Secondary | ICD-10-CM | POA: Diagnosis not present

## 2023-09-25 DIAGNOSIS — K080 Exfoliation of teeth due to systemic causes: Secondary | ICD-10-CM | POA: Diagnosis not present

## 2023-09-28 DIAGNOSIS — D509 Iron deficiency anemia, unspecified: Secondary | ICD-10-CM | POA: Diagnosis not present

## 2023-09-28 DIAGNOSIS — E1159 Type 2 diabetes mellitus with other circulatory complications: Secondary | ICD-10-CM | POA: Diagnosis not present

## 2023-09-28 DIAGNOSIS — I10 Essential (primary) hypertension: Secondary | ICD-10-CM | POA: Diagnosis not present

## 2023-09-28 DIAGNOSIS — N1831 Chronic kidney disease, stage 3a: Secondary | ICD-10-CM | POA: Diagnosis not present

## 2023-09-28 DIAGNOSIS — E782 Mixed hyperlipidemia: Secondary | ICD-10-CM | POA: Diagnosis not present

## 2023-09-28 DIAGNOSIS — I129 Hypertensive chronic kidney disease with stage 1 through stage 4 chronic kidney disease, or unspecified chronic kidney disease: Secondary | ICD-10-CM | POA: Diagnosis not present

## 2023-10-01 ENCOUNTER — Encounter (INDEPENDENT_AMBULATORY_CARE_PROVIDER_SITE_OTHER): Payer: Self-pay | Admitting: *Deleted

## 2023-10-03 DIAGNOSIS — E119 Type 2 diabetes mellitus without complications: Secondary | ICD-10-CM | POA: Diagnosis not present

## 2023-10-31 DIAGNOSIS — E119 Type 2 diabetes mellitus without complications: Secondary | ICD-10-CM | POA: Diagnosis not present

## 2023-12-01 ENCOUNTER — Other Ambulatory Visit: Payer: Self-pay

## 2023-12-01 ENCOUNTER — Telehealth: Payer: Self-pay

## 2023-12-01 DIAGNOSIS — Z1211 Encounter for screening for malignant neoplasm of colon: Secondary | ICD-10-CM

## 2023-12-01 DIAGNOSIS — E119 Type 2 diabetes mellitus without complications: Secondary | ICD-10-CM | POA: Diagnosis not present

## 2023-12-01 MED ORDER — NA SULFATE-K SULFATE-MG SULF 17.5-3.13-1.6 GM/177ML PO SOLN: 1.0000 | Freq: Once | ORAL | 0 refills | Status: AC

## 2023-12-01 NOTE — Telephone Encounter (Signed)
 Gastroenterology Pre-Procedure Review  Request Date: 12/23/23 Requesting Physician: Dr. Tobi Bastos  PATIENT REVIEW QUESTIONS: The patient responded to the following health history questions as indicated:    1. Are you having any GI issues? no 2. Do you have a personal history of Polyps? no 3. Do you have a family history of Colon Cancer or Polyps? no 4. Diabetes Mellitus?  Prediabetic no meds 5. Joint replacements in the past 12 months?no 6. Major health problems in the past 3 months?no 7. Any artificial heart valves, MVP, or defibrillator?no    MEDICATIONS & ALLERGIES:    Patient reports the following regarding taking any anticoagulation/antiplatelet therapy:   Plavix, Coumadin, Eliquis, Xarelto, Lovenox, Pradaxa, Brilinta, or Effient? no Aspirin? no  Patient confirms/reports the following medications:  Current Outpatient Medications  Medication Sig Dispense Refill   b complex vitamins tablet Take 1 tablet by mouth daily.     Cholecalciferol (VITAMIN D3) 2000 UNITS TABS Take 2,000 Units by mouth daily.     Coenzyme Q10 200 MG capsule Take 200 mg by mouth daily.     fluocinonide gel (LIDEX) 0.05 %      gabapentin (NEURONTIN) 100 MG capsule Take 100 mg by mouth at bedtime as needed.     levothyroxine (SYNTHROID) 88 MCG tablet Take 88 mcg by mouth daily before breakfast.     loratadine (CLARITIN) 10 MG tablet Take 10 mg by mouth daily.     losartan (COZAAR) 100 MG tablet      meloxicam (MOBIC) 15 MG tablet Take 15 mg by mouth daily as needed for pain.   1   Na Sulfate-K Sulfate-Mg Sulfate concentrate (SUPREP) 17.5-3.13-1.6 GM/177ML SOLN Take 1 kit (354 mLs total) by mouth once for 1 dose. 354 mL 0   Omega-3 1400 MG CAPS      pantoprazole (PROTONIX) 40 MG tablet Take 40 mg by mouth daily.     traMADol (ULTRAM) 50 MG tablet Take 50 mg by mouth every 6 (six) hours as needed for moderate pain.      Turmeric Curcumin 500 MG CAPS      ZOCOR 40 MG tablet TAKE ONE TABLET BY MOUTH IN THE EVENING.  30 each 4   No current facility-administered medications for this visit.    Patient confirms/reports the following allergies:  Allergies  Allergen Reactions   Cephalexin Rash   Penicillins Rash   Sulfonamide Derivatives Rash    No orders of the defined types were placed in this encounter.   AUTHORIZATION INFORMATION Primary Insurance: 1D#: Group #:  Secondary Insurance: 1D#: Group #:  SCHEDULE INFORMATION: Date: 12/23/23 Time: Location: [facility]

## 2023-12-01 NOTE — Telephone Encounter (Signed)
 Referral has been put in

## 2023-12-01 NOTE — Telephone Encounter (Signed)
 The patient called in to schedule his colonoscopy.

## 2023-12-23 ENCOUNTER — Ambulatory Visit: Admitting: Anesthesiology

## 2023-12-23 ENCOUNTER — Encounter: Payer: Self-pay | Admitting: Gastroenterology

## 2023-12-23 ENCOUNTER — Encounter
Admission: RE | Disposition: A | Discharge status: Home / Self Care | Payer: Self-pay | Source: Home / Self Care | Attending: Gastroenterology

## 2023-12-23 ENCOUNTER — Ambulatory Visit
Admission: RE | Admit: 2023-12-23 | Discharge: 2023-12-23 | Disposition: A | Discharge status: Home / Self Care | Attending: Gastroenterology | Admitting: Gastroenterology

## 2023-12-23 ENCOUNTER — Other Ambulatory Visit: Payer: Self-pay

## 2023-12-23 DIAGNOSIS — E039 Hypothyroidism, unspecified: Secondary | ICD-10-CM | POA: Diagnosis not present

## 2023-12-23 DIAGNOSIS — K219 Gastro-esophageal reflux disease without esophagitis: Secondary | ICD-10-CM | POA: Insufficient documentation

## 2023-12-23 DIAGNOSIS — Z1211 Encounter for screening for malignant neoplasm of colon: Secondary | ICD-10-CM | POA: Diagnosis not present

## 2023-12-23 DIAGNOSIS — I10 Essential (primary) hypertension: Secondary | ICD-10-CM | POA: Diagnosis not present

## 2023-12-23 DIAGNOSIS — Z09 Encounter for follow-up examination after completed treatment for conditions other than malignant neoplasm: Secondary | ICD-10-CM | POA: Diagnosis not present

## 2023-12-23 HISTORY — PX: COLONOSCOPY: SHX5424

## 2023-12-23 SURGERY — COLONOSCOPY
Anesthesia: General

## 2023-12-23 MED ORDER — SODIUM CHLORIDE 0.9 % IV SOLN: INTRAVENOUS | Status: DC

## 2023-12-23 MED ORDER — PROPOFOL 500 MG/50ML IV EMUL
INTRAVENOUS | Status: DC | PRN
  Administered 2023-12-23: 150 ug/kg/min via INTRAVENOUS

## 2023-12-23 MED ORDER — LIDOCAINE HCL (CARDIAC) PF 100 MG/5ML IV SOSY
PREFILLED_SYRINGE | INTRAVENOUS | Status: DC | PRN
  Administered 2023-12-23: 50 mg via INTRAVENOUS

## 2023-12-23 MED ORDER — LIDOCAINE HCL (PF) 2 % IJ SOLN
INTRAMUSCULAR | Status: AC
  Filled 2023-12-23: qty 5

## 2023-12-23 MED ORDER — PROPOFOL 1000 MG/100ML IV EMUL
INTRAVENOUS | Status: AC
  Filled 2023-12-23: qty 100

## 2023-12-23 MED ORDER — PROPOFOL 10 MG/ML IV BOLUS
INTRAVENOUS | Status: DC | PRN
  Administered 2023-12-23: 60 mg via INTRAVENOUS
  Administered 2023-12-23: 40 mg via INTRAVENOUS

## 2023-12-23 NOTE — Transfer of Care (Signed)
 Immediate Anesthesia Transfer of Care Note  Patient: Isaac Guerrero  Procedure(s) Performed: COLONOSCOPY  Patient Location: PACU and Endoscopy Unit  Anesthesia Type:General  Level of Consciousness: drowsy and patient cooperative  Airway & Oxygen Therapy: Patient Spontanous Breathing  Post-op Assessment: Report given to RN and Post -op Vital signs reviewed and stable  Post vital signs: Reviewed and stable  Last Vitals:  Vitals Value Taken Time  BP 92/61 12/23/23 1315  Temp    Pulse 77 12/23/23 1315  Resp 16 12/23/23 1315  SpO2 99 % 12/23/23 1315  Vitals shown include unfiled device data.  Last Pain:  Vitals:   12/23/23 1227  TempSrc: Temporal  PainSc: 0-No pain         Complications: No notable events documented.

## 2023-12-23 NOTE — Op Note (Signed)
 [facility] Patient Name: Isaac Guerrero Procedure Date: 12/23/2023 12:50 PM MRN: [id number] Account #: [id number] Date of Birth: [birth year] Admit Type: Outpatient Age: 68 Room: [facility] ENDO ROOM 3 Gender: Male Note Status: Finalized Instrument Name: Hyman Main [id number] Procedure:             Colonoscopy Indications:           Screening for colorectal malignant neoplasm Providers:             Luke Salaam MD, MD Referring MD:          Lethia Raveling. Del Favia MD (Referring MD) Medicines:             Monitored Anesthesia Care Complications:         No immediate complications. Procedure:             Pre-Anesthesia Assessment:                        - Prior to the procedure, a History and Physical was                         performed, and patient medications, allergies and                         sensitivities were reviewed. The patient's tolerance                         of previous anesthesia was reviewed.                        - The risks and benefits of the procedure and the                         sedation options and risks were discussed with the                         patient. All questions were answered and informed                         consent was obtained.                        - ASA Grade Assessment: II - A patient with mild                         systemic disease.                        After obtaining informed consent, the colonoscope was                         passed under direct vision. Throughout the procedure,                         the patient's blood pressure, pulse, and oxygen                         saturations were monitored continuously. The                         Colonoscope was  introduced through the anus and                         advanced to the the cecum, identified by the                         appendiceal orifice. The colonoscopy was performed                         with ease. The patient tolerated the procedure well.                          The quality of the bowel preparation was excellent.                         The ileocecal valve, appendiceal orifice, and rectum                         were photographed. Findings:      The perianal and digital rectal examinations were normal.      The entire examined colon appeared normal on direct and retroflexion       views. Impression:            - The entire examined colon is normal on direct and                         retroflexion views.                        - No specimens collected. Recommendation:        - Discharge patient to home (with escort).                        - Resume previous diet.                        - Continue present medications.                        - Repeat colonoscopy is not recommended due to current                         age (30 years or older) for screening purposes. Procedure Code(s):     --- Professional ---                        [phone], Colonoscopy, flexible; diagnostic, including                         collection of specimen(s) by brushing or washing, when                         performed (separate procedure) Diagnosis Code(s):     --- Professional ---                        Z12.11, Encounter for screening for malignant neoplasm                         of colon CPT copyright 2022 American Medical Association.  All rights reserved. The codes documented in this report are preliminary and upon coder review may  be revised to meet current compliance requirements. Luke Salaam, MD Luke Salaam MD, MD 12/23/2023 1:13:12 PM This report has been signed electronically. Number of Addenda: 0 Note Initiated On: 12/23/2023 12:50 PM Scope Withdrawal Time: 0 hours 8 minutes 24 seconds  Total Procedure Duration: 0 hours 11 minutes 23 seconds  Estimated Blood Loss:  Estimated blood loss: none.      [facility]

## 2023-12-23 NOTE — Anesthesia Preprocedure Evaluation (Signed)
 Anesthesia Evaluation  Patient identified by MRN, date of birth, ID band Patient awake    Reviewed: Allergy & Precautions, NPO status , Patient's Chart, lab work & pertinent test results  Airway Mallampati: III  TM Distance: <3 FB Neck ROM: full    Dental  (+) Chipped   Pulmonary neg pulmonary ROS, neg shortness of breath   Pulmonary exam normal        Cardiovascular Exercise Tolerance: Good hypertension, (-) angina (-) Past MI Normal cardiovascular exam     Neuro/Psych negative neurological ROS  negative psych ROS   GI/Hepatic Neg liver ROS,GERD  Controlled,,  Endo/Other  Hypothyroidism    Renal/GU negative Renal ROS  negative genitourinary   Musculoskeletal   Abdominal   Peds  Hematology negative hematology ROS (+)   Anesthesia Other Findings Past Medical History: No date: Anemia No date: Arthritis     Comment:  oa 08/1989: BPH (benign prostatic hyperplasia) 11/29/1998: GERD (gastroesophageal reflux disease) 08/1997: Hyperlipidemia No date: Hypertension 1983: Hypothyroidism No date: Lesion of bladder 02/28/2021: Lesion of bladder No date: Pre-diabetes No date: Pulmonary nodules No date: Spermatocele     Comment:  per Dr. Inga Manges with URO 02/28/2021: Urinary frequency     Comment:  and buring with urination  Past Surgical History: No date: CATARACT EXTRACTION     Comment:  left eye 03-2003 and right fall 2021 08/10/2009: COLONOSCOPY     Comment:  polyp in ascending colon otherwise normal 10/28/2013: COLONOSCOPY WITH ESOPHAGOGASTRODUODENOSCOPY (EGD); N/A     Comment:  Dr. Rourk:Schatzki's ring - not manipulated because no               dysphagia currently. Small hiatal hernia.  Gastric and               duodenal erosions of uncertain significance-status post               biopsy. path with benign findings, no villous atrophy, no              H.pylori/TCS:Colonic diverticulosis 08/1989: CYSTOSCOPY      Comment:  normal 02/07/2020: CYSTOSCOPY WITH BIOPSY; N/A     Comment:  Procedure: CYSTOSCOPY WITH BLADDER AND PROSTATIS               URETHRAL  BIOPSY WIHT FULGURATION AND INSTILL               GEMCITABINE ;  Surgeon: Homero Luster, MD;  Location: [facility];  Service: Urology;  Laterality: N/A; 08/09/2020: CYSTOSCOPY WITH BIOPSY; Bilateral     Comment:  Procedure: CYSTOSCOPY WITH BLADDER BIOPSYAND FULGURATION              BILATERAL RETROGRADE S;  Surgeon: Homero Luster, MD;                Location: [facility];  Service: Urology;  Laterality:               Bilateral; 03/05/2021: CYSTOSCOPY WITH BIOPSY; N/A     Comment:  Procedure: CYSTOSCOPY WITH BIOPSY FULGURATION;  Surgeon:              Homero Luster, MD;  Location: [facility];                Service: Urology;  Laterality: N/A; 10/1999: ESOPHAGOGASTRODUODENOSCOPY     Comment:  distal esoph. stricture; sliding H. H. 04/12/2003: LASIK  Comment:  right eye 2003: lumbar microdisectomy     Comment:  Dr. Selestino Dakin  BMI    Body Mass Index: 25.37 kg/m      Reproductive/Obstetrics negative OB ROS                             Anesthesia Physical Anesthesia Plan  ASA: 2  Anesthesia Plan: General   Post-op Pain Management:    Induction: Intravenous  PONV Risk Score and Plan: Propofol  infusion and TIVA  Airway Management Planned: Natural Airway and Nasal Cannula  Additional Equipment:   Intra-op Plan:   Post-operative Plan:   Informed Consent: I have reviewed the patients History and Physical, chart, labs and discussed the procedure including the risks, benefits and alternatives for the proposed anesthesia with the patient or authorized representative who has indicated his/her understanding and acceptance.     Dental Advisory Given  Plan Discussed with: Anesthesiologist, CRNA and Surgeon  Anesthesia Plan Comments: (Patient consented for risks of anesthesia  including but not limited to:  - adverse reactions to medications - risk of airway placement if required - damage to eyes, teeth, lips or other oral mucosa - nerve damage due to positioning  - sore throat or hoarseness - Damage to heart, brain, nerves, lungs, other parts of body or loss of life  Patient voiced understanding and assent.)       Anesthesia Quick Evaluation

## 2023-12-23 NOTE — Anesthesia Procedure Notes (Signed)
 Procedure Name: MAC Date/Time: 12/23/2023 12:55 PM  Performed by: Orin Birk, CRNAPre-anesthesia Checklist: Patient identified, Emergency Drugs available, Suction available and Patient being monitored Patient Re-evaluated:Patient Re-evaluated prior to induction Oxygen Delivery Method: Nasal cannula

## 2023-12-23 NOTE — H&P (Signed)
 Luke Salaam, MD [address], [address], [place], [place], [address] [address], [place], [place], [address] Phone: [phone]  Fax: [phone]  Primary Care Physician:  Omie Bickers, MD   Pre-Procedure History & Physical: HPI:  MAC DOWDELL is a 68 y.o. male is here for an colonoscopy.   Past Medical History:  Diagnosis Date   Anemia    Arthritis    oa   BPH (benign prostatic hyperplasia) 08/1989   GERD (gastroesophageal reflux disease) 11/29/1998   Hyperlipidemia 08/1997   Hypertension    Hypothyroidism 1983   Lesion of bladder    Lesion of bladder 02/28/2021   Pre-diabetes    Pulmonary nodules    Spermatocele    per Dr. Inga Manges with URO   Urinary frequency 02/28/2021   and buring with urination    Past Surgical History:  Procedure Laterality Date   CATARACT EXTRACTION     left eye 03-2003 and right fall 2021   COLONOSCOPY  08/10/2009   polyp in ascending colon otherwise normal   COLONOSCOPY WITH ESOPHAGOGASTRODUODENOSCOPY (EGD) N/A 10/28/2013   Dr. Rourk:Schatzki's ring - not manipulated because no dysphagia currently. Small hiatal hernia.  Gastric and duodenal erosions of uncertain significance-status post biopsy. path with benign findings, no villous atrophy, no H.pylori/TCS:Colonic diverticulosis   CYSTOSCOPY  08/1989   normal   CYSTOSCOPY WITH BIOPSY N/A 02/07/2020   Procedure: CYSTOSCOPY WITH BLADDER AND PROSTATIS URETHRAL  BIOPSY WIHT FULGURATION AND INSTILL GEMCITABINE ;  Surgeon: Homero Luster, MD;  Location: [facility];  Service: Urology;  Laterality: N/A;   CYSTOSCOPY WITH BIOPSY Bilateral 08/09/2020   Procedure: CYSTOSCOPY WITH BLADDER BIOPSYAND FULGURATION BILATERAL RETROGRADE S;  Surgeon: Homero Luster, MD;  Location: [facility];  Service: Urology;  Laterality: Bilateral;   CYSTOSCOPY WITH BIOPSY N/A 03/05/2021   Procedure: CYSTOSCOPY WITH BIOPSY FULGURATION;  Surgeon: Homero Luster, MD;  Location: [facility];   Service: Urology;  Laterality: N/A;   ESOPHAGOGASTRODUODENOSCOPY  10/1999   distal esoph. stricture; sliding H. H.   LASIK  04/12/2003   right eye   lumbar microdisectomy  2003   Dr. Selestino Dakin    Prior to Admission medications   Medication Sig Start Date End Date Taking? Authorizing Provider  b complex vitamins tablet Take 1 tablet by mouth daily.   Yes [provider]  Cholecalciferol (VITAMIN D3) 2000 UNITS TABS Take 2,000 Units by mouth daily.   Yes [provider]  Coenzyme Q10 200 MG capsule Take 200 mg by mouth daily.   Yes [provider]  gabapentin (NEURONTIN) 100 MG capsule Take 100 mg by mouth at bedtime as needed.   Yes [provider]  levothyroxine (SYNTHROID) 88 MCG tablet Take 88 mcg by mouth daily before breakfast.   Yes [provider]  loratadine (CLARITIN) 10 MG tablet Take 10 mg by mouth daily.   Yes [provider]  losartan (COZAAR) 100 MG tablet  09/22/23  Yes [provider]  meloxicam (MOBIC) 15 MG tablet Take 15 mg by mouth daily as needed for pain.  06/22/17  Yes [provider]  Omega-3 1400 MG CAPS    Yes [provider]  pantoprazole  (PROTONIX ) 40 MG tablet Take 40 mg by mouth daily.   Yes Emi Hanson, MD  Turmeric Curcumin 500 MG CAPS    Yes [provider]  ZOCOR  40 MG tablet TAKE ONE TABLET BY MOUTH IN THE EVENING. 02/13/12  Yes Vallarie Gauze,  Gwynda Leriche, MD  fluocinonide gel (LIDEX) 0.05 %  07/07/22   [provider]  traMADol (ULTRAM) 50 MG tablet Take 50 mg by mouth every 6 (six) hours as needed for moderate pain.     [provider]    Allergies as of 12/01/2023 - Review Complete 09/01/2023  Allergen Reaction Noted   Cephalexin Rash 01/15/2007   Penicillins Rash 01/15/2007   Sulfonamide derivatives Rash 01/15/2007    Family History  Problem Relation Age of Onset   Hypertension Mother    Hyperlipidemia Mother    Diabetes Mother 51   Heart  disease Father        CABG X 4  12/2001   Hyperlipidemia Father    Hypertension Father    Heart disease Brother        CABG X 1   Heart disease Paternal Uncle        MI   Cancer Maternal Grandfather        colon   Diabetes Maternal Uncle    Arthritis Maternal Uncle    Stroke Paternal Grandfather    Allergic rhinitis Neg Hx    Angioedema Neg Hx    Asthma Neg Hx    Atopy Neg Hx    Eczema Neg Hx    Immunodeficiency Neg Hx    Urticaria Neg Hx     Social History   Socioeconomic History   Marital status: Married    Spouse name: Not on file   Number of children: 1   Years of education: Not on file   Highest education level: Not on file  Occupational History   Occupation: Sports administrator: [employer]    Comment: Engineer, water  Tobacco Use   Smoking status: Never   Smokeless tobacco: Never  Vaping Use   Vaping status: Never Used  Substance and Sexual Activity   Alcohol use: No   Drug use: No   Sexual activity: Yes  Other Topics Concern   Not on file  Social History Narrative   Married and lives with wife   Social Drivers of Corporate investment banker Strain: Not on file  Food Insecurity: Not on file  Transportation Needs: Not on file  Physical Activity: Not on file  Stress: Not on file  Social Connections: Not on file  Intimate Partner Violence: Not on file    Review of Systems: See HPI, otherwise negative ROS  Physical Exam: BP (!) 142/89   Pulse 85   Temp (!) 97.1 F (36.2 C) (Temporal)   Resp 20   Ht 5\' 10"  (1.778 m)   Wt 80.2 kg   SpO2 98%   BMI 25.37 kg/m  General:   Alert,  pleasant and cooperative in NAD Head:  Normocephalic and atraumatic. Neck:  Supple; no masses or thyromegaly. Lungs:  Clear throughout to auscultation, normal respiratory effort.    Heart:  +S1, +S2, Regular rate and rhythm, No edema. Abdomen:  Soft, nontender and nondistended. Normal bowel sounds, without guarding, and without rebound.   Neurologic:   Alert and  oriented x4;  grossly normal neurologically.  Impression/Plan: URIEL DOWDING is here for an colonoscopy to be performed for Screening colonoscopy average risk   Risks, benefits, limitations, and alternatives regarding  colonoscopy have been reviewed with the patient.  Questions have been answered.  All parties agreeable.   Luke Salaam, MD  12/23/2023, 12:33 PM

## 2023-12-24 ENCOUNTER — Encounter: Payer: Self-pay | Admitting: Gastroenterology

## 2023-12-24 NOTE — Anesthesia Postprocedure Evaluation (Signed)
 Anesthesia Post Note  Patient: Walter Gunning  Procedure(s) Performed: COLONOSCOPY  Patient location during evaluation: Endoscopy Anesthesia Type: General Level of consciousness: awake and alert Pain management: pain level controlled Vital Signs Assessment: post-procedure vital signs reviewed and stable Respiratory status: spontaneous breathing, nonlabored ventilation, respiratory function stable and patient connected to nasal cannula oxygen Cardiovascular status: blood pressure returned to baseline and stable Postop Assessment: no apparent nausea or vomiting Anesthetic complications: no   No notable events documented.   Last Vitals:  Vitals:   12/23/23 1327 12/23/23 1336  BP: 94/69 114/71  Pulse: 79 72  Resp: 19 18  Temp:    SpO2: 95% 95%    Last Pain:  Vitals:   12/24/23 0734  TempSrc:   PainSc: 0-No pain                 Portia Brittle Frayda Egley

## 2023-12-31 DIAGNOSIS — E119 Type 2 diabetes mellitus without complications: Secondary | ICD-10-CM | POA: Diagnosis not present

## 2024-01-21 DIAGNOSIS — E782 Mixed hyperlipidemia: Secondary | ICD-10-CM | POA: Diagnosis not present

## 2024-01-21 DIAGNOSIS — E1122 Type 2 diabetes mellitus with diabetic chronic kidney disease: Secondary | ICD-10-CM | POA: Diagnosis not present

## 2024-01-21 DIAGNOSIS — Z125 Encounter for screening for malignant neoplasm of prostate: Secondary | ICD-10-CM | POA: Diagnosis not present

## 2024-01-21 DIAGNOSIS — E039 Hypothyroidism, unspecified: Secondary | ICD-10-CM | POA: Diagnosis not present

## 2024-01-31 DIAGNOSIS — E119 Type 2 diabetes mellitus without complications: Secondary | ICD-10-CM | POA: Diagnosis not present

## 2024-02-01 DIAGNOSIS — E782 Mixed hyperlipidemia: Secondary | ICD-10-CM | POA: Diagnosis not present

## 2024-02-01 DIAGNOSIS — D509 Iron deficiency anemia, unspecified: Secondary | ICD-10-CM | POA: Diagnosis not present

## 2024-02-01 DIAGNOSIS — N1831 Chronic kidney disease, stage 3a: Secondary | ICD-10-CM | POA: Diagnosis not present

## 2024-02-01 DIAGNOSIS — I129 Hypertensive chronic kidney disease with stage 1 through stage 4 chronic kidney disease, or unspecified chronic kidney disease: Secondary | ICD-10-CM | POA: Diagnosis not present

## 2024-02-01 DIAGNOSIS — Z23 Encounter for immunization: Secondary | ICD-10-CM | POA: Diagnosis not present

## 2024-02-01 DIAGNOSIS — I10 Essential (primary) hypertension: Secondary | ICD-10-CM | POA: Diagnosis not present

## 2024-02-01 DIAGNOSIS — E1122 Type 2 diabetes mellitus with diabetic chronic kidney disease: Secondary | ICD-10-CM | POA: Diagnosis not present

## 2024-02-17 DIAGNOSIS — K080 Exfoliation of teeth due to systemic causes: Secondary | ICD-10-CM | POA: Diagnosis not present

## 2024-03-01 DIAGNOSIS — E119 Type 2 diabetes mellitus without complications: Secondary | ICD-10-CM | POA: Diagnosis not present

## 2024-04-01 DIAGNOSIS — E119 Type 2 diabetes mellitus without complications: Secondary | ICD-10-CM | POA: Diagnosis not present

## 2024-05-02 DIAGNOSIS — E119 Type 2 diabetes mellitus without complications: Secondary | ICD-10-CM | POA: Diagnosis not present

## 2024-05-30 DIAGNOSIS — E1122 Type 2 diabetes mellitus with diabetic chronic kidney disease: Secondary | ICD-10-CM | POA: Diagnosis not present

## 2024-05-30 DIAGNOSIS — E782 Mixed hyperlipidemia: Secondary | ICD-10-CM | POA: Diagnosis not present

## 2024-05-30 DIAGNOSIS — E039 Hypothyroidism, unspecified: Secondary | ICD-10-CM | POA: Diagnosis not present

## 2024-06-01 DIAGNOSIS — E119 Type 2 diabetes mellitus without complications: Secondary | ICD-10-CM | POA: Diagnosis not present

## 2024-06-03 DIAGNOSIS — Z0000 Encounter for general adult medical examination without abnormal findings: Secondary | ICD-10-CM | POA: Diagnosis not present

## 2024-06-03 DIAGNOSIS — Z23 Encounter for immunization: Secondary | ICD-10-CM | POA: Diagnosis not present

## 2024-06-03 DIAGNOSIS — D509 Iron deficiency anemia, unspecified: Secondary | ICD-10-CM | POA: Diagnosis not present

## 2024-06-03 DIAGNOSIS — M549 Dorsalgia, unspecified: Secondary | ICD-10-CM | POA: Diagnosis not present

## 2024-06-03 DIAGNOSIS — G8929 Other chronic pain: Secondary | ICD-10-CM | POA: Diagnosis not present

## 2024-06-09 ENCOUNTER — Other Ambulatory Visit: Payer: Medicare Other | Admitting: Urology

## 2024-07-02 DIAGNOSIS — E119 Type 2 diabetes mellitus without complications: Secondary | ICD-10-CM | POA: Diagnosis not present

## 2024-07-12 ENCOUNTER — Encounter: Payer: Self-pay | Admitting: Internal Medicine

## 2024-07-12 ENCOUNTER — Ambulatory Visit: Admitting: Internal Medicine

## 2024-07-12 ENCOUNTER — Ambulatory Visit (HOSPITAL_COMMUNITY)
Admission: RE | Admit: 2024-07-12 | Discharge: 2024-07-12 | Disposition: A | Discharge status: Home / Self Care | Source: Ambulatory Visit | Attending: Internal Medicine | Admitting: Internal Medicine

## 2024-07-12 VITALS — BP 125/76 | HR 67 | Ht 70.0 in | Wt 183.6 lb

## 2024-07-12 DIAGNOSIS — R0609 Other forms of dyspnea: Secondary | ICD-10-CM | POA: Diagnosis not present

## 2024-07-12 DIAGNOSIS — R918 Other nonspecific abnormal finding of lung field: Secondary | ICD-10-CM | POA: Diagnosis not present

## 2024-07-12 DIAGNOSIS — J310 Chronic rhinitis: Secondary | ICD-10-CM

## 2024-07-12 NOTE — Assessment & Plan Note (Signed)
 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 slt progression  - cxr 07/12/2024 no change from priors   No evidence of significant sarcoid activity or secondary cause for adenopathy by plain cxr which, in asymptomatic pt, is probably more helpful/ cost effective than serial ct   Discussed in detail all the  indications, usual  risks and alternatives  relative to the benefits with patient who agrees to continue with conservative f/u as outlined  below

## 2024-07-12 NOTE — Progress Notes (Signed)
 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 No new co's.    Dyspnea:  Ex x treadmill x 1.5 mph on grade ? % x 15 min daily s sob  Cough: none Sleeping: no resp flat / on side  SABA use: no 02: no Covid status: vax x 3  Lung cancer screening: na  Rec No change in recommendations but try to build up to 30 min of exercise  lab esr 25/ ACE  64 x-ray cxr s change        05/26/2022  yearly  f/u ov/[place] office/Lazariah Savard re: MPNs / cough /nasal congestion q noct x years  Chief Complaint  Patient presents with   Follow-up    Patient doing well since last ov no new concerns   Dyspnea:  no doe doing step master x 30-40 min  Cough: dry / sporadic no obvious triggers other than smells  Sleeping: nasal strips helps, sometimes benadryl  SABA use: none  02: none  Rec referral to ENT in [place]  >>> 08/05/22 [facility] eval:  Findings: Nasal  septal deviation and spurring. No purulent mucus. No inflammatory or granulomatous changes.     09/01/2023 yearly  f/u ov/[place] office/Jaziah Goeller re: mpns/doe ? Sarcoid  maint on no meds  Chief Complaint  Patient presents with   Follow-up  Dyspnea:  not limited, getting PT for back problems  Cough: nose drips and makes him cough  as does perfume /flowers  Sleeping: flat bed/ one pillow occ needs cough drop at hs   SABA use: none  02: none  Rec For drainage / throat tickle stop the loratidine  try take CHLORPHENIRAMINE  4 mg     Remind your eye doctor you have sarcoid    07/12/2024 yearly  f/u ov/[place] office/Recia Sons re: ?sacroidosis  maint on   Chief Complaint  Patient presents with   Cough    Doe   Dyspnea:  using stair stepper at home does not make him sob medium resistance but sob cranking mower bending over  Cough: attribute to pnds on prn clariton daytime and h1 hs prn / also saline rinse comes out greenish  Sleeping: flat bed / one pillow s    resp cc  SABA use: none  02: none      No obvious day to day or daytime variability or assoc excess/ purulent sputum or mucus plugs or hemoptysis or cp or chest tightness, subjective wheeze or overt  hb symptoms.    Also denies any obvious fluctuation of symptoms with weather or environmental changes or other aggravating or alleviating factors except as outlined above   No unusual exposure hx or h/o childhood pna/ asthma or knowledge of premature birth.  Current Allergies, Complete Past Medical History, Past Surgical History, Family History, and Social History were reviewed in [employer] record.  ROS  The following are not active complaints unless bolded Hoarseness, sore throat, dysphagia, dental problems, itching, sneezing,  nasal congestion or discharge of excess mucus or purulent secretions, ear ache,   fever, chills, sweats, unintended wt loss or wt gain, classically pleuritic or exertional cp,   orthopnea pnd or arm/hand swelling  or leg swelling, presyncope, palpitations, abdominal pain, anorexia, nausea, vomiting, diarrhea  or change in bowel habits or change in bladder habits, change in stools or change in urine, dysuria, hematuria,  rash, arthralgias, visual complaints, headache, numbness, weakness or ataxia or problems with walking or coordination,  change in mood or  memory.        Current Meds  Medication Sig   b complex vitamins tablet Take 1 tablet by mouth daily.   Cholecalciferol (VITAMIN D3) 2000 UNITS TABS Take 2,000 Units by mouth daily.   Coenzyme Q10 200 MG capsule Take 200 mg by mouth daily.   fluocinonide gel (LIDEX) 0.05 %    gabapentin (NEURONTIN) 100 MG capsule Take 100 mg by mouth at bedtime as needed.   levothyroxine (SYNTHROID) 88 MCG tablet Take 88 mcg by mouth daily before breakfast.   loratadine (CLARITIN) 10 MG tablet Take 10 mg by mouth daily.   losartan (COZAAR) 100 MG tablet    meloxicam (MOBIC) 15 MG tablet Take 15 mg by mouth daily as needed for pain.    Omega-3 1400 MG CAPS    pantoprazole  (PROTONIX ) 40 MG tablet Take 40 mg by mouth daily.   traMADol (ULTRAM) 50 MG tablet Take 50 mg by mouth every 6 (six) hours as needed for moderate pain.    Turmeric Curcumin 500 MG CAPS    ZOCOR  40 MG tablet TAKE ONE TABLET BY MOUTH IN THE EVENING.                 Past Medical History:  Diagnosis Date   Arthritis    BPH (benign prostatic hyperplasia) 08/1989   GERD (gastroesophageal reflux disease) 11/29/1998   Hyperlipidemia 08/1997   Hypertension    Hypothyroidism 1983   Lesion of bladder    Spermatocele    per Dr. Watt with URO       Objective:     Wts  07/12/2024      183   09/01/2023      187  10/27/2021       189  05/26/2022       185  05/23/2021       182   10/25/20 189 lb 6.4 oz (85.9 kg)  09/06/20 188 lb (85.3 kg)  08/09/20 187 lb (84.8 kg)     Vital signs reviewed  07/12/2024  - Note at rest 02 sats  96% on RA   General  appearance:    somber/ stoic  amb wm   nad     HEENT : Oropharynx  clear/ M  3 airway     Nasal turbinates mild non specific edema R>L    NECK :  without  apparent JVD/ palpable Nodes/TM    LUNGS: no acc muscle use,  Nl contour chest which is clear to A and P bilaterally without cough on insp or exp maneuvers   CV:  RRR  no s3 or murmur or increase in P2, and no edema   ABD:  soft and nontender   MS:  Gait nl   ext warm without deformities Or obvious joint restrictions  calf tenderness, cyanosis or clubbing    SKIN: warm and dry without lesions    NEURO:  alert, approp, nl sensorium with  no motor or cerebellar deficits apparent.     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 slt progression  - cxr 07/12/2024 no change from priors   No evidence of significant sarcoid activity or secondary cause for adenopathy by plain cxr which, in asymptomatic pt, is probably more helpful/ cost effective than serial ct   Discussed in detail all the  indications, usual  risks and alternatives  relative to the benefits with patient who agrees to continue with conservative f/u as outlined  below   DOE (dyspnea on exertion) Onset 2014 with cpst 08/31/13  88% of nl with no cardiopulmonary limitation  - 04/10/2020 no change ex tol since 2014  - 07/12/2024   Walked on RA   x    lap(s) =  approx 450  ft  @ mod to fast  pace, stopped due to end of study  with lowest 02 sats 93% and no sob / cp   Rhinitis, chronic Referred to ENT 05/26/2022 >>> - 08/05/22 [facility] eval:  Findings: Nasal septal  deviation and spurring. No purulent mucus. No inflammatory or granulomatous changes. Rec ns irrigation  -  09/01/23  added H1 HS > improved noct symptoms   Each maintenance medication was reviewed in detail including emphasizing most importantly the difference between maintenance and prns and under what circumstances the prns are to be triggered using an action plan format where appropriate.  Total time for H and P, chart review, counseling,  directly observing portions of ambulatory 02 saturation study/ and generating customized AVS unique to this office visit / same day charting = 35 min yearly sarcoid f/u                   AVS  Patient Instructions  Please remember to go to the  x-ray department  @  [facility] for your tests - we will call you with the results when they are available      Please schedule a follow up visit in 12  months but call sooner if needed    Isaac America, MD 07/12/2024

## 2024-07-12 NOTE — Patient Instructions (Addendum)
 Please remember to go to the  x-ray department  @  [facility] for your tests - we will call you with the results when they are available     Please schedule a follow up visit in 12  months but call sooner if needed

## 2024-07-12 NOTE — Assessment & Plan Note (Addendum)
 Onset 2014 with cpst 08/31/13  88% of nl with no cardiopulmonary limitation  - 04/10/2020 no change ex tol since 2014  - 07/12/2024   Walked on RA   x    lap(s) =  approx 450  ft  @ mod to fast  pace, stopped due to end of study  with lowest 02 sats 93% and no sob / cp

## 2024-07-12 NOTE — Assessment & Plan Note (Addendum)
 Referred to ENT 05/26/2022 >>> - 08/05/22 [facility] eval:  Findings: Nasal septal deviation and spurring. No purulent mucus. No inflammatory or granulomatous changes. Rec ns irrigation  -  09/01/23  added H1 HS > improved noct symptoms   Each maintenance medication was reviewed in detail including emphasizing most importantly the difference between maintenance and prns and under what circumstances the prns are to be triggered using an action plan format where appropriate.  Total time for H and P, chart review, counseling,  directly observing portions of ambulatory 02 saturation study/ and generating customized AVS unique to this office visit / same day charting = 35 min yearly sarcoid f/u

## 2024-07-13 DIAGNOSIS — L821 Other seborrheic keratosis: Secondary | ICD-10-CM | POA: Diagnosis not present

## 2024-07-13 DIAGNOSIS — R208 Other disturbances of skin sensation: Secondary | ICD-10-CM | POA: Diagnosis not present

## 2024-07-13 DIAGNOSIS — D2272 Melanocytic nevi of left lower limb, including hip: Secondary | ICD-10-CM | POA: Diagnosis not present

## 2024-07-13 DIAGNOSIS — D2262 Melanocytic nevi of left upper limb, including shoulder: Secondary | ICD-10-CM | POA: Diagnosis not present

## 2024-07-13 DIAGNOSIS — D2261 Melanocytic nevi of right upper limb, including shoulder: Secondary | ICD-10-CM | POA: Diagnosis not present

## 2024-07-13 DIAGNOSIS — D225 Melanocytic nevi of trunk: Secondary | ICD-10-CM | POA: Diagnosis not present

## 2024-07-13 DIAGNOSIS — L820 Inflamed seborrheic keratosis: Secondary | ICD-10-CM | POA: Diagnosis not present

## 2024-07-13 DIAGNOSIS — L905 Scar conditions and fibrosis of skin: Secondary | ICD-10-CM | POA: Diagnosis not present

## 2024-07-13 DIAGNOSIS — L570 Actinic keratosis: Secondary | ICD-10-CM | POA: Diagnosis not present

## 2024-07-13 DIAGNOSIS — D485 Neoplasm of uncertain behavior of skin: Secondary | ICD-10-CM | POA: Diagnosis not present

## 2024-07-14 ENCOUNTER — Ambulatory Visit: Payer: Self-pay | Admitting: Internal Medicine

## 2024-07-15 NOTE — Progress Notes (Signed)
 Called and relayed results to pt and pt confirmed understanding

## 2024-08-15 ENCOUNTER — Ambulatory Visit: Admitting: Urology

## 2024-08-15 VITALS — BP 117/71 | HR 73

## 2024-08-15 DIAGNOSIS — Z08 Encounter for follow-up examination after completed treatment for malignant neoplasm: Secondary | ICD-10-CM | POA: Diagnosis not present

## 2024-08-15 DIAGNOSIS — Z8551 Personal history of malignant neoplasm of bladder: Secondary | ICD-10-CM | POA: Diagnosis not present

## 2024-08-15 LAB — URINALYSIS, ROUTINE W REFLEX MICROSCOPIC
Bilirubin, UA: NEGATIVE
Glucose, UA: NEGATIVE
Ketones, UA: NEGATIVE
Leukocytes,UA: NEGATIVE
Nitrite, UA: NEGATIVE
Protein,UA: NEGATIVE
Specific Gravity, UA: 1.02 (ref 1.005–1.030)
Urobilinogen, Ur: 0.2 mg/dL (ref 0.2–1.0)
pH, UA: 6 (ref 5.0–7.5)

## 2024-08-15 LAB — MICROSCOPIC EXAMINATION: Bacteria, UA: NONE SEEN

## 2024-08-15 MED ORDER — CIPROFLOXACIN HCL 500 MG PO TABS
500.0000 mg | ORAL_TABLET | Freq: Once | ORAL | Status: AC
  Administered 2024-08-15: 11:00:00 500 mg via ORAL

## 2024-08-15 NOTE — Progress Notes (Signed)
°  [place]  08/15/2024  CC: No chief complaint on file.   HPI:  New patient for me-  1) history of bladder cancer-history of CIS.  He was treated with induction BCG.  He was treated with a second induction course completed May 2022.  He had persistent irritative symptoms so maintenance therapy was not felt to be appropriate.  August 2023 CT hematuria benign.  He has October 2024 cystoscopy demonstrated a right posterior wall scar.  There is a persistent lesion on the left anterior and lateral wall with some inflammatory exudate and persistent erythema but it appears smaller than on the previous cysto. The lesion is about 2-82mm along a trabecular band.   This was inflammatory on prior biopsy.  October 2024 cytology was atypical.  2) ED-noted by 2023.  Tried Viagra and Cialis in the past.  Today, seen for the above.    There were no vitals taken for this visit. NED. A&Ox3.   No respiratory distress   Abd soft, NT, ND Normal phallus with bilateral descended testicles  Cystoscopy Procedure Note  Patient identification was confirmed, informed consent was obtained, and patient was prepped using Betadine solution.  Lidocaine  jelly was administered per urethral meatus.     Pre-Procedure: - Inspection reveals a normal caliber ureteral meatus.  Procedure: The flexible cystoscope was introduced without difficulty - No urethral strictures/lesions are present. - prostate borderline obstructing lateral lobes - Normal bladder neck - Bilateral ureteral orifices identified - Bladder mucosa  reveals no ulcers, tumors, or lesions.  Right posterior scar is clean.  Left anterior lateral scar appears benign without erythematous areas.   - No bladder stones - No trabeculation  Retroflexion shows normal bladder neck.   Post-Procedure: - Patient tolerated the procedure well  Assessment/ Plan:  History of CIS-given this is the first time I have looked in his bladder, I would think it looked  improved compared to the October 2024 description.  I will send urine cytology and check him 1 more time in 6 months.  No follow-ups on file.  Donnice Brooks, MD

## 2024-08-16 ENCOUNTER — Ambulatory Visit: Payer: Self-pay

## 2024-08-16 LAB — CYTOLOGY, URINE

## 2025-02-13 ENCOUNTER — Other Ambulatory Visit: Admitting: Urology
